# Patient Record
Sex: Male | Born: 1995 | Hispanic: Yes | Marital: Married | Smoking: Never smoker
Health system: Southern US, Community
[De-identification: ages and names within clinical notes are randomized; demographics above are authoritative.]

## PROBLEM LIST (undated history)

## (undated) DIAGNOSIS — I82409 Acute embolism and thrombosis of unspecified deep veins of unspecified lower extremity: Secondary | ICD-10-CM

---

## 2021-05-31 ENCOUNTER — Inpatient Hospital Stay (HOSPITAL_COMMUNITY)
Admission: EM | Admit: 2021-05-31 | Discharge: 2021-06-07 | DRG: 914 | Disposition: A | Discharge status: Home Health | Payer: PRIVATE HEALTH INSURANCE | Attending: Orthopedic Surgery | Admitting: Orthopedic Surgery

## 2021-05-31 ENCOUNTER — Observation Stay (HOSPITAL_COMMUNITY): Payer: PRIVATE HEALTH INSURANCE

## 2021-05-31 ENCOUNTER — Encounter (HOSPITAL_COMMUNITY): Payer: Self-pay

## 2021-05-31 ENCOUNTER — Other Ambulatory Visit: Payer: Self-pay

## 2021-05-31 ENCOUNTER — Emergency Department (HOSPITAL_COMMUNITY): Payer: PRIVATE HEALTH INSURANCE

## 2021-05-31 DIAGNOSIS — Y990 Civilian activity done for income or pay: Secondary | ICD-10-CM

## 2021-05-31 DIAGNOSIS — D62 Acute posthemorrhagic anemia: Secondary | ICD-10-CM | POA: Diagnosis present

## 2021-05-31 DIAGNOSIS — M79659 Pain in unspecified thigh: Secondary | ICD-10-CM | POA: Diagnosis not present

## 2021-05-31 DIAGNOSIS — M6282 Rhabdomyolysis: Secondary | ICD-10-CM | POA: Diagnosis present

## 2021-05-31 DIAGNOSIS — S7712XA Crushing injury of left thigh, initial encounter: Secondary | ICD-10-CM | POA: Diagnosis not present

## 2021-05-31 DIAGNOSIS — S7012XA Contusion of left thigh, initial encounter: Secondary | ICD-10-CM | POA: Diagnosis present

## 2021-05-31 DIAGNOSIS — Z20822 Contact with and (suspected) exposure to covid-19: Secondary | ICD-10-CM | POA: Diagnosis present

## 2021-05-31 DIAGNOSIS — S7711XA Crushing injury of right thigh, initial encounter: Secondary | ICD-10-CM | POA: Diagnosis present

## 2021-05-31 DIAGNOSIS — S7011XA Contusion of right thigh, initial encounter: Secondary | ICD-10-CM | POA: Diagnosis present

## 2021-05-31 LAB — PROTIME-INR
INR: 1 (ref 0.8–1.2)
Prothrombin Time: 13.1 seconds (ref 11.4–15.2)

## 2021-05-31 LAB — COMPREHENSIVE METABOLIC PANEL
ALT: 27 U/L (ref 0–44)
AST: 26 U/L (ref 15–41)
Albumin: 3.6 g/dL (ref 3.5–5.0)
Alkaline Phosphatase: 58 U/L (ref 38–126)
Anion gap: 9 (ref 5–15)
BUN: 15 mg/dL (ref 6–20)
CO2: 24 mmol/L (ref 22–32)
Calcium: 8.9 mg/dL (ref 8.9–10.3)
Chloride: 104 mmol/L (ref 98–111)
Creatinine, Ser: 0.92 mg/dL (ref 0.61–1.24)
GFR, Estimated: >60 mL/min (ref >60)
Glucose, Bld: 142 mg/dL — ABNORMAL HIGH (ref 70–99)
Potassium: 3.4 mmol/L — ABNORMAL LOW (ref 3.5–5.1)
Sodium: 137 mmol/L (ref 135–145)
Total Bilirubin: 0.5 mg/dL (ref 0.3–1.2)
Total Protein: 6.9 g/dL (ref 6.5–8.1)

## 2021-05-31 LAB — URINALYSIS, ROUTINE W REFLEX MICROSCOPIC
Bilirubin Urine: NEGATIVE
Glucose, UA: NEGATIVE mg/dL
Hgb urine dipstick: NEGATIVE
Ketones, ur: NEGATIVE mg/dL
Leukocytes,Ua: NEGATIVE
Nitrite: NEGATIVE
Protein, ur: NEGATIVE mg/dL
Specific Gravity, Urine: >1.046 — ABNORMAL HIGH (ref 1.005–1.030)
pH: 5 (ref 5.0–8.0)

## 2021-05-31 LAB — I-STAT CHEM 8, ED
BUN: 17 mg/dL (ref 6–20)
Calcium, Ion: 1.03 mmol/L — ABNORMAL LOW (ref 1.15–1.40)
Chloride: 106 mmol/L (ref 98–111)
Creatinine, Ser: 0.9 mg/dL (ref 0.61–1.24)
Glucose, Bld: 136 mg/dL — ABNORMAL HIGH (ref 70–99)
HCT: 42 % (ref 39.0–52.0)
Hemoglobin: 14.3 g/dL (ref 13.0–17.0)
Potassium: 3.4 mmol/L — ABNORMAL LOW (ref 3.5–5.1)
Sodium: 140 mmol/L (ref 135–145)
TCO2: 24 mmol/L (ref 22–32)

## 2021-05-31 LAB — CBC
HCT: 41.8 % (ref 39.0–52.0)
Hemoglobin: 13.7 g/dL (ref 13.0–17.0)
MCH: 27.1 pg (ref 26.0–34.0)
MCHC: 32.8 g/dL (ref 30.0–36.0)
MCV: 82.6 fL (ref 80.0–100.0)
Platelets: 312 10*3/uL (ref 150–400)
RBC: 5.06 MIL/uL (ref 4.22–5.81)
RDW: 13.3 % (ref 11.5–15.5)
WBC: 16.9 10*3/uL — ABNORMAL HIGH (ref 4.0–10.5)
nRBC: 0 % (ref 0.0–0.2)

## 2021-05-31 LAB — RESP PANEL BY RT-PCR (FLU A&B, COVID) ARPGX2
Influenza A by PCR: NEGATIVE
Influenza B by PCR: NEGATIVE
SARS Coronavirus 2 by RT PCR: NEGATIVE

## 2021-05-31 LAB — LACTIC ACID, PLASMA
Lactic Acid, Venous: 3.2 mmol/L (ref 0.5–1.9)
Lactic Acid, Venous: 3.3 mmol/L (ref 0.5–1.9)
Lactic Acid, Venous: 3.4 mmol/L (ref 0.5–1.9)

## 2021-05-31 LAB — SAMPLE TO BLOOD BANK

## 2021-05-31 LAB — ETHANOL: Alcohol, Ethyl (B): <10 mg/dL (ref <10)

## 2021-05-31 LAB — CK: Total CK: 551 U/L — ABNORMAL HIGH (ref 49–397)
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
## 2021-05-31 IMAGING — DX DG PORTABLE PELVIS
1 series · 1 of 1 positions shown · non-contrast
Comparison: None.

CLINICAL DATA: Trauma, pain

EXAM:
PORTABLE PELVIS 1-2 VIEWS

[pelvis]
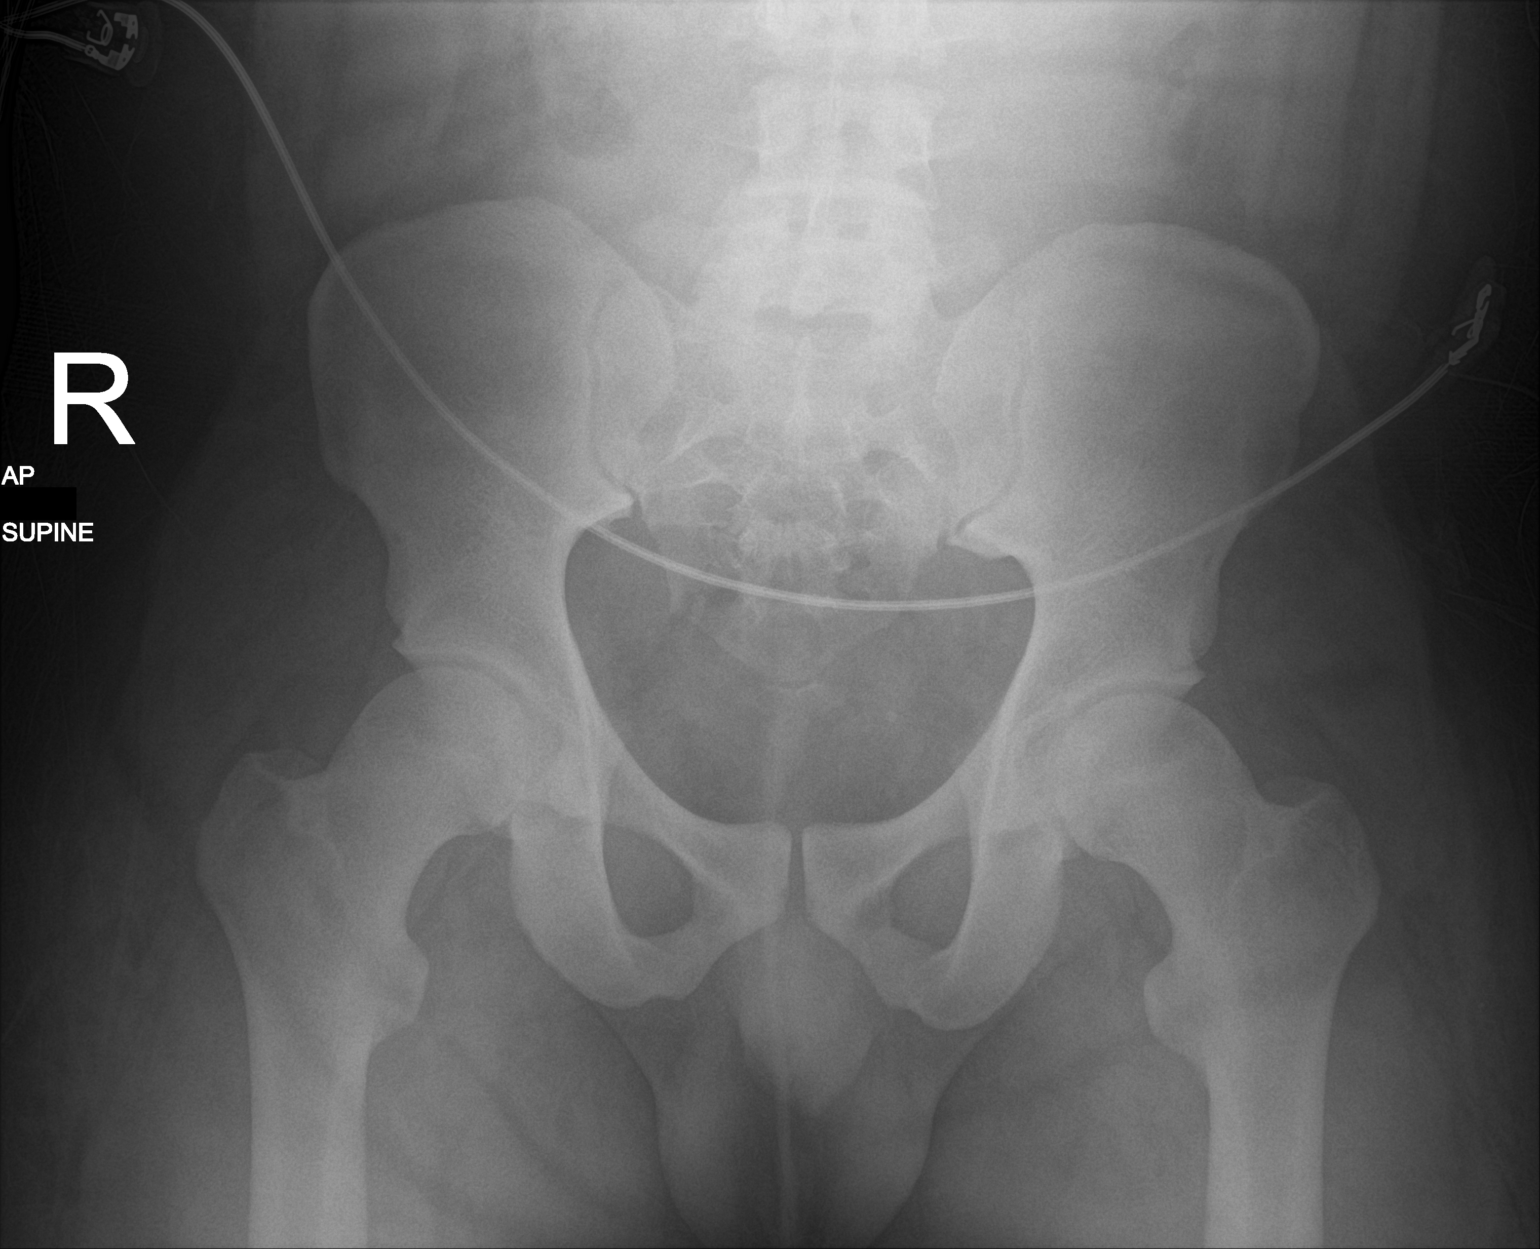

[1 of 1 positions shown; findings below may reference images not displayed]

FINDINGS: There is no evidence of pelvic fracture or diastasis. No pelvic bone
lesions are seen.
IMPRESSION: Negative.

## 2021-05-31 MED ORDER — SODIUM CHLORIDE 0.9 % IV BOLUS
1000.0000 mL | Freq: Once | INTRAVENOUS | Status: AC
  Administered 2021-05-31: 1000 mL via INTRAVENOUS

## 2021-05-31 MED ORDER — METHOCARBAMOL 1000 MG/10ML IJ SOLN
500.0000 mg | Freq: Three times a day (TID) | INTRAVENOUS | Status: DC
  Filled 2021-05-31: qty 5

## 2021-05-31 MED ORDER — TRAMADOL HCL 50 MG PO TABS
50.0000 mg | ORAL_TABLET | Freq: Three times a day (TID) | ORAL | Status: DC | PRN
  Administered 2021-06-02 – 2021-06-03 (×2): 50 mg via ORAL
  Filled 2021-05-31 (×2): qty 1
  Filled 2021-05-31: qty 2
  Filled 2021-05-31: qty 1

## 2021-05-31 MED ORDER — ACETAMINOPHEN 500 MG PO TABS
1000.0000 mg | ORAL_TABLET | Freq: Three times a day (TID) | ORAL | Status: DC
  Administered 2021-05-31 – 2021-06-07 (×16): 1000 mg via ORAL
  Filled 2021-05-31 (×17): qty 2

## 2021-05-31 MED ORDER — POTASSIUM CHLORIDE CRYS ER 20 MEQ PO TBCR
20.0000 meq | EXTENDED_RELEASE_TABLET | Freq: Once | ORAL | Status: AC
  Administered 2021-05-31: 20 meq via ORAL
  Filled 2021-05-31: qty 1

## 2021-05-31 MED ORDER — IOHEXOL 350 MG/ML SOLN
100.0000 mL | Freq: Once | INTRAVENOUS | Status: AC | PRN
  Administered 2021-05-31: 100 mL via INTRAVENOUS

## 2021-05-31 MED ORDER — HYDROCODONE-ACETAMINOPHEN 5-325 MG PO TABS
1.0000 | ORAL_TABLET | Freq: Once | ORAL | Status: AC
  Administered 2021-05-31: 1 via ORAL
  Filled 2021-05-31: qty 1

## 2021-05-31 MED ORDER — PREGABALIN 75 MG PO CAPS
75.0000 mg | ORAL_CAPSULE | Freq: Three times a day (TID) | ORAL | Status: DC
  Administered 2021-05-31 – 2021-06-07 (×19): 75 mg via ORAL
  Filled 2021-05-31 (×9): qty 1
  Filled 2021-05-31: qty 3
  Filled 2021-05-31 (×9): qty 1

## 2021-05-31 MED ORDER — METHOCARBAMOL 500 MG PO TABS
1000.0000 mg | ORAL_TABLET | Freq: Three times a day (TID) | ORAL | Status: DC
  Administered 2021-05-31 – 2021-06-07 (×19): 1000 mg via ORAL
  Filled 2021-05-31 (×19): qty 2

## 2021-05-31 MED ORDER — LACTATED RINGERS IV SOLN: INTRAVENOUS | Status: DC

## 2021-05-31 MED ORDER — FENTANYL CITRATE PF 50 MCG/ML IJ SOSY
100.0000 ug | PREFILLED_SYRINGE | Freq: Once | INTRAMUSCULAR | Status: AC
  Administered 2021-05-31: 100 ug via INTRAVENOUS
  Filled 2021-05-31: qty 2

## 2021-05-31 MED ORDER — HYDROCODONE-ACETAMINOPHEN 5-325 MG PO TABS
1.0000 | ORAL_TABLET | Freq: Four times a day (QID) | ORAL | Status: DC | PRN
  Administered 2021-06-01: 1 via ORAL
  Administered 2021-06-01: 2 via ORAL
  Administered 2021-06-03 – 2021-06-04 (×3): 1 via ORAL
  Administered 2021-06-05 – 2021-06-06 (×2): 2 via ORAL
  Filled 2021-05-31 (×2): qty 2
  Filled 2021-05-31: qty 1
  Filled 2021-05-31 (×3): qty 2
  Filled 2021-05-31 (×4): qty 1

## 2021-05-31 MED ORDER — ACETAMINOPHEN 325 MG PO TABS
325.0000 mg | ORAL_TABLET | Freq: Four times a day (QID) | ORAL | Status: DC | PRN
  Administered 2021-06-03: 650 mg via ORAL
  Filled 2021-05-31: qty 2

## 2021-05-31 MED ORDER — MORPHINE SULFATE (PF) 2 MG/ML IV SOLN
0.5000 mg | INTRAVENOUS | Status: DC | PRN
  Administered 2021-06-01 (×3): 1 mg via INTRAVENOUS
  Filled 2021-05-31 (×3): qty 1

## 2021-05-31 MED ORDER — FENTANYL CITRATE PF 50 MCG/ML IJ SOSY
50.0000 ug | PREFILLED_SYRINGE | Freq: Once | INTRAMUSCULAR | Status: AC
  Administered 2021-05-31: 50 ug via INTRAVENOUS
  Filled 2021-05-31: qty 1

## 2021-05-31 NOTE — ED Notes (Signed)
RN aware of pts BP.  
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
## 2021-05-31 NOTE — ED Notes (Signed)
Pt transported to CT ?

## 2021-05-31 NOTE — ED Notes (Signed)
PA at the bedside notified manual BP 98/72

## 2021-05-31 NOTE — ED Notes (Signed)
Notified PA about pt BP trending low.

## 2021-05-31 NOTE — Progress Notes (Signed)
[facility]  Re-evaluated pt  Overall doing ok  Pain tolerable, about 6/10 on left leg  Last pain meds were about 5 hours ago   Denies numbness or tingling in legs DPN, SPN, TN sensation intact B and symmetric Motor functions intact on R  Pain limits motor eval on L leg but looks to have + quad function (quad set) Unable to perform active hip flexion or SLR No pain out of proportion with passive motion of knee or hip on L   Both thighs are soft  He does appear to have closed degloving injuries to both thighs but soft tissue envelope is stable B and looks healthy  CT L thigh negative for vascular injury, nor is there a large hematoma present. Findings consistent with localized edema/hemorrhage  LA 3.3 CK 551 Bun and Cr: 17 and 0.90 U/A unremarkable  Will admit for pain control and therapies as well as for close monitoring IV fluids given crush injury with elevated CK  MR L thigh to eval for hip flexor and quad injury given mechanism and exam   Ok to eat   Wayne Latin, PA-C [phone] (C) 05/31/2021, 10:29 PM  [facility] [address] [place] [address] [phone] Wayne Hunter (F)

## 2021-05-31 NOTE — ED Notes (Signed)
Help get patient on the monitor did manual blood pressure it was 118/74  patient is resting with nurse at bedside
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
## 2021-05-31 NOTE — ED Notes (Signed)
Katy RN Trauma arrived

## 2021-05-31 NOTE — ED Notes (Signed)
Orthopedic Surgeon PA at the bedside

## 2021-06-01 LAB — RENAL FUNCTION PANEL
Albumin: 3.1 g/dL — ABNORMAL LOW (ref 3.5–5.0)
Anion gap: 7 (ref 5–15)
BUN: 15 mg/dL (ref 6–20)
CO2: 23 mmol/L (ref 22–32)
Calcium: 8.6 mg/dL — ABNORMAL LOW (ref 8.9–10.3)
Chloride: 103 mmol/L (ref 98–111)
Creatinine, Ser: 0.94 mg/dL (ref 0.61–1.24)
GFR, Estimated: >60 mL/min (ref >60)
Glucose, Bld: 149 mg/dL — ABNORMAL HIGH (ref 70–99)
Phosphorus: 3.9 mg/dL (ref 2.5–4.6)
Potassium: 4.1 mmol/L (ref 3.5–5.1)
Sodium: 133 mmol/L — ABNORMAL LOW (ref 135–145)

## 2021-06-01 LAB — CBC
HCT: 32.4 % — ABNORMAL LOW (ref 39.0–52.0)
Hemoglobin: 10.7 g/dL — ABNORMAL LOW (ref 13.0–17.0)
MCH: 27 pg (ref 26.0–34.0)
MCHC: 33 g/dL (ref 30.0–36.0)
MCV: 81.6 fL (ref 80.0–100.0)
Platelets: 206 10*3/uL (ref 150–400)
RBC: 3.97 MIL/uL — ABNORMAL LOW (ref 4.22–5.81)
RDW: 13.6 % (ref 11.5–15.5)
WBC: 9 10*3/uL (ref 4.0–10.5)
nRBC: 0 % (ref 0.0–0.2)

## 2021-06-01 LAB — SURGICAL PCR SCREEN
MRSA, PCR: NEGATIVE
Staphylococcus aureus: NEGATIVE

## 2021-06-01 LAB — CK: Total CK: 3111 U/L — ABNORMAL HIGH (ref 49–397)

## 2021-06-01 LAB — HIV ANTIBODY (ROUTINE TESTING W REFLEX): HIV Screen 4th Generation wRfx: NONREACTIVE

## 2021-06-01 MED ORDER — VITAMIN D 25 MCG (1000 UNIT) PO TABS
2000.0000 [IU] | ORAL_TABLET | Freq: Two times a day (BID) | ORAL | Status: DC
  Administered 2021-06-01 – 2021-06-07 (×12): 2000 [IU] via ORAL
  Filled 2021-06-01 (×11): qty 2

## 2021-06-01 MED ORDER — ASCORBIC ACID 500 MG PO TABS
1000.0000 mg | ORAL_TABLET | Freq: Every day | ORAL | Status: DC
  Administered 2021-06-01 – 2021-06-07 (×7): 1000 mg via ORAL
  Filled 2021-06-01 (×7): qty 2

## 2021-06-01 MED ORDER — CEFAZOLIN SODIUM-DEXTROSE 2-4 GM/100ML-% IV SOLN
2.0000 g | INTRAVENOUS | Status: AC
  Administered 2021-06-02: 2 g via INTRAVENOUS
  Filled 2021-06-01: qty 100

## 2021-06-01 MED ORDER — CALCIUM CITRATE 950 (200 CA) MG PO TABS
200.0000 mg | ORAL_TABLET | Freq: Two times a day (BID) | ORAL | Status: DC
  Administered 2021-06-01 – 2021-06-07 (×11): 200 mg via ORAL
  Filled 2021-06-01 (×14): qty 1
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
## 2021-06-01 NOTE — Progress Notes (Signed)
OT Cancellation Note  Patient Details Name: SOHAIL CAPRARO MRN: [id number] DOB: 18-Jun-1996   Cancelled Treatment:    Reason Eval/Treat Not Completed: Patient not medically ready.  Possible I&D this date with difficulty participating.  OT to hold off for possible surgery, and assess next date.    Nadezhda Pollitt D Maigan Bittinger 06/01/2021, 2:59 PM

## 2021-06-01 NOTE — ED Notes (Signed)
RN attempted to call report to [phone] [phone]

## 2021-06-01 NOTE — Plan of Care (Signed)
  Problem: Clinical Measurements: Goal: Ability to maintain clinical measurements within normal limits will improve Outcome: Progressing Goal: Will remain free from infection Outcome: Progressing Goal: Diagnostic test results will improve Outcome: Progressing Goal: Respiratory complications will improve Outcome: Progressing Goal: Cardiovascular complication will be avoided Outcome: Progressing   Problem: Elimination: Goal: Will not experience complications related to bowel motility Outcome: Progressing Goal: Will not experience complications related to urinary retention Outcome: Progressing   Problem: Pain Managment: Goal: General experience of comfort will improve Outcome: Progressing   Problem: Safety: Goal: Ability to remain free from injury will improve Outcome: Progressing   

## 2021-06-01 NOTE — Progress Notes (Addendum)
Patient made aware he is NPO now. Last food intake 1100. Pain mild, pulses 2+. Patient comfortable at the moment.   *Per patient at 1330: before 0900 he has one sausage and a cup of cranberry juice. Then before 1100 he had chicken noodle soup, half of bread and a sip of juice from [employer] before RN came in to till him he was NPO around 1100.
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
## 2021-06-02 ENCOUNTER — Encounter (HOSPITAL_COMMUNITY)
Admission: EM | Disposition: A | Discharge status: Home Health | Payer: Self-pay | Source: Home / Self Care | Attending: Orthopedic Surgery

## 2021-06-02 ENCOUNTER — Observation Stay (HOSPITAL_COMMUNITY): Payer: PRIVATE HEALTH INSURANCE | Admitting: Certified Registered Nurse Anesthetist

## 2021-06-02 ENCOUNTER — Encounter (HOSPITAL_COMMUNITY): Payer: Self-pay | Admitting: Orthopedic Surgery

## 2021-06-02 DIAGNOSIS — Y990 Civilian activity done for income or pay: Secondary | ICD-10-CM | POA: Diagnosis not present

## 2021-06-02 DIAGNOSIS — S7712XA Crushing injury of left thigh, initial encounter: Secondary | ICD-10-CM | POA: Diagnosis present

## 2021-06-02 DIAGNOSIS — S7711XA Crushing injury of right thigh, initial encounter: Secondary | ICD-10-CM | POA: Diagnosis present

## 2021-06-02 DIAGNOSIS — S7011XA Contusion of right thigh, initial encounter: Secondary | ICD-10-CM | POA: Diagnosis present

## 2021-06-02 DIAGNOSIS — D62 Acute posthemorrhagic anemia: Secondary | ICD-10-CM | POA: Diagnosis not present

## 2021-06-02 DIAGNOSIS — M6282 Rhabdomyolysis: Secondary | ICD-10-CM | POA: Diagnosis present

## 2021-06-02 DIAGNOSIS — Z20822 Contact with and (suspected) exposure to covid-19: Secondary | ICD-10-CM | POA: Diagnosis present

## 2021-06-02 DIAGNOSIS — M79659 Pain in unspecified thigh: Secondary | ICD-10-CM | POA: Diagnosis present

## 2021-06-02 DIAGNOSIS — S7012XA Contusion of left thigh, initial encounter: Secondary | ICD-10-CM | POA: Diagnosis present

## 2021-06-02 HISTORY — PX: I & D EXTREMITY: SHX5045

## 2021-06-02 LAB — URINALYSIS, ROUTINE W REFLEX MICROSCOPIC
Bilirubin Urine: NEGATIVE
Glucose, UA: NEGATIVE mg/dL
Hgb urine dipstick: NEGATIVE
Ketones, ur: NEGATIVE mg/dL
Leukocytes,Ua: NEGATIVE
Nitrite: NEGATIVE
Protein, ur: NEGATIVE mg/dL
Specific Gravity, Urine: 1.021 (ref 1.005–1.030)
pH: 6 (ref 5.0–8.0)

## 2021-06-02 LAB — COMPREHENSIVE METABOLIC PANEL
ALT: 27 U/L (ref 0–44)
AST: 55 U/L — ABNORMAL HIGH (ref 15–41)
Albumin: 2.6 g/dL — ABNORMAL LOW (ref 3.5–5.0)
Alkaline Phosphatase: 44 U/L (ref 38–126)
Anion gap: 5 (ref 5–15)
BUN: 10 mg/dL (ref 6–20)
CO2: 26 mmol/L (ref 22–32)
Calcium: 8.1 mg/dL — ABNORMAL LOW (ref 8.9–10.3)
Chloride: 104 mmol/L (ref 98–111)
Creatinine, Ser: 0.88 mg/dL (ref 0.61–1.24)
GFR, Estimated: >60 mL/min (ref >60)
Glucose, Bld: 107 mg/dL — ABNORMAL HIGH (ref 70–99)
Potassium: 3.8 mmol/L (ref 3.5–5.1)
Sodium: 135 mmol/L (ref 135–145)
Total Bilirubin: 0.5 mg/dL (ref 0.3–1.2)
Total Protein: 5.4 g/dL — ABNORMAL LOW (ref 6.5–8.1)

## 2021-06-02 LAB — MAGNESIUM: Magnesium: 1.7 mg/dL (ref 1.7–2.4)

## 2021-06-02 LAB — CK: Total CK: 3734 U/L — ABNORMAL HIGH (ref 49–397)

## 2021-06-02 LAB — PHOSPHORUS: Phosphorus: 2.7 mg/dL (ref 2.5–4.6)

## 2021-06-02 LAB — LACTATE DEHYDROGENASE: LDH: 98 U/L (ref 98–192)

## 2021-06-02 SURGERY — IRRIGATION AND DEBRIDEMENT EXTREMITY
Anesthesia: General | Site: Leg Upper | Laterality: Bilateral

## 2021-06-02 MED ORDER — OXYCODONE HCL 5 MG/5ML PO SOLN: 5.0000 mg | Freq: Once | ORAL | Status: AC | PRN

## 2021-06-02 MED ORDER — MIDAZOLAM HCL 2 MG/2ML IJ SOLN
INTRAMUSCULAR | Status: DC | PRN
  Administered 2021-06-02: 2 mg via INTRAVENOUS

## 2021-06-02 MED ORDER — PHENYLEPHRINE 40 MCG/ML (10ML) SYRINGE FOR IV PUSH (FOR BLOOD PRESSURE SUPPORT)
PREFILLED_SYRINGE | INTRAVENOUS | Status: DC | PRN
  Administered 2021-06-02: 80 ug via INTRAVENOUS

## 2021-06-02 MED ORDER — ROCURONIUM BROMIDE 10 MG/ML (PF) SYRINGE
PREFILLED_SYRINGE | INTRAVENOUS | Status: DC | PRN
  Administered 2021-06-02: 50 mg via INTRAVENOUS

## 2021-06-02 MED ORDER — 0.9 % SODIUM CHLORIDE (POUR BTL) OPTIME
TOPICAL | Status: DC | PRN
  Administered 2021-06-02: 1000 mL

## 2021-06-02 MED ORDER — FENTANYL CITRATE (PF) 100 MCG/2ML IJ SOLN
INTRAMUSCULAR | Status: AC
  Filled 2021-06-02: qty 2

## 2021-06-02 MED ORDER — FENTANYL CITRATE (PF) 250 MCG/5ML IJ SOLN
INTRAMUSCULAR | Status: AC
  Filled 2021-06-02: qty 5

## 2021-06-02 MED ORDER — CHLORHEXIDINE GLUCONATE 0.12 % MT SOLN: 15.0000 mL | Freq: Once | OROMUCOSAL | Status: AC

## 2021-06-02 MED ORDER — CEFAZOLIN SODIUM-DEXTROSE 2-4 GM/100ML-% IV SOLN
2.0000 g | Freq: Three times a day (TID) | INTRAVENOUS | Status: AC
  Administered 2021-06-02 – 2021-06-03 (×3): 2 g via INTRAVENOUS
  Filled 2021-06-02 (×3): qty 100

## 2021-06-02 MED ORDER — LACTATED RINGERS IV SOLN: INTRAVENOUS | Status: DC | PRN

## 2021-06-02 MED ORDER — MIDAZOLAM HCL 2 MG/2ML IJ SOLN
INTRAMUSCULAR | Status: AC
  Filled 2021-06-02: qty 2

## 2021-06-02 MED ORDER — ONDANSETRON HCL 4 MG/2ML IJ SOLN
INTRAMUSCULAR | Status: AC
  Filled 2021-06-02: qty 2

## 2021-06-02 MED ORDER — ORAL CARE MOUTH RINSE: 15.0000 mL | Freq: Once | OROMUCOSAL | Status: AC

## 2021-06-02 MED ORDER — PROPOFOL 10 MG/ML IV BOLUS
INTRAVENOUS | Status: DC | PRN
  Administered 2021-06-02: 50 mg via INTRAVENOUS
  Administered 2021-06-02: 150 mg via INTRAVENOUS

## 2021-06-02 MED ORDER — LIDOCAINE 2% (20 MG/ML) 5 ML SYRINGE
INTRAMUSCULAR | Status: DC | PRN
  Administered 2021-06-02: 60 mg via INTRAVENOUS

## 2021-06-02 MED ORDER — FENTANYL CITRATE (PF) 100 MCG/2ML IJ SOLN
25.0000 ug | INTRAMUSCULAR | Status: DC | PRN
  Administered 2021-06-02 (×2): 50 ug via INTRAVENOUS

## 2021-06-02 MED ORDER — DEXAMETHASONE SODIUM PHOSPHATE 10 MG/ML IJ SOLN
INTRAMUSCULAR | Status: AC
  Filled 2021-06-02: qty 1

## 2021-06-02 MED ORDER — SUGAMMADEX SODIUM 200 MG/2ML IV SOLN
INTRAVENOUS | Status: DC | PRN
  Administered 2021-06-02: 200 mg via INTRAVENOUS

## 2021-06-02 MED ORDER — KETOROLAC TROMETHAMINE 30 MG/ML IJ SOLN
30.0000 mg | Freq: Once | INTRAMUSCULAR | Status: AC | PRN
  Administered 2021-06-02: 30 mg via INTRAVENOUS

## 2021-06-02 MED ORDER — ROCURONIUM BROMIDE 10 MG/ML (PF) SYRINGE
PREFILLED_SYRINGE | INTRAVENOUS | Status: AC
  Filled 2021-06-02: qty 10

## 2021-06-02 MED ORDER — SODIUM CHLORIDE 0.9 % IR SOLN
Status: DC | PRN
  Administered 2021-06-02: 3000 mL

## 2021-06-02 MED ORDER — OXYCODONE HCL 5 MG PO TABS
5.0000 mg | ORAL_TABLET | Freq: Once | ORAL | Status: AC | PRN
  Administered 2021-06-02: 5 mg via ORAL

## 2021-06-02 MED ORDER — LACTATED RINGERS IV SOLN: INTRAVENOUS | Status: DC

## 2021-06-02 MED ORDER — DEXAMETHASONE SODIUM PHOSPHATE 10 MG/ML IJ SOLN
INTRAMUSCULAR | Status: DC | PRN
  Administered 2021-06-02: 10 mg via INTRAVENOUS

## 2021-06-02 MED ORDER — FENTANYL CITRATE (PF) 250 MCG/5ML IJ SOLN
INTRAMUSCULAR | Status: DC | PRN
  Administered 2021-06-02: 100 ug via INTRAVENOUS
  Administered 2021-06-02 (×3): 50 ug via INTRAVENOUS

## 2021-06-02 MED ORDER — PROMETHAZINE HCL 25 MG/ML IJ SOLN: 6.2500 mg | INTRAMUSCULAR | Status: DC | PRN

## 2021-06-02 MED ORDER — KETOROLAC TROMETHAMINE 30 MG/ML IJ SOLN
INTRAMUSCULAR | Status: AC
  Filled 2021-06-02: qty 1

## 2021-06-02 MED ORDER — AMISULPRIDE (ANTIEMETIC) 5 MG/2ML IV SOLN: 10.0000 mg | Freq: Once | INTRAVENOUS | Status: DC | PRN

## 2021-06-02 MED ORDER — LIDOCAINE 2% (20 MG/ML) 5 ML SYRINGE
INTRAMUSCULAR | Status: AC
  Filled 2021-06-02: qty 5

## 2021-06-02 MED ORDER — PHENYLEPHRINE 40 MCG/ML (10ML) SYRINGE FOR IV PUSH (FOR BLOOD PRESSURE SUPPORT)
PREFILLED_SYRINGE | INTRAVENOUS | Status: AC
  Filled 2021-06-02: qty 10

## 2021-06-02 MED ORDER — PROPOFOL 10 MG/ML IV BOLUS
INTRAVENOUS | Status: AC
  Filled 2021-06-02: qty 40

## 2021-06-02 MED ORDER — OXYCODONE HCL 5 MG PO TABS
ORAL_TABLET | ORAL | Status: AC
  Filled 2021-06-02: qty 1

## 2021-06-02 MED ORDER — CHLORHEXIDINE GLUCONATE 0.12 % MT SOLN
OROMUCOSAL | Status: AC
  Administered 2021-06-02: 15 mL via OROMUCOSAL
  Filled 2021-06-02: qty 15

## 2021-06-02 MED ORDER — ONDANSETRON HCL 4 MG/2ML IJ SOLN
INTRAMUSCULAR | Status: DC | PRN
  Administered 2021-06-02: 4 mg via INTRAVENOUS

## 2021-06-02 SURGICAL SUPPLY — 48 items
BAG COUNTER SPONGE SURGICOUNT (BAG) IMPLANT
BNDG COHESIVE 4X5 TAN STRL (GAUZE/BANDAGES/DRESSINGS) ×2 IMPLANT
BNDG ELASTIC 6X15 VLCR STRL LF (GAUZE/BANDAGES/DRESSINGS) ×4 IMPLANT
BNDG GAUZE ELAST 4 BULKY (GAUZE/BANDAGES/DRESSINGS) ×4 IMPLANT
BRUSH SCRUB EZ PLAIN DRY (MISCELLANEOUS) ×4 IMPLANT
COVER SURGICAL LIGHT HANDLE (MISCELLANEOUS) ×4 IMPLANT
DRAIN CHANNEL 19F RND (DRAIN) ×4 IMPLANT
DRAPE U-SHAPE 47X51 STRL (DRAPES) ×2 IMPLANT
DRESSING MEPILEX FLEX 4X4 (GAUZE/BANDAGES/DRESSINGS) ×2 IMPLANT
DRSG ADAPTIC 3X8 NADH LF (GAUZE/BANDAGES/DRESSINGS) ×2 IMPLANT
DRSG MEPILEX FLEX 4X4 (GAUZE/BANDAGES/DRESSINGS) ×4
DRSG MEPITEL 4X7.2 (GAUZE/BANDAGES/DRESSINGS) ×2 IMPLANT
DRSG PAD ABDOMINAL 8X10 ST (GAUZE/BANDAGES/DRESSINGS) ×4 IMPLANT
ELECT REM PT RETURN 9FT ADLT (ELECTROSURGICAL)
ELECTRODE REM PT RTRN 9FT ADLT (ELECTROSURGICAL) IMPLANT
EVACUATOR SILICONE 100CC (DRAIN) ×4 IMPLANT
GAUZE SPONGE 4X4 12PLY STRL (GAUZE/BANDAGES/DRESSINGS) ×4 IMPLANT
GLOVE SRG 8 PF TXTR STRL LF DI (GLOVE) ×1 IMPLANT
GLOVE SURG ENC MOIS LTX SZ7.5 (GLOVE) ×2 IMPLANT
GLOVE SURG ENC MOIS LTX SZ8 (GLOVE) ×2 IMPLANT
GLOVE SURG UNDER POLY LF SZ7.5 (GLOVE) ×2 IMPLANT
GLOVE SURG UNDER POLY LF SZ8 (GLOVE) ×1
GLOVE SURG UNDER POLY LF SZ9 (GLOVE) ×2 IMPLANT
GOWN STRL REUS W/ TWL LRG LVL3 (GOWN DISPOSABLE) ×2 IMPLANT
GOWN STRL REUS W/ TWL XL LVL3 (GOWN DISPOSABLE) ×1 IMPLANT
GOWN STRL REUS W/TWL LRG LVL3 (GOWN DISPOSABLE) ×2
GOWN STRL REUS W/TWL XL LVL3 (GOWN DISPOSABLE) ×1
HANDPIECE INTERPULSE COAX TIP (DISPOSABLE)
KIT BASIN OR (CUSTOM PROCEDURE TRAY) ×2 IMPLANT
KIT TURNOVER KIT B (KITS) ×2 IMPLANT
MANIFOLD NEPTUNE II (INSTRUMENTS) ×2 IMPLANT
NS IRRIG 1000ML POUR BTL (IV SOLUTION) ×2 IMPLANT
PACK ORTHO EXTREMITY (CUSTOM PROCEDURE TRAY) ×2 IMPLANT
PAD ARMBOARD 7.5X6 YLW CONV (MISCELLANEOUS) ×4 IMPLANT
PADDING CAST COTTON 6X4 STRL (CAST SUPPLIES) ×4 IMPLANT
SET HNDPC FAN SPRY TIP SCT (DISPOSABLE) IMPLANT
SOL PREP POV-IOD 4OZ 10% (MISCELLANEOUS) ×2 IMPLANT
SOL PREP PROV IODINE SCRUB 4OZ (MISCELLANEOUS) ×2 IMPLANT
SPONGE T-LAP 18X18 ~~LOC~~+RFID (SPONGE) ×2 IMPLANT
STOCKINETTE IMPERVIOUS 9X36 MD (GAUZE/BANDAGES/DRESSINGS) IMPLANT
SUT ETHILON 2 0 FS 18 (SUTURE) ×4 IMPLANT
SUT PDS AB 2-0 CT1 27 (SUTURE) ×4 IMPLANT
TOWEL GREEN STERILE (TOWEL DISPOSABLE) ×4 IMPLANT
TOWEL GREEN STERILE FF (TOWEL DISPOSABLE) ×2 IMPLANT
TUBE CONNECTING 12X1/4 (SUCTIONS) ×2 IMPLANT
UNDERPAD 30X36 HEAVY ABSORB (UNDERPADS AND DIAPERS) ×2 IMPLANT
WATER STERILE IRR 1000ML POUR (IV SOLUTION) ×2 IMPLANT
YANKAUER SUCT BULB TIP NO VENT (SUCTIONS) ×2 IMPLANT

## 2021-06-02 NOTE — Transfer of Care (Signed)
Immediate Anesthesia Transfer of Care Note  Patient: Wayne Hunter  Procedure(s) Performed: IRRIGATION AND DEBRIDEMENT RIGHT AND LEFT THIGH (Bilateral: Leg Upper)  Patient Location: PACU  Anesthesia Type:General  Level of Consciousness: awake, alert  and oriented  Airway & Oxygen Therapy: Patient Spontanous Breathing  Post-op Assessment: Report given to RN, Post -op Vital signs reviewed and stable and Patient moving all extremities X 4  Post vital signs: Reviewed and stable  Last Vitals:  Vitals Value Taken Time  BP    Temp    Pulse 105 06/02/21 0956  Resp 27 06/02/21 0956  SpO2 99 % 06/02/21 0956  Vitals shown include unvalidated device data.  Last Pain:  Vitals:   06/02/21 0758  TempSrc:   PainSc: 3       Patients Stated Pain Goal: 3 (06/02/21 0758)  Complications: No notable events documented.

## 2021-06-02 NOTE — Progress Notes (Signed)
Pacu RN Report to floor given  Gave report to [employer]. 5N19. Discussed surgery, meds given in OR and Pacu, VS, IV fluids given, EBL, urine output, pain and other pertinent information. Also discussed if pt had any family or friends here or belongings with them.   Discussed 2 JP drains bilateral thighs. Gave Oxy 5mg , Toradol 30mg  and Fentanyl IV in Pacu. No belongings. Called and updated wife .   Pt exits my care.

## 2021-06-02 NOTE — Anesthesia Procedure Notes (Signed)
Procedure Name: Intubation Date/Time: 06/02/2021 8:41 AM Performed by: Nils Pyle, CRNA Pre-anesthesia Checklist: Patient identified, Emergency Drugs available, Suction available and Patient being monitored Patient Re-evaluated:Patient Re-evaluated prior to induction Oxygen Delivery Method: Circle System Utilized Preoxygenation: Pre-oxygenation with 100% oxygen Induction Type: IV induction Ventilation: Mask ventilation without difficulty Laryngoscope Size: Miller and 2 Grade View: Grade I Tube type: Oral Tube size: 7.5 mm Number of attempts: 1 Airway Equipment and Method: Stylet and Oral airway Placement Confirmation: ETT inserted through vocal cords under direct vision, positive ETCO2 and breath sounds checked- equal and bilateral Secured at: 23 cm Tube secured with: Tape Dental Injury: Teeth and Oropharynx as per pre-operative assessment

## 2021-06-02 NOTE — Progress Notes (Signed)
Inpatient Rehab Admissions Coordinator Note:   Per PT patient was screened for CIR candidacy by Zyra Parrillo Luvenia Starch, CCC-SLP. Pt had surgical procedure today. Will await therapy assessments post surgery to help determine candidacy for CIR. Will continue to follow.    Wolfgang Phoenix, MS, CCC-SLP Admissions Coordinator [phone] 06/02/21 2:23 PM

## 2021-06-02 NOTE — Progress Notes (Signed)
I have personally reviewed and discussed in detail with Montez Morita, PA-C, the patient's progress and confirmed the examination findings; I have also reviewed the photos and I formulated the plan for treatment which is outlined above.   I discussed with the patient the risks and benefits of surgery for incision and drainage of both thighs, including the possibility of infection, nerve injury, vessel injury, wound breakdown, arthritis, symptomatic hardware, DVT/ PE, loss of motion, malunion, nonunion, and need for further surgery among others. He acknowledged these risks and wished to proceed.  Myrene Galas, MD [facility], [facility] [phone]

## 2021-06-02 NOTE — Anesthesia Postprocedure Evaluation (Signed)
Anesthesia Post Note  Patient: Wayne Hunter  Procedure(s) Performed: IRRIGATION AND DEBRIDEMENT RIGHT AND LEFT THIGH (Bilateral: Leg Upper)     Patient location during evaluation: PACU Anesthesia Type: General Level of consciousness: awake and alert Pain management: pain level controlled Vital Signs Assessment: post-procedure vital signs reviewed and stable Respiratory status: spontaneous breathing, nonlabored ventilation, respiratory function stable and patient connected to nasal cannula oxygen Cardiovascular status: blood pressure returned to baseline and stable Postop Assessment: no apparent nausea or vomiting Anesthetic complications: no   No notable events documented.  Last Vitals:  Vitals:   06/02/21 1109 06/02/21 1511  BP: 138/74 137/73  Pulse: 97 (!) 103  Resp:  16  Temp: 37.3 C 36.8 C  SpO2: 100% 100%    Last Pain:  Vitals:   06/02/21 1636  TempSrc:   PainSc: 3                  [name] P Yves Fodor
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
## 2021-06-02 NOTE — Progress Notes (Signed)
OT Cancellation Note  Patient Details Name: CHAYTON MURATA MRN: [id number] DOB: Nov 08, 1995   Cancelled Treatment:    Reason Eval/Treat Not Completed: Patient at procedure or test/ unavailable. Pt off floor for procedure. Will follow up as able.  Hillard Danker, OT/L  Acute Rehab [phone]  Lenice Llamas 06/02/2021, 9:43 AM
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
## 2021-06-03 ENCOUNTER — Encounter (HOSPITAL_COMMUNITY): Payer: Self-pay | Admitting: Orthopedic Surgery

## 2021-06-03 LAB — CK: Total CK: 2685 U/L — ABNORMAL HIGH (ref 49–397)

## 2021-06-03 LAB — CBC
HCT: 24.7 % — ABNORMAL LOW (ref 39.0–52.0)
Hemoglobin: 8.1 g/dL — ABNORMAL LOW (ref 13.0–17.0)
MCH: 26.9 pg (ref 26.0–34.0)
MCHC: 32.8 g/dL (ref 30.0–36.0)
MCV: 82.1 fL (ref 80.0–100.0)
Platelets: 181 10*3/uL (ref 150–400)
RBC: 3.01 MIL/uL — ABNORMAL LOW (ref 4.22–5.81)
RDW: 13.7 % (ref 11.5–15.5)
WBC: 11.9 10*3/uL — ABNORMAL HIGH (ref 4.0–10.5)
nRBC: 0 % (ref 0.0–0.2)

## 2021-06-03 LAB — ALDOLASE: Aldolase: 8.4 U/L (ref 3.3–10.3)

## 2021-06-03 LAB — MYOGLOBIN, URINE: Myoglobin, Ur: 5 ng/mL (ref 0–13)

## 2021-06-03 NOTE — Plan of Care (Signed)
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
## 2021-06-03 NOTE — TOC CAGE-AID Note (Signed)
Transition of Care [facility]) - CAGE-AID Screening   Patient Details  Name: Wayne Hunter MRN: [id number] Date of Birth: 09-10-1995  Clinical Narrative:  Patient denies any alcohol or drug use, no need for resources at this time.  CAGE-AID Screening:    Have You Ever Felt You Ought to Cut Down on Your Drinking or Drug Use?: No Have People Annoyed You By Critizing Your Drinking Or Drug Use?: No Have You Felt Bad Or Guilty About Your Drinking Or Drug Use?: No Have You Ever Had a Drink or Used Drugs First Thing In The Morning to Steady Your Nerves or to Get Rid of a Hangover?: No CAGE-AID Score: 0  Substance Abuse Education Offered: No

## 2021-06-03 NOTE — Progress Notes (Signed)
Inpatient Rehab Admissions Coordinator:   At this time we are recommending a CIR consult and I will place an order per our protocol.  Estill Dooms, PT, DPT Admissions Coordinator [phone] 06/03/21  1:30 PM

## 2021-06-03 NOTE — Progress Notes (Signed)
Patients complains of new onset of nerve pain in right hip area. Describes it as a "pinching sensation."
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
## 2021-06-04 ENCOUNTER — Encounter (HOSPITAL_COMMUNITY): Payer: Self-pay | Admitting: Orthopedic Surgery

## 2021-06-04 DIAGNOSIS — M6282 Rhabdomyolysis: Secondary | ICD-10-CM | POA: Diagnosis present

## 2021-06-04 DIAGNOSIS — S7711XA Crushing injury of right thigh, initial encounter: Secondary | ICD-10-CM

## 2021-06-04 HISTORY — DX: Crushing injury of right thigh, initial encounter: S77.11XA

## 2021-06-04 HISTORY — DX: Rhabdomyolysis: M62.82

## 2021-06-04 LAB — CK: Total CK: 2043 U/L — ABNORMAL HIGH (ref 49–397)

## 2021-06-04 NOTE — Progress Notes (Signed)
Orthopedic Tech Progress Note Patient Details:  Wayne Hunter 1995-12-11 [id number]  Went to apply OVER HEAD FRAME WITH TRAPEZE to patient bed, he was working with THERAPY at the time, patient refused stating " he could move just fine"  Patient ID: Wayne Hunter, male   DOB: October 28, 1995, 25 y.o.   MRN: [id number]  Donald Pore 06/04/2021, 12:22 PM
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
## 2021-06-04 NOTE — Progress Notes (Signed)
Inpatient Rehab Admissions Coordinator:   Met with patient and his wife at the bedside to discuss CIR recommendations, goals, and expectations.  They are hopeful for rehab admission, and I feel pt could reach intermittent mod I level with 7-10 day stay.  I spoke to worker's comp adjuster, Neoma Laming, to confirm we are assessing for rehab admission. They are aware and we will continue to follow for potential admission pending bed availability, likely late this week at the earliest.    Shann Medal, PT, DPT Admissions Coordinator [phone] 06/04/21  3:18 PM

## 2021-06-04 NOTE — Progress Notes (Addendum)
NCM spoke with pt @ bedside regarding workmans compensation claim. Pt states has receive claim information. Provided NCM with case # [id number], Supervisor, Clovis Riley [phone]. Pt works for [employer] in [place]. NCM called supervisor and inquired about claim information. Supervisor informed NCM claim information has been processed hoping to receive details today. TOC team will continue to monitor. Gae Gallop RN,BSN,CM  06/04/2021 1300 NCM received  call from Pam ( workman comp. CM)/ [employer],Fax # [phone]. Adjuster: Beau Fanny, [phone]. Claim # [id number]. [employer] [address] [place] ,[place] [address]  06/04/2021 1422 Clinicals faxed to [facility] ( workman comp. CM)/ [employer] @ [phone] by NCM.
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
## 2021-06-04 NOTE — Progress Notes (Signed)
  Mobility Specialist Criteria Algorithm Info.  06/04/21 1614  Mobility  Activity Dangled on edge of bed;Stood at bedside;Sat and stood x 3  Range of Motion/Exercises Active;All extremities  Level of Assistance Minimal assist, patient does 75% or more (Min A sit<>stand)  Assistive Device Front wheel walker  RLE Weight Bearing WBAT  Mobility Response Tolerated well  Mobility performed by Mobility specialist  Bed Position Semi-fowlers  Hygiene  Hygiene Bath  Level of Assistance Minimal assist  Linen Change Gown changed    06/04/2021 4:16 PM
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
## 2021-06-05 LAB — CK: Total CK: 1146 U/L — ABNORMAL HIGH (ref 49–397)

## 2021-06-05 MED ORDER — RIVAROXABAN 10 MG PO TABS
10.0000 mg | ORAL_TABLET | Freq: Every day | ORAL | Status: DC
  Administered 2021-06-05 – 2021-06-06 (×2): 10 mg via ORAL
  Filled 2021-06-05 (×2): qty 1

## 2021-06-05 MED ORDER — KETOROLAC TROMETHAMINE 10 MG PO TABS
10.0000 mg | ORAL_TABLET | Freq: Three times a day (TID) | ORAL | Status: DC
  Administered 2021-06-05 – 2021-06-07 (×6): 10 mg via ORAL
  Filled 2021-06-05 (×8): qty 1
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
## 2021-06-05 NOTE — Progress Notes (Signed)
Inpatient Rehab Admissions Coordinator:   I will not have a bed for this patient on CIR today or tomorrow.  Will continue to follow.   Estill Dooms, PT, DPT Admissions Coordinator [phone] 06/05/21  9:06 AM
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
## 2021-06-05 NOTE — Plan of Care (Signed)

## 2021-06-05 NOTE — Progress Notes (Signed)
  Mobility Specialist Criteria Algorithm Info.  06/05/21 1059  Pain Assessment  Pain Score 10  Pain Location bilateral LE (L>R)  Pain Descriptors / Indicators Aching;Discomfort;Grimacing  Pain Intervention(s) Patient requesting pain meds-RN notified  Mobility  Activity Ambulated in room;Ambulated to bathroom  Range of Motion/Exercises Active;All extremities  Level of Assistance Minimal assist, patient does 75% or more  Assistive Device Front wheel walker  RLE Weight Bearing WBAT  LLE Weight Bearing WBAT  Distance Ambulated (ft) 15 ft  Mobility Ambulated with assistance in room  Mobility Response Tolerated well;RN notified  Mobility performed by Mobility specialist  Bed Position Chair      Patient received dangling EOB requesting assistance to restroom. Required min A sit<>stand + VC for safe hand placement. Upon standing pt complained of dizziness "faint", subsided after a minute of pursed lip breathing. Ambulated to bathroom at min guard with very slow steady gait. Required standing rest breaks x3 secondary to pain. Was left in bathroom with PT present. Tolerated ambulation well without incident.  06/05/2021 12:02 PM
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
## 2021-06-07 ENCOUNTER — Other Ambulatory Visit (HOSPITAL_COMMUNITY): Payer: Self-pay

## 2021-06-07 DIAGNOSIS — D62 Acute posthemorrhagic anemia: Secondary | ICD-10-CM | POA: Diagnosis present

## 2021-06-07 LAB — COMPREHENSIVE METABOLIC PANEL
ALT: 29 U/L (ref 0–44)
AST: 24 U/L (ref 15–41)
Albumin: 2.2 g/dL — ABNORMAL LOW (ref 3.5–5.0)
Alkaline Phosphatase: 47 U/L (ref 38–126)
Anion gap: 6 (ref 5–15)
BUN: 19 mg/dL (ref 6–20)
CO2: 26 mmol/L (ref 22–32)
Calcium: 8.4 mg/dL — ABNORMAL LOW (ref 8.9–10.3)
Chloride: 106 mmol/L (ref 98–111)
Creatinine, Ser: 0.88 mg/dL (ref 0.61–1.24)
GFR, Estimated: >60 mL/min (ref >60)
Glucose, Bld: 109 mg/dL — ABNORMAL HIGH (ref 70–99)
Potassium: 4 mmol/L (ref 3.5–5.1)
Sodium: 138 mmol/L (ref 135–145)
Total Bilirubin: 0.9 mg/dL (ref 0.3–1.2)
Total Protein: 5.2 g/dL — ABNORMAL LOW (ref 6.5–8.1)

## 2021-06-07 LAB — CK: Total CK: 462 U/L — ABNORMAL HIGH (ref 49–397)

## 2021-06-07 LAB — CBC
HCT: 23 % — ABNORMAL LOW (ref 39.0–52.0)
Hemoglobin: 7.4 g/dL — ABNORMAL LOW (ref 13.0–17.0)
MCH: 27.3 pg (ref 26.0–34.0)
MCHC: 32.2 g/dL (ref 30.0–36.0)
MCV: 84.9 fL (ref 80.0–100.0)
Platelets: 302 10*3/uL (ref 150–400)
RBC: 2.71 MIL/uL — ABNORMAL LOW (ref 4.22–5.81)
RDW: 14.7 % (ref 11.5–15.5)
WBC: 10.7 10*3/uL — ABNORMAL HIGH (ref 4.0–10.5)
nRBC: 0.4 % — ABNORMAL HIGH (ref 0.0–0.2)

## 2021-06-07 MED ORDER — ASCORBIC ACID 1000 MG PO TABS
1000.0000 mg | ORAL_TABLET | Freq: Every day | ORAL | 1 refills | Status: DC
  Filled 2021-06-07: qty 30, 30d supply, fill #0

## 2021-06-07 MED ORDER — METHOCARBAMOL 500 MG PO TABS
500.0000 mg | ORAL_TABLET | Freq: Four times a day (QID) | ORAL | 0 refills | Status: DC | PRN
  Filled 2021-06-07: qty 120, 15d supply, fill #0

## 2021-06-07 MED ORDER — CHOLECALCIFEROL 125 MCG (5000 UT) PO TABS
ORAL_TABLET | Freq: Every day | ORAL | 6 refills | Status: DC
  Filled 2021-06-07: qty 30, 30d supply, fill #0

## 2021-06-07 MED ORDER — RIVAROXABAN 15 MG PO TABS
15.0000 mg | ORAL_TABLET | Freq: Every day | ORAL | 0 refills | Status: DC
  Filled 2021-06-07: qty 30, 30d supply, fill #0

## 2021-06-07 MED ORDER — KETOROLAC TROMETHAMINE 10 MG PO TABS
10.0000 mg | ORAL_TABLET | Freq: Three times a day (TID) | ORAL | 0 refills | Status: AC
  Filled 2021-06-07: qty 9, 3d supply, fill #0

## 2021-06-07 MED ORDER — ACETAMINOPHEN 500 MG PO TABS
1000.0000 mg | ORAL_TABLET | Freq: Two times a day (BID) | ORAL | 0 refills | Status: DC
  Filled 2021-06-07: qty 60, 15d supply, fill #0

## 2021-06-07 MED ORDER — PREGABALIN 75 MG PO CAPS
75.0000 mg | ORAL_CAPSULE | Freq: Three times a day (TID) | ORAL | 0 refills | Status: DC
  Filled 2021-06-07: qty 90, 30d supply, fill #0

## 2021-06-07 MED ORDER — HYDROCODONE-ACETAMINOPHEN 5-325 MG PO TABS
1.0000 | ORAL_TABLET | Freq: Four times a day (QID) | ORAL | 0 refills | Status: DC | PRN
Start: 2021-06-07 — End: 2021-07-17
  Filled 2021-06-07: qty 50, 7d supply, fill #0

## 2021-06-07 MED ORDER — CALCIUM CITRATE 950 (200 CA) MG PO TABS
200.0000 mg | ORAL_TABLET | Freq: Two times a day (BID) | ORAL | 1 refills | Status: DC
  Filled 2021-06-07: qty 60, 30d supply, fill #0
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
## 2021-06-07 NOTE — TOC Transition Note (Signed)
Transition of Care [facility]) - CM/SW Discharge Note   Patient Details  Name: ROSCOE WITTS MRN: [id number] Date of Birth: Jan 24, 1996  [facility] CM/SW Contact:  Janae Bridgeman, RN Phone Number: 06/07/2021, 3:13 PM   Clinical Narrative:    Case management received a call from bedside nursing that patient discharged home before receiving dme.  I called and spoke with the patient and he was unaware that dme and home health would be arranged through worker's compensation company.  I called and spoke with Gentry Roch at [phone] and she will make arrangements for home health PT and will reimburse [employer] to deliver the rolling walker, wheelchair and 3:1 to be delivered to the patient's home.  I called [employer] and she will have arrangements made to deliver the equipment to the patient's home listed on the facesheet.  I emailed the discharge summary, home health orders and dme orders to the case manager, Gentry Roch at email - [email or web address] and also faxed to [phone].  The patient is aware and Pam shoemake to follow up with the patient at the home.         Patient Goals and CMS Choice        Discharge Placement                       Discharge Plan and Services                                     Social Determinants of Health (SDOH) Interventions     Readmission Risk Interventions No flowsheet data found.
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
## 2021-06-07 NOTE — Progress Notes (Signed)
Inpatient Rehab Admissions Coordinator:   I will not have a bed for this patient over the weekend.  Will f/u on Monday.    Estill Dooms, PT, DPT Admissions Coordinator [phone] 06/07/21  11:11 AM
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
## 2021-06-07 NOTE — Discharge Instructions (Addendum)
[facility]opa[facility]uma Service Discharge Instructions   General Discharge Instructions  Orthopaedic Injuries:  Bilateral thigh crush injuries treated with irrigation debridement and drain placement  WEIGHT BEARING STATUS: Weight-bear as tolerated both legs use walker or crutches to mobilize  RANGE OF MOTION/ACTIVITY: Unrestricted range of motion of hips and knees  Wound Care: Empty drains regularly and keep track of how much fluid is being put out each day.  Drains will usually remain in place until there is 30 to 40 cc of output.  Okay to remove Ace wrap periodically to check your skin but continue to use the compression wraps is much as possible to help limit further accumulation of fluid  DVT/PE prophylaxis: Xarelto 15 mg daily x 30 days  Diet: as you were eating previously.  Can use over the counter stool softeners and bowel preparations, such as Miralax, to help with bowel movements.  Narcotics can be constipating.  Be sure to drink plenty of fluids  PAIN MEDICATION USE AND EXPECTATIONS  You have likely been given narcotic medications to help control your pain.  After a traumatic event that results in an fracture (broken bone) with or without surgery, it is ok to use narcotic pain medications to help control one's pain.  We understand that everyone responds to pain differently and each individual patient will be evaluated on a regular basis for the continued need for narcotic medications. Ideally, narcotic medication use should last no more than 6-8 weeks (coinciding with fracture healing).   As a patient it is your responsibility as well to monitor narcotic medication use and report the amount and frequency you use these medications when you come to your office visit.   We would also advise that if you are using narcotic medications, you should take a dose prior to therapy to maximize you participation.  IF YOU ARE ON NARCOTIC MEDICATIONS IT IS NOT PERMISSIBLE TO OPERATE A MOTOR VEHICLE  (MOTORCYCLE/CAR/TRUCK/MOPED) OR HEAVY MACHINERY DO NOT MIX NARCOTICS WITH OTHER CNS (CENTRAL NERVOUS SYSTEM) DEPRESSANTS SUCH AS ALCOHOL   POST-OPERATIVE OPIOID TAPER INSTRUCTIONS: It is important to wean off of your opioid medication as soon as possible. If you do not need pain medication after your surgery it is ok to stop day one. Opioids include: Codeine, Hydrocodone(Norco, Vicodin), Oxycodone(Percocet, oxycontin) and hydromorphone amongst others.  Long term and even short term use of opiods can cause: Increased pain response Dependence Constipation Depression Respiratory depression And more.  Withdrawal symptoms can include Flu like symptoms Nausea, vomiting And more Techniques to manage these symptoms Hydrate well Eat regular healthy meals Stay active Use relaxation techniques(deep breathing, meditating, yoga) Do Not substitute Alcohol to help with tapering If you have been on opioids for less than two weeks and do not have pain than it is ok to stop all together.  Plan to wean off of opioids This plan should start within one week post op of your fracture surgery  Maintain the same interval or time between taking each dose and first decrease the dose.  Cut the total daily intake of opioids by one tablet each day Next start to increase the time between doses. The last dose that should be eliminated is the evening dose.    STOP SMOKING OR USING NICOTINE PRODUCTS!!!!  As discussed nicotine severely impairs your body's ability to heal surgical and traumatic wounds but also impairs bone healing.  Wounds and bone heal by forming microscopic blood vessels (angiogenesis) and nicotine is a vasoconstrictor (essentially, shrinks blood vessels).  Therefore, if vasoconstriction occurs to these microscopic blood vessels they essentially disappear and are unable to deliver necessary nutrients to the healing tissue.  This is one modifiable factor that you can do to dramatically increase your  chances of healing your injury.    (This means no smoking, no nicotine gum, patches, etc)     ICE AND ELEVATE INJURED/OPERATIVE EXTREMITY  Using ice and elevating the injured extremity above your heart can help with swelling and pain control.  Icing in a pulsatile fashion, such as 20 minutes on and 20 minutes off, can be followed.    Do not place ice directly on skin. Make sure there is a barrier between to skin and the ice pack.    Using frozen items such as frozen peas works well as the conform nicely to the are that needs to be iced.  USE AN ACE WRAP OR TED HOSE FOR SWELLING CONTROL  In addition to icing and elevation, Ace wraps or TED hose are used to help limit and resolve swelling.  It is recommended to use Ace wraps or TED hose until you are informed to stop.    When using Ace Wraps start the wrapping distally (farthest away from the body) and wrap proximally (closer to the body)   Example: If you had surgery on your leg or thing and you do not have a splint on, start the ace wrap at the toes and work your way up to the thigh        If you had surgery on your upper extremity and do not have a splint on, start the ace wrap at your fingers and work your way up to the upper arm  IF YOU ARE IN A SPLINT OR CAST DO NOT REMOVE IT FOR ANY REASON   If your splint gets wet for any reason please contact the office immediately. You may shower in your splint or cast as long as you keep it dry.  This can be done by wrapping in a cast cover or garbage back (or similar)  Do Not stick any thing down your splint or cast such as pencils, money, or hangers to try and scratch yourself with.  If you feel itchy take benadryl as prescribed on the bottle for itching  IF YOU ARE IN A CAM BOOT (BLACK BOOT)  You may remove boot periodically. Perform daily dressing changes as noted below.  Wash the liner of the boot regularly and wear a sock when wearing the boot. It is recommended that you sleep in the boot until  told otherwise    Call office for the following: Temperature greater than 101F Persistent nausea and vomiting Severe uncontrolled pain Redness, tenderness, or signs of infection (pain, swelling, redness, odor or green/yellow discharge around the site) Difficulty breathing, headache or visual disturbances Hives Persistent dizziness or light-headedness Extreme fatigue Any other questions or concerns you may have after discharge  In an emergency, call 911 or go to an Emergency Department at a nearby hospital  HELPFUL INFORMATION  If you had a block, it will wear off between 8-24 hrs postop typically.  This is period when your pain may go from nearly zero to the pain you would have had postop without the block.  This is an abrupt transition but nothing dangerous is happening.  You may take an extra dose of narcotic when this happens.  You should wean off your narcotic medicines as soon as you are able.  Most patients will be off  or using minimal narcotics before their first postop appointment.   We suggest you use the pain medication the first night prior to going to bed, in order to ease any pain when the anesthesia wears off. You should avoid taking pain medications on an empty stomach as it will make you nauseous.  Do not drink alcoholic beverages or take illicit drugs when taking pain medications.  In most states it is against the law to drive while you are in a splint or sling.  And certainly against the law to drive while taking narcotics.  You may return to work/school in the next couple of days when you feel up to it.   Pain medication may make you constipated.  Below are a few solutions to try in this order: Decrease the amount of pain medication if you aren't having pain. Drink lots of decaffeinated fluids. Drink prune juice and/or each dried prunes  If the first 3 don't work start with additional solutions Take Colace - an over-the-counter stool softener Take Senokot - an  over-the-counter laxative Take Miralax - a stronger over-the-counter laxative     CALL THE OFFICE WITH ANY QUESTIONS OR CONCERNS: [phone]   VISIT OUR WEBSITE FOR ADDITIONAL INFORMATION: [email or web address]

## 2021-06-21 ENCOUNTER — Telehealth: Payer: Self-pay | Admitting: Pharmacist

## 2021-06-21 NOTE — Telephone Encounter (Signed)
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 yes   Medication changes made at discharge:  - START: Xarelto 15mg   - STOPPED:   - CHANGED:   Medication changes verified by the patient? Yes    Medication Accessibility:  Home Pharmacy: n/a   Was the patient provided with refills on discharged medications? no   Have all prescriptions been transferred from [facility] to home pharmacy? N/a   Is the patient able to afford medications? N/a Notable copays: n/a Eligible patient assistance: n/a    Medication Review: RIVAROXABAN (XARELTO)  Rivaroxaban 15 mg BID initiated on 06/07/21.  - Discussed importance of taking medication with food and around the same time everyday  - Reviewed potential DDIs with patient  - Advised patient of medications to avoid (NSAIDs, ASA)  - Educated that Tylenol (acetaminophen) will be the preferred analgesic to prevent risk of bleeding  - Emphasized importance of monitoring for signs and symptoms of bleeding (abnormal bruising, prolonged bleeding, nose bleeds, bleeding from gums, discolored urine, black tarry stools)  - Advised patient to alert all providers of anticoagulation therapy prior to starting a new medication or having a procedure   Follow-up Appointments:  [facility] f[facility]u appt confirmed? Yes. Patient has already been seen as follow up.  If their condition worsens, is the pt aware to call PCP or go to the Emergency Dept.? yes  Final Patient Assessment: During follow up visit patient reported Xarelto has been discontinued.

## 2021-06-26 ENCOUNTER — Other Ambulatory Visit: Payer: Self-pay

## 2021-06-26 ENCOUNTER — Ambulatory Visit (HOSPITAL_COMMUNITY)
Admission: RE | Admit: 2021-06-26 | Discharge: 2021-06-26 | Disposition: A | Discharge status: Home / Self Care | Payer: PRIVATE HEALTH INSURANCE | Source: Ambulatory Visit | Attending: Orthopedic Surgery | Admitting: Orthopedic Surgery

## 2021-06-26 ENCOUNTER — Other Ambulatory Visit (HOSPITAL_COMMUNITY): Payer: Self-pay

## 2021-06-26 ENCOUNTER — Encounter (HOSPITAL_COMMUNITY): Payer: Self-pay

## 2021-06-26 ENCOUNTER — Other Ambulatory Visit (HOSPITAL_COMMUNITY): Payer: Self-pay | Admitting: Orthopedic Surgery

## 2021-06-26 ENCOUNTER — Observation Stay: Admit: 2021-06-26 | Payer: Medicaid Other | Source: Ambulatory Visit | Admitting: Orthopedic Surgery

## 2021-06-26 DIAGNOSIS — M79605 Pain in left leg: Secondary | ICD-10-CM | POA: Diagnosis not present

## 2021-06-26 DIAGNOSIS — M7989 Other specified soft tissue disorders: Secondary | ICD-10-CM | POA: Insufficient documentation

## 2021-06-26 DIAGNOSIS — G5722 Lesion of femoral nerve, left lower limb: Secondary | ICD-10-CM

## 2021-06-26 IMAGING — MR MR FEMUR*L* WO/W CM
19 of 20 series · 37 of 40 positions shown · IV contrast (gadavist)
Comparison: Radiographs [DATE], CT angiography [DATE] and
MRI [DATE].
COMPARISON: Radiographs [DATE], CT angiography [DATE] and
MRI [DATE].

Addendum:
CLINICAL DATA: Crush injury 4 weeks prior. Lesion of femoral nerve,
left. Rhabdomyolysis.

EXAM:
MR OF THE LEFT LOWER EXTREMITY WITHOUT AND WITH CONTRAST
TECHNIQUE: Multiplanar, multisequence MR imaging of the left thigh was
performed both before and after administration of intravenous
contrast.
CONTRAST:  9mL GADAVIST GADOBUTROL 1 MMOL/ML IV SOLN

[Series 4: STIR · coronal · left · 4.0mm · 1.95mm/px · 2 of 35 slices shown (1 of 8)]
[im 1/35]
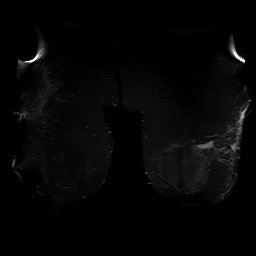
[im 35/35]
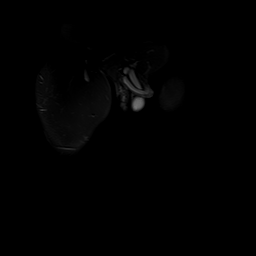

[Series 7: T1 · coronal · left · 4.5mm · 1.95mm/px · 2 of 32 slices shown (1 of 5)]
[im 1/32]
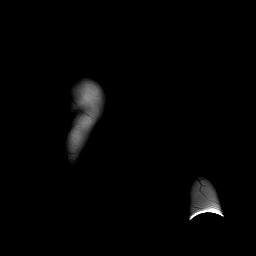
[im 32/32]
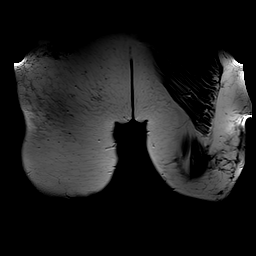

[Series 8: STIR · coronal · left · 4.5mm · 1.95mm/px · 2 of 31 slices shown (2 of 8)]
[im 1/31]
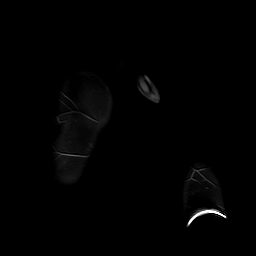
[im 31/31]
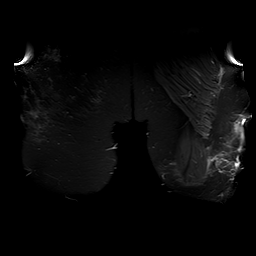

[Series 9: T1 · axial · left · 4.0mm · 1.02mm/px · z∈[-58,+187]mm · 2 of 50 slices shown (2 of 5)]
[im 1/50]
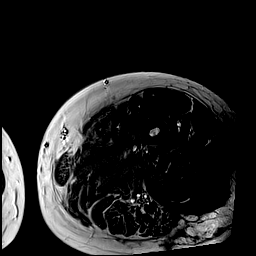
[im 50/50]
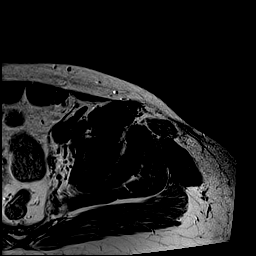

[Series 10: T1 · axial · left · 4.0mm · 1.02mm/px · z∈[-308,-63]mm · 2 of 50 slices shown (3 of 5)]
[im 1/50]
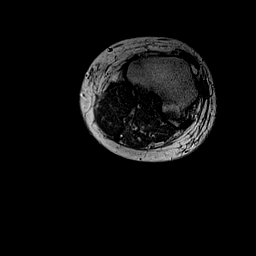
[im 50/50]
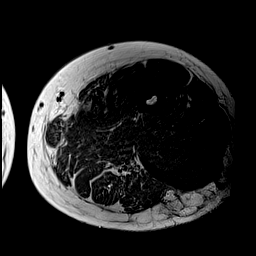

[Series 11: T2 fat-sat · axial · left · 4.0mm · 1.02mm/px · z∈[-58,+187]mm · 2 of 50 slices shown (1 of 2)]
[im 1/50]
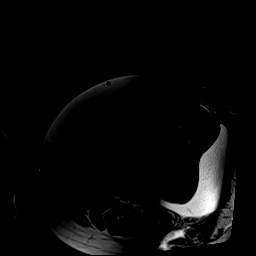
[im 50/50]
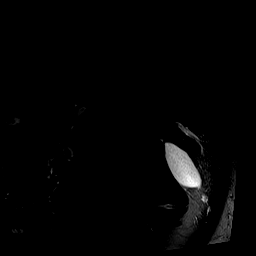

[Series 12: T1 · axial · left · 4.0mm · 1.02mm/px · z∈[-58,+187]mm · 2 of 50 slices shown (4 of 5)]
[im 1/50]
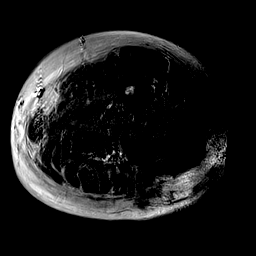
[im 50/50]
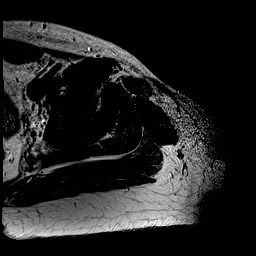

[Series 13: T2 fat-sat · axial · left · 4.0mm · 1.02mm/px · z∈[-306,-61]mm · 2 of 50 slices shown (2 of 2)]
[im 1/50]
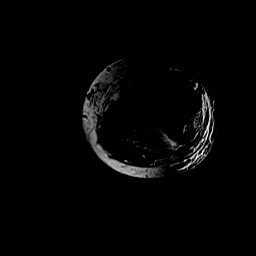
[im 50/50]
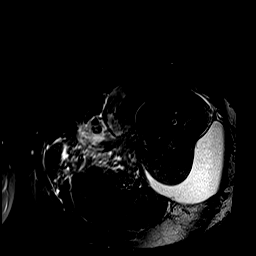

[Series 14: STIR · sagittal · left · 4.5mm · 1.56mm/px · 2 of 36 slices shown (3 of 8)]
[im 1/36]
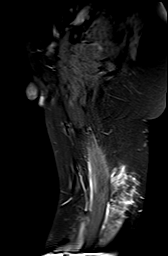
[im 36/36]
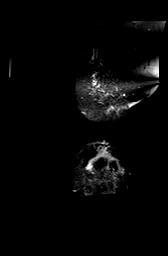

[Series 15: STIR · sagittal · left · 4.5mm · 1.56mm/px · 2 of 36 slices shown (4 of 8)]
[im 1/36]
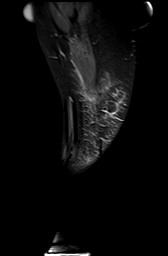
[im 36/36]
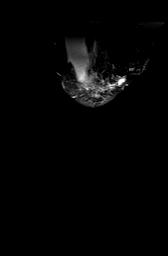

[Series 16: STIR · sagittal · left · 4.5mm · 1.56mm/px · 2 of 36 slices shown (5 of 8)]
[im 1/36]
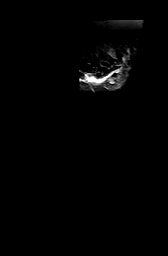
[im 36/36]
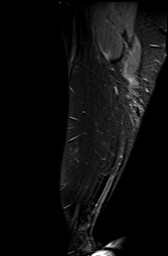

[Series 17: STIR · sagittal · left · 4.5mm · 1.41mm/px · 2 of 36 slices shown (6 of 8)]
[im 1/36]
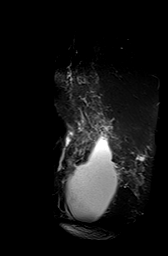
[im 36/36]
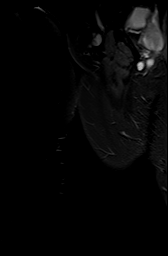

[Series 18: T1 · coronal · left · 4.5mm · 1.95mm/px · 2 of 32 slices shown (5 of 5)]
[im 1/32]
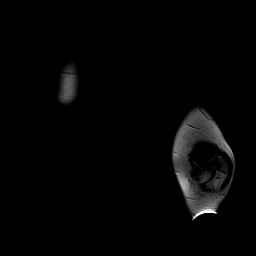
[im 32/32]
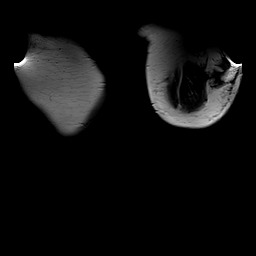

[Series 19: STIR · coronal · left · 4.5mm · 1.95mm/px · 2 of 32 slices shown (7 of 8)]
[im 1/32]
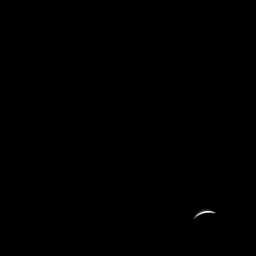
[im 32/32]
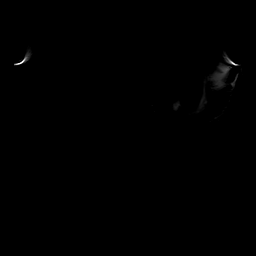

[Series 20: STIR · sagittal · left · 4.5mm · 1.41mm/px · 2 of 36 slices shown (8 of 8)]
[im 1/36]
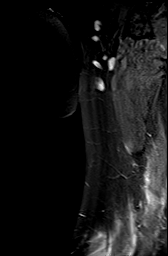
[im 36/36]
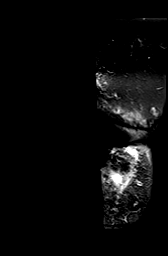

[Series 21: pre axial ti · axial · non-contrast · left · 4.0mm · 0.51mm/px · z∈[-58,+187]mm · 2 of 50 slices shown (1 of 2)]
[im 1/50]
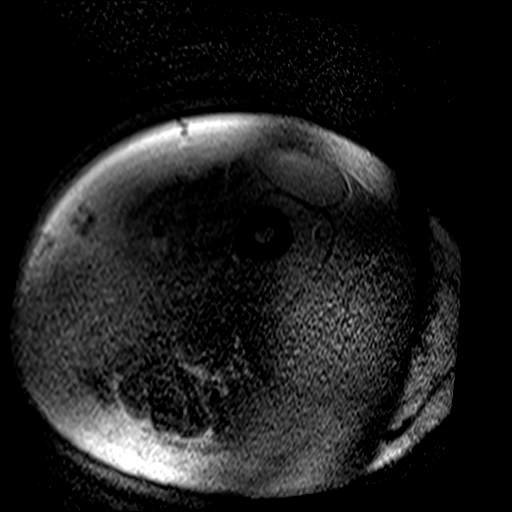
[im 50/50]
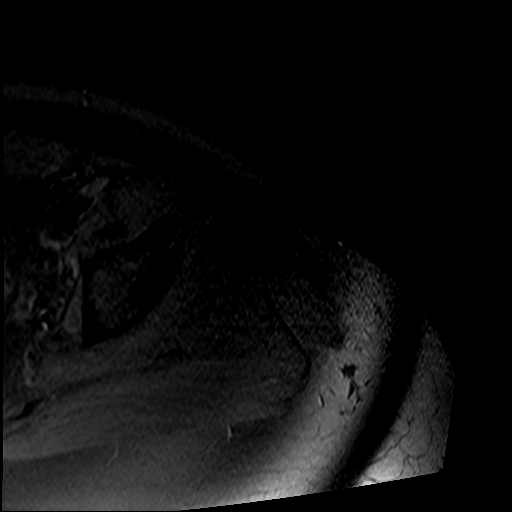

[Series 22: pre axial ti · axial · non-contrast · left · 4.0mm · 0.51mm/px · z∈[-58,+187]mm · 2 of 50 slices shown (2 of 2)]
[im 1/50]
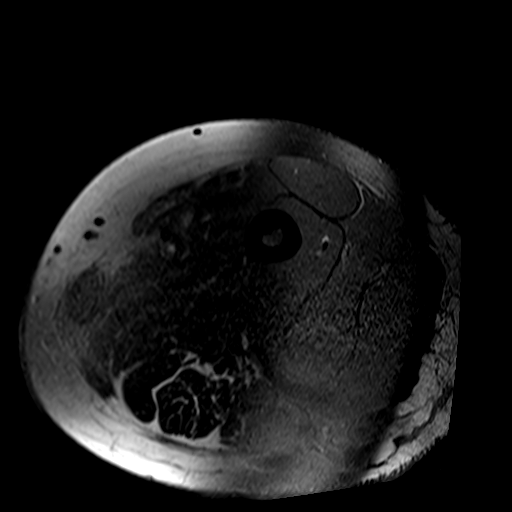
[im 50/50]
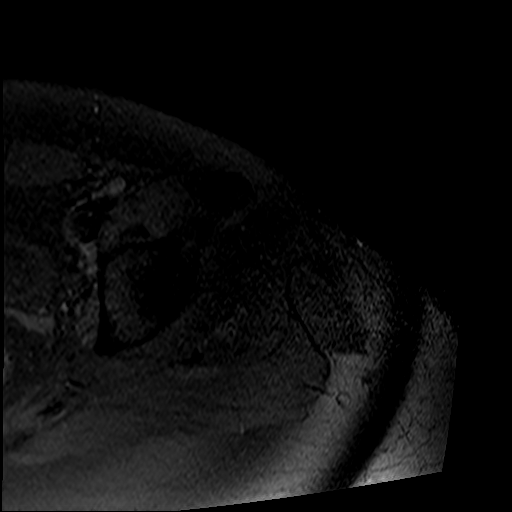

[Series 26: post axial ti · axial · left · 4.0mm · 0.51mm/px · z∈[+25,+220]mm · 2 of 40 slices shown (1 of 2)]
[im 1/40]
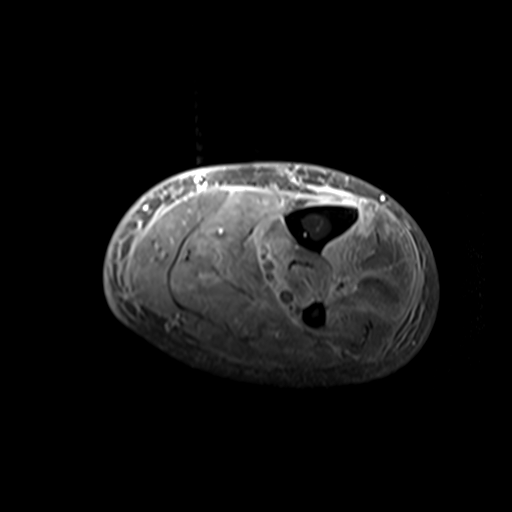
[im 40/40]
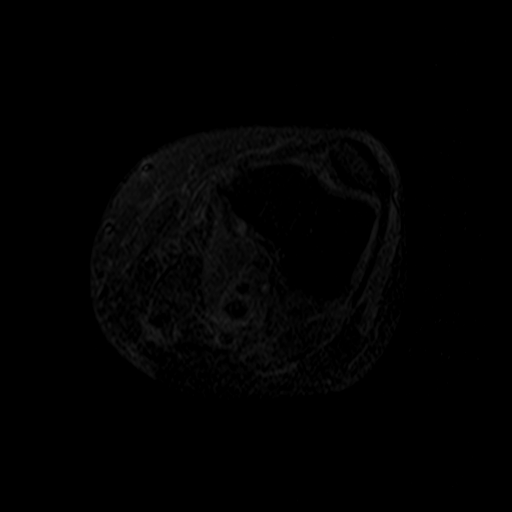

[Series 27: post axial ti · axial · left · 4.5mm · 0.51mm/px · 1 of 40 slices shown (2 of 2)]
[im 1/40]
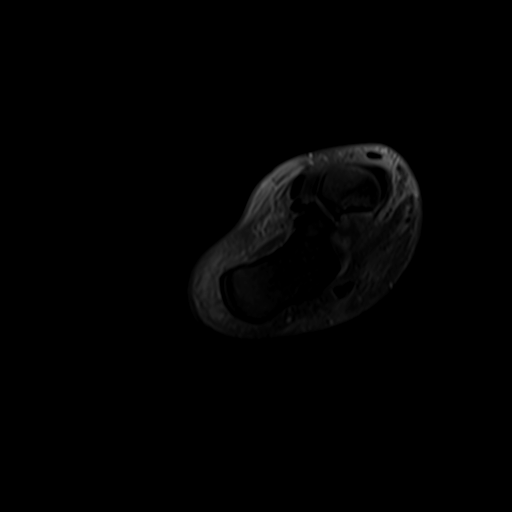

[37 of 40 positions shown; findings below may reference images not displayed]

FINDINGS: Bones/Joint/Cartilage

No evidence of acute fracture, dislocation or focal osseous lesion.
There is no abnormal marrow enhancement following contrast. No
significant left hip or left knee joint effusion.

Ligaments

Unremarkable.

Muscles and Tendons

There is increased edema and ill-defined fluid medially in the mid
to distal left thigh, involving the adductor musculature and the
vastus medialis muscle. There is also increased edema within the
biceps femoris muscle distally. There is associated fairly
homogeneous muscular enhancement in these regions. No full-thickness
tendon tear or retraction identified.

Soft tissues

Again demonstrated is a large superficial fluid collection laterally
in the left thigh superficial to the iliotibial band. This has
enlarged compared with the previous study, extending approximately
31 cm in length and measuring up to 10.1 x 3.5 cm transverse. This
previously demonstrated internal fluid-fluid levels which are less
evident. There is a small amount of peripheral enhancement following
contrast. As previously noted, there is a large similar appearing
fluid collection peripherally in the right thigh which is
incompletely visualized, although not significantly changed in size
from the previous MRI. There is diffuse perivascular edema
surrounding the femoral vessels which largely obscures the femoral
nerve. No definite nerve lesion is identified. However, there is
poor intraluminal venous enhancement, highly suspicious for
extensive femoropopliteal deep venous thrombosis on the left.
IMPRESSION: 1. Findings are highly suspicious for extensive left femoropopliteal
deep venous thrombosis. Associated extensive perivascular edema and
enhancement without focal fluid collection or definite abnormality
of the femoral nerve. Recommend correlation with lower extremity
venous Doppler ultrasound.
2. Associated perivascular muscular edema may be related or
indicative of muscular injury. Rhabdomyolysis could have a similar
appearance. No tendon tear identified.
3. Enlarging subcutaneous fluid collection laterally in the left
thigh consistent with DOINITA lesion. A similar contralateral
thigh fluid collection has not significantly changed from the
previous study.
4. No significant abnormality of the left femur or adjacent joints.

ADDENDUM:
Critical Value/emergent results were called by telephone at the time
of interpretation on [DATE] at [DATE] to provider DOINITA ,
who verbally acknowledged these results. The DVT is known and may
account for the perivascular and muscular edema.

*** End of Addendum ***
FINDINGS: Bones/Joint/Cartilage

No evidence of acute fracture, dislocation or focal osseous lesion.
There is no abnormal marrow enhancement following contrast. No
significant left hip or left knee joint effusion.

Ligaments

Unremarkable.

Muscles and Tendons

There is increased edema and ill-defined fluid medially in the mid
to distal left thigh, involving the adductor musculature and the
vastus medialis muscle. There is also increased edema within the
biceps femoris muscle distally. There is associated fairly
homogeneous muscular enhancement in these regions. No full-thickness
tendon tear or retraction identified.

Soft tissues

Again demonstrated is a large superficial fluid collection laterally
in the left thigh superficial to the iliotibial band. This has
enlarged compared with the previous study, extending approximately
31 cm in length and measuring up to 10.1 x 3.5 cm transverse. This
previously demonstrated internal fluid-fluid levels which are less
evident. There is a small amount of peripheral enhancement following
contrast. As previously noted, there is a large similar appearing
fluid collection peripherally in the right thigh which is
incompletely visualized, although not significantly changed in size
from the previous MRI. There is diffuse perivascular edema
surrounding the femoral vessels which largely obscures the femoral
nerve. No definite nerve lesion is identified. However, there is
poor intraluminal venous enhancement, highly suspicious for
extensive femoropopliteal deep venous thrombosis on the left.
IMPRESSION: 1. Findings are highly suspicious for extensive left femoropopliteal
deep venous thrombosis. Associated extensive perivascular edema and
enhancement without focal fluid collection or definite abnormality
of the femoral nerve. Recommend correlation with lower extremity
venous Doppler ultrasound.
2. Associated perivascular muscular edema may be related or
indicative of muscular injury. Rhabdomyolysis could have a similar
appearance. No tendon tear identified.
3. Enlarging subcutaneous fluid collection laterally in the left
thigh consistent with DOINITA lesion. A similar contralateral
thigh fluid collection has not significantly changed from the
previous study.
4. No significant abnormality of the left femur or adjacent joints.

## 2021-06-26 MED ORDER — ELIQUIS DVT/PE STARTER PACK 5 MG PO TBPK
ORAL_TABLET | ORAL | 0 refills | Status: DC
  Filled 2021-06-26: qty 74, 44d supply, fill #0
  Filled 2021-06-26: qty 74, 28d supply, fill #0

## 2021-06-26 MED ORDER — OXYCODONE-ACETAMINOPHEN 5-325 MG PO TABS
ORAL_TABLET | ORAL | 0 refills | Status: DC
  Filled 2021-06-26: qty 50, 9d supply, fill #0

## 2021-06-26 MED ORDER — CYCLOBENZAPRINE HCL 5 MG PO TABS
ORAL_TABLET | ORAL | 0 refills | Status: DC
  Filled 2021-06-26 (×2): qty 40, 13d supply, fill #0

## 2021-06-26 MED ORDER — GADOBUTROL 1 MMOL/ML IV SOLN: 9.0000 mL | Freq: Once | INTRAVENOUS | Status: DC | PRN

## 2021-06-26 NOTE — Progress Notes (Signed)
Lower extremity venous LT study completed.  Preliminary results relayed to Wayne Morita, PA. Patient given instructions regarding next steps and prescriptions.  See CV Proc for preliminary results report.   Jean Rosenthal, RDMS, RVT

## 2021-06-27 ENCOUNTER — Ambulatory Visit (HOSPITAL_COMMUNITY)
Admission: RE | Admit: 2021-06-27 | Discharge: 2021-06-27 | Disposition: A | Discharge status: Home / Self Care | Payer: PRIVATE HEALTH INSURANCE | Source: Ambulatory Visit | Attending: Orthopedic Surgery | Admitting: Orthopedic Surgery

## 2021-06-27 ENCOUNTER — Encounter: Payer: Self-pay | Admitting: Vascular Surgery

## 2021-06-27 ENCOUNTER — Other Ambulatory Visit (HOSPITAL_COMMUNITY): Payer: Self-pay

## 2021-06-27 ENCOUNTER — Ambulatory Visit (INDEPENDENT_AMBULATORY_CARE_PROVIDER_SITE_OTHER): Payer: PRIVATE HEALTH INSURANCE | Admitting: Vascular Surgery

## 2021-06-27 VITALS — BP 108/60 | HR 115 | Temp 99.8°F | Resp 20 | Ht 66.0 in | Wt 192.0 lb

## 2021-06-27 DIAGNOSIS — G5722 Lesion of femoral nerve, left lower limb: Secondary | ICD-10-CM | POA: Insufficient documentation

## 2021-06-27 DIAGNOSIS — I82412 Acute embolism and thrombosis of left femoral vein: Secondary | ICD-10-CM

## 2021-06-27 IMAGING — MR MR [PERSON_NAME] LOW WO/W CM*L*
12 of 13 series · 35 of 40 positions shown · IV contrast (gadavist)
Comparison: Left lower extremity CT angiography [DATE]. Left
thigh MRI [DATE].

CLINICAL DATA: Crush injury 4 weeks ago. Lesion of femoral nerve,
left. Rhabdomyolysis.

EXAM:
MRI OF LOWER LEFT EXTREMITY WITHOUT AND WITH CONTRAST
TECHNIQUE: Multiplanar, multisequence MR imaging of the left lower leg was
performed both before and after administration of intravenous
contrast.
CONTRAST:  9mL GADAVIST GADOBUTROL 1 MMOL/ML IV SOLN

[Series 5: T1 · coronal · left · 4.0mm · 1.95mm/px · 2 of 30 slices shown (1 of 4)]
[im 1/30]
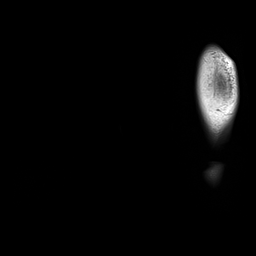
[im 30/30]
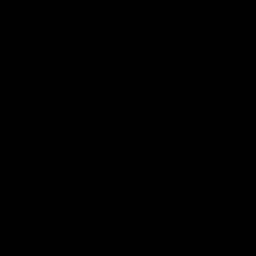

[Series 6: T1 · coronal · left · 4.0mm · 1.95mm/px · 2 of 30 slices shown (2 of 4)]
[im 1/30]
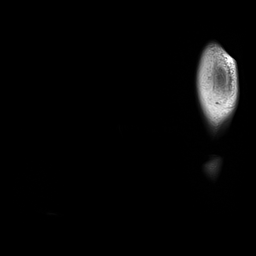
[im 30/30]
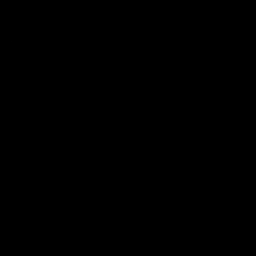

[Series 7: STIR · coronal · left · 4.0mm · 1.95mm/px · 2 of 30 slices shown (1 of 2)]
[im 1/30]
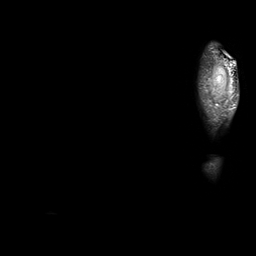
[im 30/30]
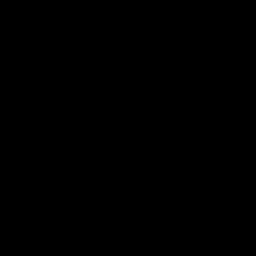

[Series 8: T1 · axial · left · 4.0mm · 1.02mm/px · z∈[-45,+170]mm · 3 of 44 slices shown (3 of 4)]
[im 1/44]
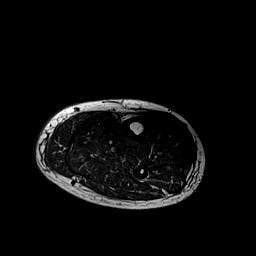
[im 22/44]
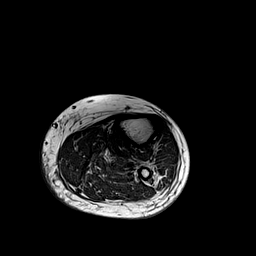
[im 44/44]
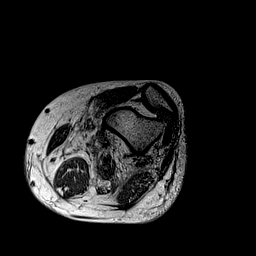

[Series 9: T1 · axial · left · 4.0mm · 1.02mm/px · z∈[-296,-61]mm · 4 of 48 slices shown (4 of 4)]
[im 1/48]
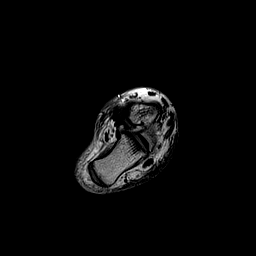
[im 16/48]
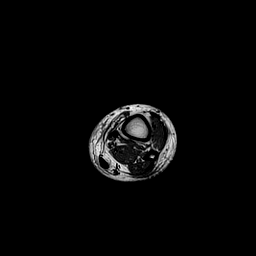
[im 32/48]
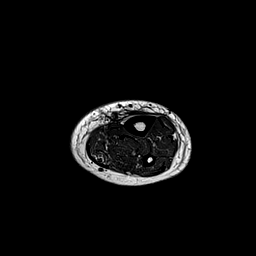
[im 48/48]
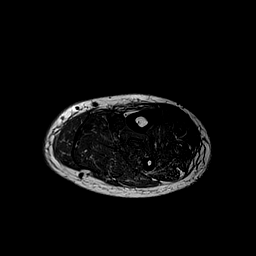

[Series 10: T2 fat-sat · axial · left · 4.0mm · 1.02mm/px · z∈[-45,+170]mm · 3 of 44 slices shown (1 of 2)]
[im 1/44]
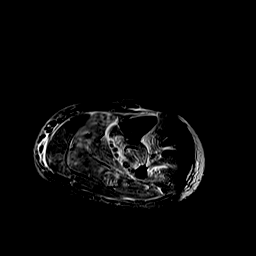
[im 22/44]
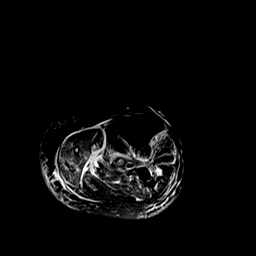
[im 44/44]
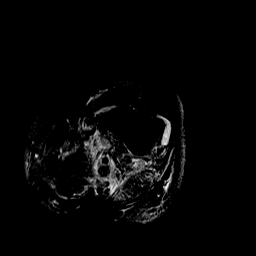

[Series 11: T2 fat-sat · axial · left · 4.0mm · 1.02mm/px · z∈[-296,-61]mm · 4 of 48 slices shown (2 of 2)]
[im 1/48]
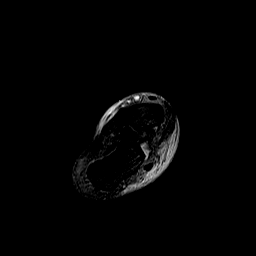
[im 16/48]
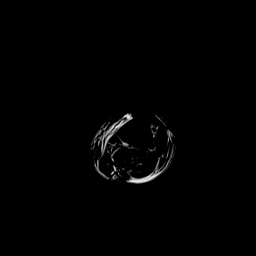
[im 32/48]
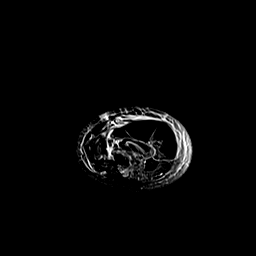
[im 48/48]
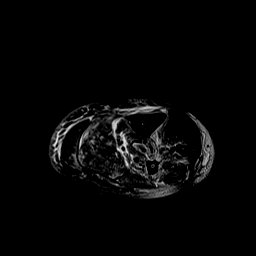

[Series 12: STIR · sagittal · left · 4.0mm · 1.72mm/px · 3 of 34 slices shown (2 of 2)]
[im 1/34]
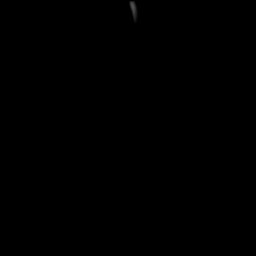
[im 17/34]
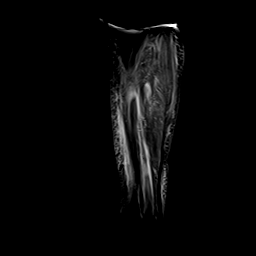
[im 34/34]
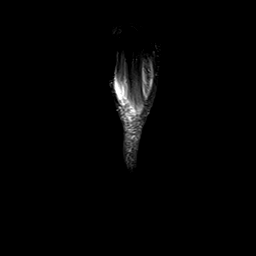

[Series 13: pre axial ti · axial · non-contrast · left · 4.0mm · 0.51mm/px · z∈[-45,+169]mm · 3 of 44 slices shown (1 of 2)]
[im 1/44]
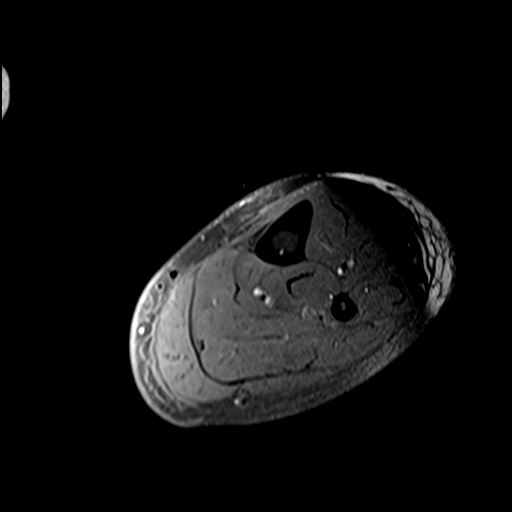
[im 22/44]
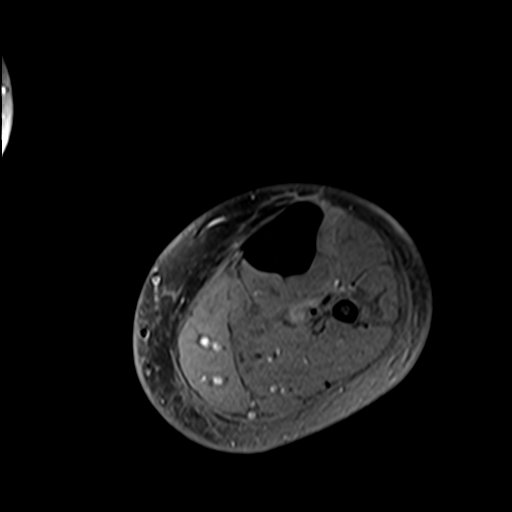
[im 44/44]
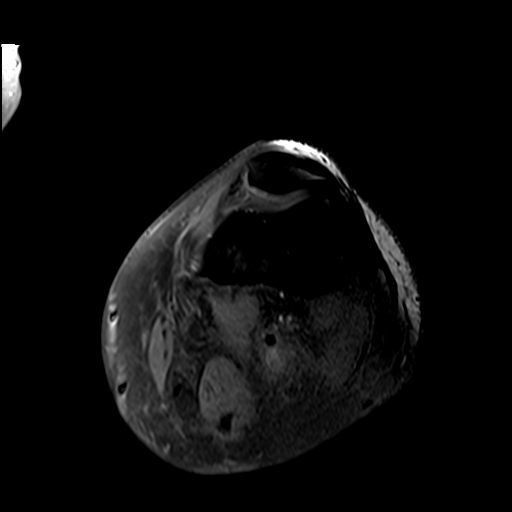

[Series 14: pre axial ti · axial · non-contrast · left · 4.0mm · 0.51mm/px · z∈[-296,-61]mm · 4 of 48 slices shown (2 of 2)]
[im 1/48]
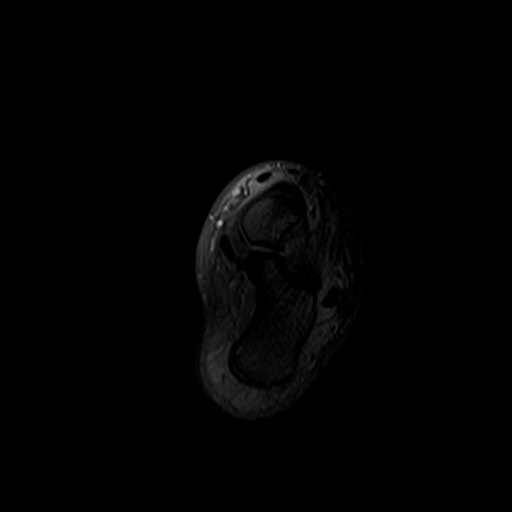
[im 16/48]
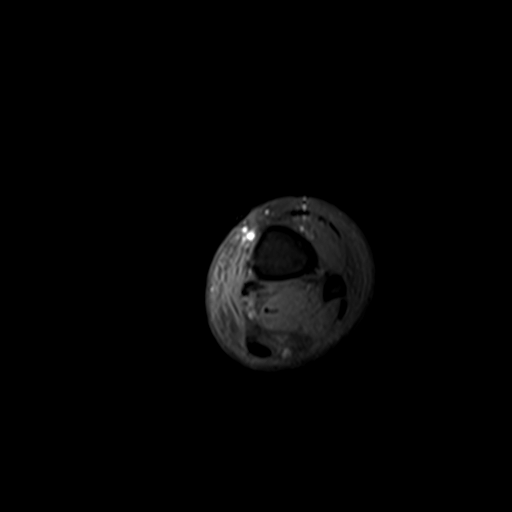
[im 32/48]
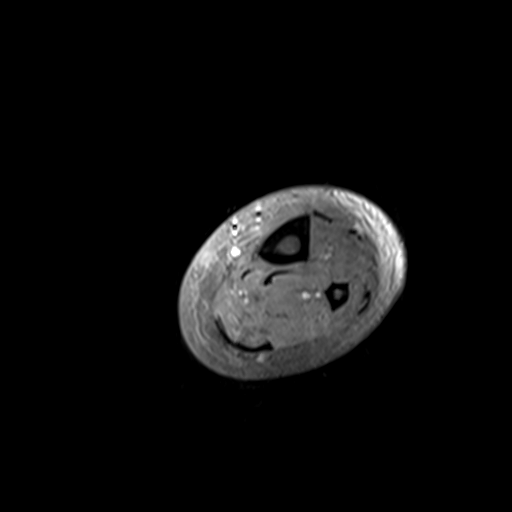
[im 48/48]
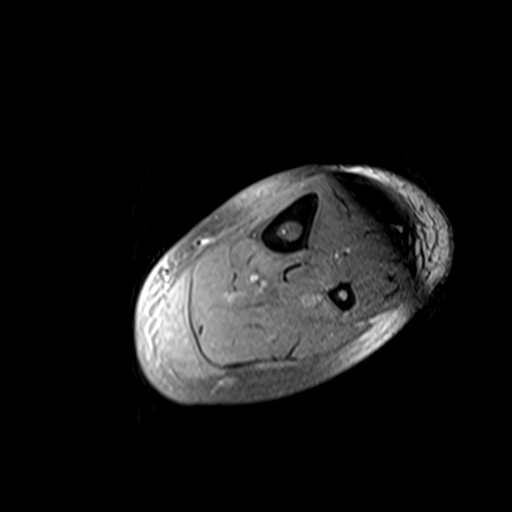

[Series 22: post axial ti · axial · left · 4.0mm · 0.51mm/px · z∈[+29,+270]mm · 4 of 50 slices shown]
[im 1/50]
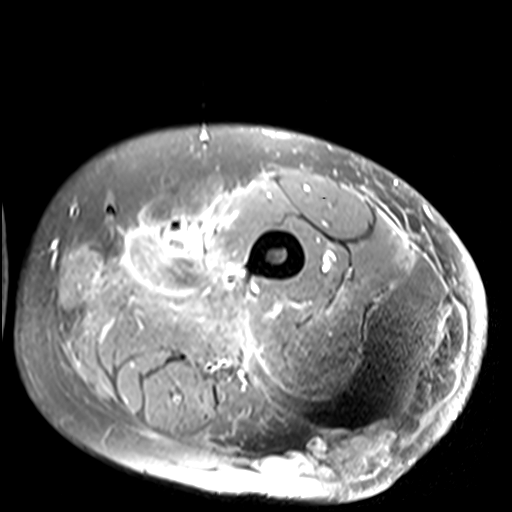
[im 17/50]
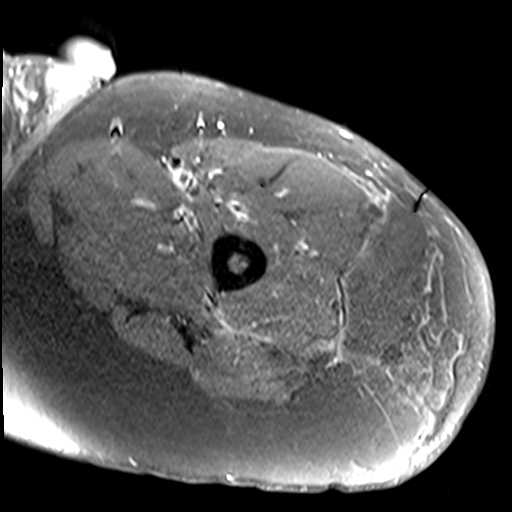
[im 33/50]
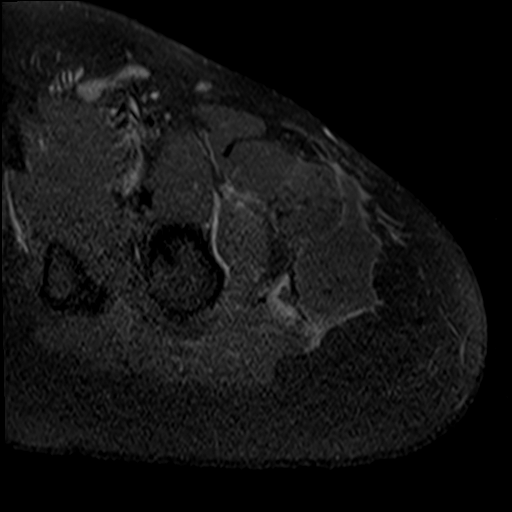
[im 50/50]
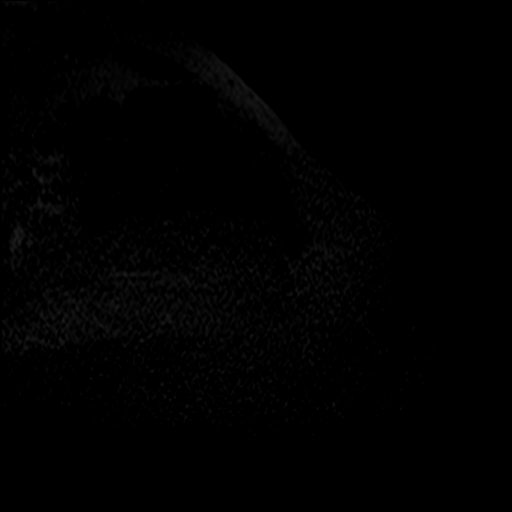

[Series 23: post sag ti · sagittal · left · 4.5mm · 0.78mm/px · 1 of 44 slices shown]
[im 1/44]
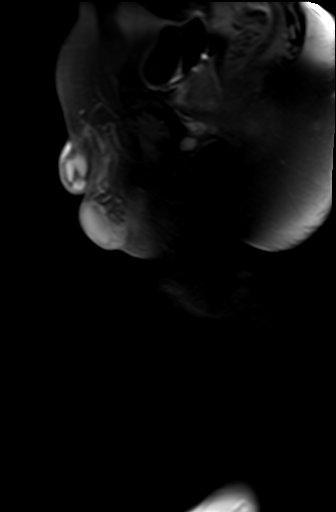

[35 of 40 positions shown; findings below may reference images not displayed]

FINDINGS: Left thigh findings are dictated separately.

Bones/Joint/Cartilage

No evidence of acute fracture, dislocation or focal osseous lesion.
No significant left knee or ankle joint effusion. No abnormal marrow
enhancement following contrast.

Ligaments

Although not optimally assessed, the cruciate and collateral
ligaments appear intact at the left knee.

Muscles and Tendons

Diffuse perivascular muscular edema within the left lower leg with
associated nonspecific low level enhancement. No focal fluid
collections are identified. No evidence of tendon tear.

Soft tissues

Diffuse perivascular enhancement surrounding the left
femoropopliteal and calf veins with poor intraluminal enhancement
highly suspicious for extensive femoropopliteal deep venous
thrombosis. Mild interfascial and subcutaneous edema without focal
fluid collection.
IMPRESSION: 1. As demonstrated on the separate examination of the left thigh,
there are findings compatible with extensive left femoropopliteal
deep venous thrombosis.
2. Associated diffuse perivascular and muscular edema, likely
related to the DVT. Rhabdomyolysis could have a similar appearance.
3. No significant osseous findings.

## 2021-06-27 MED ORDER — GADOBUTROL 1 MMOL/ML IV SOLN
9.0000 mL | Freq: Once | INTRAVENOUS | Status: AC | PRN
  Administered 2021-06-27: 9 mL via INTRAVENOUS
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
## 2021-06-28 ENCOUNTER — Other Ambulatory Visit: Payer: Self-pay

## 2021-06-28 DIAGNOSIS — I82412 Acute embolism and thrombosis of left femoral vein: Secondary | ICD-10-CM

## 2021-07-01 ENCOUNTER — Other Ambulatory Visit (HOSPITAL_COMMUNITY): Payer: Self-pay

## 2021-07-04 ENCOUNTER — Other Ambulatory Visit (HOSPITAL_COMMUNITY): Payer: Self-pay

## 2021-07-04 MED ORDER — OXYCODONE-ACETAMINOPHEN 5-325 MG PO TABS
1.0000 | ORAL_TABLET | Freq: Two times a day (BID) | ORAL | 0 refills | Status: DC | PRN
  Filled 2021-07-04: qty 40, 10d supply, fill #0

## 2021-07-10 ENCOUNTER — Other Ambulatory Visit (HOSPITAL_COMMUNITY): Payer: Self-pay

## 2021-07-10 MED ORDER — AMOXICILLIN-POT CLAVULANATE 875-125 MG PO TABS
1.0000 | ORAL_TABLET | Freq: Two times a day (BID) | ORAL | 0 refills | Status: DC
  Filled 2021-07-10: qty 28, 14d supply, fill #0

## 2021-07-11 ENCOUNTER — Other Ambulatory Visit (HOSPITAL_COMMUNITY): Payer: Self-pay

## 2021-07-12 ENCOUNTER — Encounter (HOSPITAL_COMMUNITY): Payer: Self-pay | Admitting: Orthopedic Surgery

## 2021-07-12 ENCOUNTER — Other Ambulatory Visit (HOSPITAL_COMMUNITY): Payer: Self-pay

## 2021-07-12 ENCOUNTER — Other Ambulatory Visit: Payer: Self-pay

## 2021-07-12 MED ORDER — OXYCODONE-ACETAMINOPHEN 5-325 MG PO TABS
1.0000 | ORAL_TABLET | Freq: Two times a day (BID) | ORAL | 0 refills | Status: DC | PRN
  Filled 2021-07-12 (×2): qty 40, 10d supply, fill #0

## 2021-07-12 MED ORDER — ELIQUIS 5 MG PO TABS
5.0000 mg | ORAL_TABLET | Freq: Two times a day (BID) | ORAL | 1 refills | Status: DC
  Filled 2021-07-12: qty 60, 30d supply, fill #0
  Filled 2021-09-26: qty 60, 30d supply, fill #1

## 2021-07-12 NOTE — Progress Notes (Signed)
Called pt for pre-op call, he requested that I call his spouse, Wayne Hunter to do the pre-op call. I spoke with Wayne Hunter and she states Wayne Hunter does not have a cardiac history. Pt does have a DVT in his left leg that occurred after his injury. Pt is on Eliquis. Wayne Hunter said that Dr. Carola Frost told them to continue the Eliquis. I did call Montez Morita, PA and verified that pt was to continue the Eliquis and Mellody Dance said yes.   Pt will need a Covid test day of surgery.
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
## 2021-07-16 ENCOUNTER — Inpatient Hospital Stay (HOSPITAL_COMMUNITY)
Admission: RE | Admit: 2021-07-16 | Discharge: 2021-07-24 | DRG: 574 | Disposition: A | Discharge status: Rehabilitation Facility | Payer: PRIVATE HEALTH INSURANCE | Attending: Plastic Surgery | Admitting: Plastic Surgery

## 2021-07-16 ENCOUNTER — Inpatient Hospital Stay (HOSPITAL_COMMUNITY): Payer: PRIVATE HEALTH INSURANCE | Admitting: Anesthesiology

## 2021-07-16 ENCOUNTER — Other Ambulatory Visit: Payer: Self-pay

## 2021-07-16 ENCOUNTER — Encounter (HOSPITAL_COMMUNITY): Payer: Self-pay | Admitting: Orthopedic Surgery

## 2021-07-16 ENCOUNTER — Encounter (HOSPITAL_COMMUNITY)
Admission: RE | Disposition: A | Discharge status: Rehabilitation Facility | Payer: Self-pay | Source: Home / Self Care | Attending: Plastic Surgery

## 2021-07-16 DIAGNOSIS — S7011XA Contusion of right thigh, initial encounter: Secondary | ICD-10-CM | POA: Diagnosis present

## 2021-07-16 DIAGNOSIS — Z7901 Long term (current) use of anticoagulants: Secondary | ICD-10-CM | POA: Diagnosis not present

## 2021-07-16 DIAGNOSIS — L089 Local infection of the skin and subcutaneous tissue, unspecified: Secondary | ICD-10-CM | POA: Diagnosis present

## 2021-07-16 DIAGNOSIS — R509 Fever, unspecified: Secondary | ICD-10-CM | POA: Diagnosis not present

## 2021-07-16 DIAGNOSIS — D62 Acute posthemorrhagic anemia: Secondary | ICD-10-CM | POA: Diagnosis not present

## 2021-07-16 DIAGNOSIS — S7012XA Contusion of left thigh, initial encounter: Secondary | ICD-10-CM | POA: Diagnosis present

## 2021-07-16 DIAGNOSIS — S7712XA Crushing injury of left thigh, initial encounter: Secondary | ICD-10-CM

## 2021-07-16 DIAGNOSIS — W230XXA Caught, crushed, jammed, or pinched between moving objects, initial encounter: Secondary | ICD-10-CM | POA: Diagnosis present

## 2021-07-16 DIAGNOSIS — S71009S Unspecified open wound, unspecified hip, sequela: Secondary | ICD-10-CM | POA: Diagnosis not present

## 2021-07-16 DIAGNOSIS — Z86718 Personal history of other venous thrombosis and embolism: Secondary | ICD-10-CM | POA: Diagnosis not present

## 2021-07-16 DIAGNOSIS — S71109S Unspecified open wound, unspecified thigh, sequela: Secondary | ICD-10-CM | POA: Diagnosis not present

## 2021-07-16 DIAGNOSIS — Z20822 Contact with and (suspected) exposure to covid-19: Secondary | ICD-10-CM | POA: Diagnosis present

## 2021-07-16 DIAGNOSIS — Z79899 Other long term (current) drug therapy: Secondary | ICD-10-CM

## 2021-07-16 DIAGNOSIS — Y990 Civilian activity done for income or pay: Secondary | ICD-10-CM | POA: Diagnosis not present

## 2021-07-16 DIAGNOSIS — L02416 Cutaneous abscess of left lower limb: Principal | ICD-10-CM | POA: Diagnosis present

## 2021-07-16 DIAGNOSIS — S71101D Unspecified open wound, right thigh, subsequent encounter: Secondary | ICD-10-CM | POA: Diagnosis not present

## 2021-07-16 DIAGNOSIS — S71101A Unspecified open wound, right thigh, initial encounter: Secondary | ICD-10-CM | POA: Diagnosis not present

## 2021-07-16 DIAGNOSIS — I96 Gangrene, not elsewhere classified: Secondary | ICD-10-CM | POA: Diagnosis present

## 2021-07-16 DIAGNOSIS — S7712XD Crushing injury of left thigh, subsequent encounter: Secondary | ICD-10-CM | POA: Diagnosis not present

## 2021-07-16 DIAGNOSIS — R5381 Other malaise: Secondary | ICD-10-CM | POA: Diagnosis not present

## 2021-07-16 DIAGNOSIS — I82402 Acute embolism and thrombosis of unspecified deep veins of left lower extremity: Secondary | ICD-10-CM | POA: Diagnosis not present

## 2021-07-16 DIAGNOSIS — Z91030 Bee allergy status: Secondary | ICD-10-CM

## 2021-07-16 DIAGNOSIS — S71102D Unspecified open wound, left thigh, subsequent encounter: Secondary | ICD-10-CM | POA: Diagnosis not present

## 2021-07-16 DIAGNOSIS — S7711XD Crushing injury of right thigh, subsequent encounter: Secondary | ICD-10-CM | POA: Diagnosis not present

## 2021-07-16 DIAGNOSIS — S71102A Unspecified open wound, left thigh, initial encounter: Secondary | ICD-10-CM | POA: Diagnosis not present

## 2021-07-16 DIAGNOSIS — S71109A Unspecified open wound, unspecified thigh, initial encounter: Secondary | ICD-10-CM | POA: Diagnosis not present

## 2021-07-16 DIAGNOSIS — S7711XA Crushing injury of right thigh, initial encounter: Secondary | ICD-10-CM

## 2021-07-16 HISTORY — DX: Acute embolism and thrombosis of unspecified deep veins of unspecified lower extremity: I82.409

## 2021-07-16 HISTORY — PX: INCISION AND DRAINAGE OF WOUND: SHX1803

## 2021-07-16 LAB — CBC WITH DIFFERENTIAL/PLATELET
Abs Immature Granulocytes: 0.16 10*3/uL — ABNORMAL HIGH (ref 0.00–0.07)
Basophils Absolute: 0 10*3/uL (ref 0.0–0.1)
Basophils Relative: 0 %
Eosinophils Absolute: 0.1 10*3/uL (ref 0.0–0.5)
Eosinophils Relative: 1 %
HCT: 26.2 % — ABNORMAL LOW (ref 39.0–52.0)
Hemoglobin: 7.8 g/dL — ABNORMAL LOW (ref 13.0–17.0)
Immature Granulocytes: 2 %
Lymphocytes Relative: 14 %
Lymphs Abs: 1.3 10*3/uL (ref 0.7–4.0)
MCH: 22.5 pg — ABNORMAL LOW (ref 26.0–34.0)
MCHC: 29.8 g/dL — ABNORMAL LOW (ref 30.0–36.0)
MCV: 75.5 fL — ABNORMAL LOW (ref 80.0–100.0)
Monocytes Absolute: 0.6 10*3/uL (ref 0.1–1.0)
Monocytes Relative: 6 %
Neutro Abs: 6.9 10*3/uL (ref 1.7–7.7)
Neutrophils Relative %: 77 %
Platelets: 587 10*3/uL — ABNORMAL HIGH (ref 150–400)
RBC: 3.47 MIL/uL — ABNORMAL LOW (ref 4.22–5.81)
RDW: 17.8 % — ABNORMAL HIGH (ref 11.5–15.5)
WBC: 9.1 10*3/uL (ref 4.0–10.5)
nRBC: 0 % (ref 0.0–0.2)

## 2021-07-16 LAB — POCT I-STAT, CHEM 8
BUN: 5 mg/dL — ABNORMAL LOW (ref 6–20)
Calcium, Ion: 1.2 mmol/L (ref 1.15–1.40)
Chloride: 100 mmol/L (ref 98–111)
Creatinine, Ser: 0.7 mg/dL (ref 0.61–1.24)
Glucose, Bld: 127 mg/dL — ABNORMAL HIGH (ref 70–99)
HCT: 22 % — ABNORMAL LOW (ref 39.0–52.0)
Hemoglobin: 7.5 g/dL — ABNORMAL LOW (ref 13.0–17.0)
Potassium: 4.2 mmol/L (ref 3.5–5.1)
Sodium: 134 mmol/L — ABNORMAL LOW (ref 135–145)
TCO2: 29 mmol/L (ref 22–32)

## 2021-07-16 LAB — COMPREHENSIVE METABOLIC PANEL
ALT: 32 U/L (ref 0–44)
AST: 22 U/L (ref 15–41)
Albumin: 2.4 g/dL — ABNORMAL LOW (ref 3.5–5.0)
Alkaline Phosphatase: 81 U/L (ref 38–126)
Anion gap: 12 (ref 5–15)
BUN: <5 mg/dL — ABNORMAL LOW (ref 6–20)
CO2: 24 mmol/L (ref 22–32)
Calcium: 9.1 mg/dL (ref 8.9–10.3)
Chloride: 98 mmol/L (ref 98–111)
Creatinine, Ser: 0.66 mg/dL (ref 0.61–1.24)
GFR, Estimated: >60 mL/min (ref >60)
Glucose, Bld: 119 mg/dL — ABNORMAL HIGH (ref 70–99)
Potassium: 3.3 mmol/L — ABNORMAL LOW (ref 3.5–5.1)
Sodium: 134 mmol/L — ABNORMAL LOW (ref 135–145)
Total Bilirubin: 0.8 mg/dL (ref 0.3–1.2)
Total Protein: 7.8 g/dL (ref 6.5–8.1)

## 2021-07-16 LAB — C-REACTIVE PROTEIN: CRP: 11.2 mg/dL — ABNORMAL HIGH (ref <1.0)

## 2021-07-16 LAB — SARS CORONAVIRUS 2 BY RT PCR (HOSPITAL ORDER, PERFORMED IN ~~LOC~~ HOSPITAL LAB): SARS Coronavirus 2: NEGATIVE

## 2021-07-16 LAB — PROTIME-INR
INR: 1.5 — ABNORMAL HIGH (ref 0.8–1.2)
Prothrombin Time: 18.5 seconds — ABNORMAL HIGH (ref 11.4–15.2)

## 2021-07-16 LAB — PREPARE RBC (CROSSMATCH)

## 2021-07-16 LAB — SEDIMENTATION RATE: Sed Rate: 130 mm/hr — ABNORMAL HIGH (ref 0–16)

## 2021-07-16 LAB — HEMOGLOBIN A1C
Hgb A1c MFr Bld: 5.7 % — ABNORMAL HIGH (ref 4.8–5.6)
Mean Plasma Glucose: 116.89 mg/dL

## 2021-07-16 LAB — ABO/RH: ABO/RH(D): O POS

## 2021-07-16 LAB — PREALBUMIN: Prealbumin: 11.4 mg/dL — ABNORMAL LOW (ref 18–38)

## 2021-07-16 SURGERY — IRRIGATION AND DEBRIDEMENT WOUND
Anesthesia: General | Laterality: Bilateral

## 2021-07-16 MED ORDER — METOCLOPRAMIDE HCL 5 MG/ML IJ SOLN: 5.0000 mg | Freq: Three times a day (TID) | INTRAMUSCULAR | Status: DC | PRN

## 2021-07-16 MED ORDER — POVIDONE-IODINE 10 % EX SWAB
2.0000 "application " | Freq: Once | CUTANEOUS | Status: AC
  Administered 2021-07-16: 2 via TOPICAL

## 2021-07-16 MED ORDER — ACETAMINOPHEN 500 MG PO TABS
500.0000 mg | ORAL_TABLET | Freq: Three times a day (TID) | ORAL | Status: AC
  Administered 2021-07-16 – 2021-07-17 (×3): 500 mg via ORAL
  Filled 2021-07-16 (×4): qty 1

## 2021-07-16 MED ORDER — HYDROMORPHONE HCL 1 MG/ML IJ SOLN
INTRAMUSCULAR | Status: DC | PRN
  Administered 2021-07-16: .5 mg via INTRAVENOUS

## 2021-07-16 MED ORDER — HYDROCODONE-ACETAMINOPHEN 5-325 MG PO TABS
1.0000 | ORAL_TABLET | ORAL | Status: DC | PRN
  Administered 2021-07-16 – 2021-07-22 (×5): 2 via ORAL
  Administered 2021-07-23 – 2021-07-24 (×3): 1 via ORAL
  Filled 2021-07-16 (×2): qty 1
  Filled 2021-07-16 (×6): qty 2
  Filled 2021-07-16: qty 1

## 2021-07-16 MED ORDER — DEXMEDETOMIDINE (PRECEDEX) IN NS 20 MCG/5ML (4 MCG/ML) IV SYRINGE
PREFILLED_SYRINGE | INTRAVENOUS | Status: DC | PRN
  Administered 2021-07-16: 8 ug via INTRAVENOUS
  Administered 2021-07-16: 4 ug via INTRAVENOUS
  Administered 2021-07-16: 8 ug via INTRAVENOUS

## 2021-07-16 MED ORDER — ACETAMINOPHEN 160 MG/5ML PO SOLN: 325.0000 mg | ORAL | Status: DC | PRN

## 2021-07-16 MED ORDER — MIDAZOLAM HCL 2 MG/2ML IJ SOLN
INTRAMUSCULAR | Status: DC | PRN
  Administered 2021-07-16: 2 mg via INTRAVENOUS

## 2021-07-16 MED ORDER — SODIUM CHLORIDE 0.9% IV SOLUTION: Freq: Once | INTRAVENOUS | Status: AC

## 2021-07-16 MED ORDER — CHLORHEXIDINE GLUCONATE 0.12 % MT SOLN
OROMUCOSAL | Status: AC
  Administered 2021-07-16: 15 mL
  Filled 2021-07-16: qty 15

## 2021-07-16 MED ORDER — ACETAMINOPHEN 325 MG PO TABS
325.0000 mg | ORAL_TABLET | Freq: Four times a day (QID) | ORAL | Status: DC | PRN
  Administered 2021-07-22 – 2021-07-24 (×2): 650 mg via ORAL
  Filled 2021-07-16 (×2): qty 2

## 2021-07-16 MED ORDER — FENTANYL CITRATE (PF) 100 MCG/2ML IJ SOLN
INTRAMUSCULAR | Status: AC
  Filled 2021-07-16: qty 2

## 2021-07-16 MED ORDER — ASCORBIC ACID 500 MG PO TABS
1000.0000 mg | ORAL_TABLET | Freq: Every day | ORAL | Status: DC
  Administered 2021-07-16 – 2021-07-24 (×7): 1000 mg via ORAL
  Filled 2021-07-16 (×7): qty 2

## 2021-07-16 MED ORDER — CYCLOBENZAPRINE HCL 5 MG PO TABS
5.0000 mg | ORAL_TABLET | Freq: Three times a day (TID) | ORAL | Status: DC | PRN
  Administered 2021-07-17: 10 mg via ORAL
  Administered 2021-07-18: 5 mg via ORAL
  Administered 2021-07-18 – 2021-07-20 (×2): 10 mg via ORAL
  Administered 2021-07-24: 5 mg via ORAL
  Administered 2021-07-24: 10 mg via ORAL
  Filled 2021-07-16 (×2): qty 2
  Filled 2021-07-16: qty 1
  Filled 2021-07-16 (×2): qty 2
  Filled 2021-07-16: qty 1
  Filled 2021-07-16: qty 2

## 2021-07-16 MED ORDER — CEFAZOLIN SODIUM-DEXTROSE 2-4 GM/100ML-% IV SOLN
2.0000 g | Freq: Four times a day (QID) | INTRAVENOUS | Status: AC
  Administered 2021-07-16 – 2021-07-17 (×3): 2 g via INTRAVENOUS
  Filled 2021-07-16 (×4): qty 100

## 2021-07-16 MED ORDER — VANCOMYCIN HCL 1000 MG IV SOLR
INTRAVENOUS | Status: AC
  Filled 2021-07-16: qty 40

## 2021-07-16 MED ORDER — 0.9 % SODIUM CHLORIDE (POUR BTL) OPTIME
TOPICAL | Status: DC | PRN
Start: 2021-07-16 — End: 2021-07-16
  Administered 2021-07-16: 1000 mL

## 2021-07-16 MED ORDER — MIDAZOLAM HCL 2 MG/2ML IJ SOLN
INTRAMUSCULAR | Status: AC
  Filled 2021-07-16: qty 2

## 2021-07-16 MED ORDER — CALCIUM CITRATE 950 (200 CA) MG PO TABS
200.0000 mg | ORAL_TABLET | Freq: Two times a day (BID) | ORAL | Status: DC
  Administered 2021-07-16 – 2021-07-24 (×15): 200 mg via ORAL
  Filled 2021-07-16 (×20): qty 1

## 2021-07-16 MED ORDER — ALBUMIN HUMAN 5 % IV SOLN: INTRAVENOUS | Status: DC | PRN

## 2021-07-16 MED ORDER — HYDROMORPHONE HCL 1 MG/ML IJ SOLN
INTRAMUSCULAR | Status: AC
  Filled 2021-07-16: qty 0.5

## 2021-07-16 MED ORDER — DOCUSATE SODIUM 100 MG PO CAPS
100.0000 mg | ORAL_CAPSULE | Freq: Two times a day (BID) | ORAL | Status: DC
  Administered 2021-07-16 – 2021-07-24 (×15): 100 mg via ORAL
  Filled 2021-07-16 (×16): qty 1

## 2021-07-16 MED ORDER — SODIUM CHLORIDE 0.9 % IR SOLN
Status: DC | PRN
  Administered 2021-07-16 (×2): 3000 mL

## 2021-07-16 MED ORDER — MORPHINE SULFATE (PF) 2 MG/ML IV SOLN
0.5000 mg | INTRAVENOUS | Status: DC | PRN
  Administered 2021-07-16 – 2021-07-24 (×17): 1 mg via INTRAVENOUS
  Filled 2021-07-16 (×17): qty 1

## 2021-07-16 MED ORDER — MEPERIDINE HCL 25 MG/ML IJ SOLN: 6.2500 mg | INTRAMUSCULAR | Status: DC | PRN

## 2021-07-16 MED ORDER — ACETAMINOPHEN 325 MG PO TABS: 325.0000 mg | ORAL_TABLET | ORAL | Status: DC | PRN

## 2021-07-16 MED ORDER — LACTATED RINGERS IV SOLN: INTRAVENOUS | Status: DC

## 2021-07-16 MED ORDER — FENTANYL CITRATE (PF) 100 MCG/2ML IJ SOLN
25.0000 ug | INTRAMUSCULAR | Status: DC | PRN
  Administered 2021-07-16 (×3): 25 ug via INTRAVENOUS

## 2021-07-16 MED ORDER — PROPOFOL 10 MG/ML IV BOLUS
INTRAVENOUS | Status: DC | PRN
  Administered 2021-07-16: 200 mg via INTRAVENOUS

## 2021-07-16 MED ORDER — HYDROCODONE-ACETAMINOPHEN 7.5-325 MG PO TABS
1.0000 | ORAL_TABLET | ORAL | Status: DC | PRN
  Administered 2021-07-17 – 2021-07-21 (×13): 2 via ORAL
  Administered 2021-07-23: 1 via ORAL
  Administered 2021-07-23 – 2021-07-24 (×2): 2 via ORAL
  Filled 2021-07-16 (×12): qty 2
  Filled 2021-07-16: qty 1
  Filled 2021-07-16 (×5): qty 2

## 2021-07-16 MED ORDER — FENTANYL CITRATE (PF) 250 MCG/5ML IJ SOLN
INTRAMUSCULAR | Status: DC | PRN
Start: 2021-07-16 — End: 2021-07-16
  Administered 2021-07-16 (×4): 50 ug via INTRAVENOUS
  Administered 2021-07-16: 100 ug via INTRAVENOUS
  Administered 2021-07-16 (×2): 50 ug via INTRAVENOUS
  Administered 2021-07-16: 100 ug via INTRAVENOUS

## 2021-07-16 MED ORDER — ACETAMINOPHEN 10 MG/ML IV SOLN
INTRAVENOUS | Status: DC | PRN
  Administered 2021-07-16: 1000 mg via INTRAVENOUS

## 2021-07-16 MED ORDER — POTASSIUM CHLORIDE IN NACL 20-0.9 MEQ/L-% IV SOLN
INTRAVENOUS | Status: DC
  Filled 2021-07-16 (×2): qty 1000

## 2021-07-16 MED ORDER — CEFAZOLIN SODIUM-DEXTROSE 2-4 GM/100ML-% IV SOLN
2.0000 g | INTRAVENOUS | Status: AC
  Administered 2021-07-16: 2 g via INTRAVENOUS
  Filled 2021-07-16: qty 100

## 2021-07-16 MED ORDER — FENTANYL CITRATE (PF) 250 MCG/5ML IJ SOLN
INTRAMUSCULAR | Status: AC
  Filled 2021-07-16: qty 5

## 2021-07-16 MED ORDER — DEXAMETHASONE SODIUM PHOSPHATE 10 MG/ML IJ SOLN
INTRAMUSCULAR | Status: AC
  Filled 2021-07-16: qty 1

## 2021-07-16 MED ORDER — APIXABAN 5 MG PO TABS
5.0000 mg | ORAL_TABLET | Freq: Two times a day (BID) | ORAL | Status: DC
  Administered 2021-07-16 – 2021-07-24 (×14): 5 mg via ORAL
  Filled 2021-07-16 (×14): qty 1

## 2021-07-16 MED ORDER — ONDANSETRON HCL 4 MG/2ML IJ SOLN: 4.0000 mg | Freq: Four times a day (QID) | INTRAMUSCULAR | Status: DC | PRN

## 2021-07-16 MED ORDER — OXYCODONE HCL 5 MG PO TABS
ORAL_TABLET | ORAL | Status: AC
  Filled 2021-07-16: qty 1

## 2021-07-16 MED ORDER — DEXAMETHASONE SODIUM PHOSPHATE 10 MG/ML IJ SOLN
INTRAMUSCULAR | Status: DC | PRN
Start: 2021-07-16 — End: 2021-07-16
  Administered 2021-07-16: 5 mg via INTRAVENOUS

## 2021-07-16 MED ORDER — LIDOCAINE 2% (20 MG/ML) 5 ML SYRINGE
INTRAMUSCULAR | Status: DC | PRN
  Administered 2021-07-16: 60 mg via INTRAVENOUS

## 2021-07-16 MED ORDER — ACETAMINOPHEN 10 MG/ML IV SOLN
INTRAVENOUS | Status: AC
  Filled 2021-07-16: qty 100

## 2021-07-16 MED ORDER — ONDANSETRON HCL 4 MG/2ML IJ SOLN
INTRAMUSCULAR | Status: DC | PRN
  Administered 2021-07-16: 4 mg via INTRAVENOUS

## 2021-07-16 MED ORDER — CHLORHEXIDINE GLUCONATE 4 % EX LIQD: 60.0000 mL | Freq: Once | CUTANEOUS | Status: DC

## 2021-07-16 MED ORDER — FERROUS SULFATE 325 (65 FE) MG PO TABS
325.0000 mg | ORAL_TABLET | Freq: Three times a day (TID) | ORAL | Status: DC
  Administered 2021-07-16 – 2021-07-24 (×18): 325 mg via ORAL
  Filled 2021-07-16 (×20): qty 1

## 2021-07-16 MED ORDER — ONDANSETRON HCL 4 MG PO TABS: 4.0000 mg | ORAL_TABLET | Freq: Four times a day (QID) | ORAL | Status: DC | PRN

## 2021-07-16 MED ORDER — METOCLOPRAMIDE HCL 5 MG PO TABS: 5.0000 mg | ORAL_TABLET | Freq: Three times a day (TID) | ORAL | Status: DC | PRN

## 2021-07-16 MED ORDER — OXYCODONE HCL 5 MG PO TABS
5.0000 mg | ORAL_TABLET | Freq: Once | ORAL | Status: AC | PRN
  Administered 2021-07-16: 5 mg via ORAL

## 2021-07-16 MED ORDER — PROPOFOL 10 MG/ML IV BOLUS
INTRAVENOUS | Status: AC
  Filled 2021-07-16: qty 20

## 2021-07-16 MED ORDER — ONDANSETRON HCL 4 MG/2ML IJ SOLN
INTRAMUSCULAR | Status: AC
  Filled 2021-07-16: qty 2

## 2021-07-16 MED ORDER — LIDOCAINE 2% (20 MG/ML) 5 ML SYRINGE
INTRAMUSCULAR | Status: AC
  Filled 2021-07-16: qty 5

## 2021-07-16 MED ORDER — ONDANSETRON HCL 4 MG/2ML IJ SOLN: 4.0000 mg | Freq: Once | INTRAMUSCULAR | Status: DC | PRN

## 2021-07-16 MED ORDER — OXYCODONE HCL 5 MG/5ML PO SOLN: 5.0000 mg | Freq: Once | ORAL | Status: AC | PRN

## 2021-07-16 SURGICAL SUPPLY — 46 items
BAG COUNTER SPONGE SURGICOUNT (BAG) IMPLANT
BAG SPNG CNTER NS LX DISP (BAG)
BNDG COHESIVE 4X5 TAN STRL (GAUZE/BANDAGES/DRESSINGS) ×2 IMPLANT
BNDG GAUZE ELAST 4 BULKY (GAUZE/BANDAGES/DRESSINGS) ×4 IMPLANT
BRUSH SCRUB EZ PLAIN DRY (MISCELLANEOUS) ×4 IMPLANT
CANISTER WOUND CARE 500ML ATS (WOUND CARE) ×1 IMPLANT
COVER SURGICAL LIGHT HANDLE (MISCELLANEOUS) ×4 IMPLANT
DRAPE U-SHAPE 47X51 STRL (DRAPES) ×2 IMPLANT
DRESSING VERAFLO CLEANSE CC (GAUZE/BANDAGES/DRESSINGS) IMPLANT
DRSG ADAPTIC 3X8 NADH LF (GAUZE/BANDAGES/DRESSINGS) ×2 IMPLANT
DRSG VAC ATS LRG SENSATRAC (GAUZE/BANDAGES/DRESSINGS) ×2 IMPLANT
DRSG VERAFLO CLEANSE CC (GAUZE/BANDAGES/DRESSINGS) ×6
ELECT REM PT RETURN 9FT ADLT (ELECTROSURGICAL)
ELECTRODE REM PT RTRN 9FT ADLT (ELECTROSURGICAL) IMPLANT
GAUZE SPONGE 4X4 12PLY STRL (GAUZE/BANDAGES/DRESSINGS) ×2 IMPLANT
GLOVE SRG 8 PF TXTR STRL LF DI (GLOVE) ×1 IMPLANT
GLOVE SURG ENC MOIS LTX SZ8 (GLOVE) ×2 IMPLANT
GLOVE SURG ORTHO LTX SZ7.5 (GLOVE) ×4 IMPLANT
GLOVE SURG UNDER POLY LF SZ7.5 (GLOVE) ×2 IMPLANT
GLOVE SURG UNDER POLY LF SZ8 (GLOVE) ×2
GLOVE SURG UNDER POLY LF SZ9 (GLOVE) ×2 IMPLANT
GOWN STRL REUS W/ TWL LRG LVL3 (GOWN DISPOSABLE) ×2 IMPLANT
GOWN STRL REUS W/ TWL XL LVL3 (GOWN DISPOSABLE) ×1 IMPLANT
GOWN STRL REUS W/TWL LRG LVL3 (GOWN DISPOSABLE) ×4
GOWN STRL REUS W/TWL XL LVL3 (GOWN DISPOSABLE) ×2
HANDPIECE INTERPULSE COAX TIP (DISPOSABLE)
KIT BASIN OR (CUSTOM PROCEDURE TRAY) ×2 IMPLANT
KIT TURNOVER KIT B (KITS) ×2 IMPLANT
MANIFOLD NEPTUNE II (INSTRUMENTS) ×2 IMPLANT
NS IRRIG 1000ML POUR BTL (IV SOLUTION) ×2 IMPLANT
PACK ORTHO EXTREMITY (CUSTOM PROCEDURE TRAY) ×2 IMPLANT
PAD ARMBOARD 7.5X6 YLW CONV (MISCELLANEOUS) ×4 IMPLANT
PADDING CAST COTTON 6X4 STRL (CAST SUPPLIES) ×2 IMPLANT
SET CYSTO W/LG BORE CLAMP LF (SET/KITS/TRAYS/PACK) ×1 IMPLANT
SET HNDPC FAN SPRY TIP SCT (DISPOSABLE) IMPLANT
SOL PREP POV-IOD 4OZ 10% (MISCELLANEOUS) ×2 IMPLANT
SOL PREP PROV IODINE SCRUB 4OZ (MISCELLANEOUS) ×2 IMPLANT
SPONGE T-LAP 18X18 ~~LOC~~+RFID (SPONGE) ×2 IMPLANT
STOCKINETTE IMPERVIOUS 9X36 MD (GAUZE/BANDAGES/DRESSINGS) IMPLANT
SUT PDS AB 2-0 CT1 27 (SUTURE) IMPLANT
TOWEL GREEN STERILE (TOWEL DISPOSABLE) ×4 IMPLANT
TOWEL GREEN STERILE FF (TOWEL DISPOSABLE) ×2 IMPLANT
TUBE CONNECTING 12X1/4 (SUCTIONS) ×2 IMPLANT
UNDERPAD 30X36 HEAVY ABSORB (UNDERPADS AND DIAPERS) ×2 IMPLANT
WATER STERILE IRR 1000ML POUR (IV SOLUTION) ×2 IMPLANT
YANKAUER SUCT BULB TIP NO VENT (SUCTIONS) ×2 IMPLANT

## 2021-07-16 NOTE — Anesthesia Procedure Notes (Signed)
Procedure Name: LMA Insertion Date/Time: 07/16/2021 8:29 AM Performed by: Zollie Beckers, CRNA Pre-anesthesia Checklist: Patient identified, Emergency Drugs available, Suction available and Patient being monitored Patient Re-evaluated:Patient Re-evaluated prior to induction Oxygen Delivery Method: Circle System Utilized Preoxygenation: Pre-oxygenation with 100% oxygen Induction Type: IV induction Ventilation: Mask ventilation without difficulty LMA: LMA inserted LMA Size: 4.0 Number of attempts: 1 Placement Confirmation: positive ETCO2 Tube secured with: Tape Dental Injury: Teeth and Oropharynx as per pre-operative assessment

## 2021-07-16 NOTE — Anesthesia Postprocedure Evaluation (Signed)
Anesthesia Post Note  Patient: Wayne Hunter  Procedure(s) Performed: IRRIGATION AND DEBRIDEMENT WOUND THIGHS (Bilateral)     Patient location during evaluation: PACU Anesthesia Type: General Level of consciousness: awake and alert Pain management: pain level controlled Vital Signs Assessment: post-procedure vital signs reviewed and stable Respiratory status: spontaneous breathing, nonlabored ventilation, respiratory function stable and patient connected to nasal cannula oxygen Cardiovascular status: blood pressure returned to baseline and stable Postop Assessment: no apparent nausea or vomiting Anesthetic complications: no   No notable events documented.  Last Vitals:  Vitals:   07/16/21 0549 07/16/21 1030  BP: 133/66 123/83  Pulse: (!) 110 (!) 116  Resp: 20 17  Temp: 36.9 C 36.7 C  SpO2: 93% 96%    Last Pain:  Vitals:   07/16/21 1030  TempSrc:   PainSc: 0-No pain                 Elwood Bazinet

## 2021-07-16 NOTE — Brief Op Note (Signed)
07/16/2021  9:45 AM  PATIENT:  Wayne Hunter  26 y.o. male  PRE-OPERATIVE DIAGNOSIS:   SOFT TISSUE INFECTION LEFT THIGH SOFT TISSUE INFECTION RIGHT THIGH  [phone]
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
## 2021-07-16 NOTE — Transfer of Care (Signed)
Immediate Anesthesia Transfer of Care Note  Patient: Wayne Hunter  Procedure(s) Performed: IRRIGATION AND DEBRIDEMENT WOUND THIGHS (Bilateral)  Patient Location: PACU  Anesthesia Type:General  Level of Consciousness: drowsy  Airway & Oxygen Therapy: Patient Spontanous Breathing and Patient connected to face mask oxygen  Post-op Assessment: Report given to RN and Post -op Vital signs reviewed and stable  Post vital signs: Reviewed and stable  Last Vitals:  Vitals Value Taken Time  BP 123/83 07/16/21 1031  Temp    Pulse 116 07/16/21 1032  Resp 27 07/16/21 1032  SpO2 99 % 07/16/21 1032  Vitals shown include unvalidated device data.  Last Pain:  Vitals:   07/16/21 0650  TempSrc:   PainSc: 7       Patients Stated Pain Goal: 2 ([id number])  Complications: No notable events documented.

## 2021-07-17 ENCOUNTER — Encounter (HOSPITAL_COMMUNITY): Payer: Self-pay | Admitting: Orthopedic Surgery

## 2021-07-17 LAB — BASIC METABOLIC PANEL
Anion gap: 8 (ref 5–15)
BUN: 5 mg/dL — ABNORMAL LOW (ref 6–20)
CO2: 27 mmol/L (ref 22–32)
Calcium: 8.6 mg/dL — ABNORMAL LOW (ref 8.9–10.3)
Chloride: 101 mmol/L (ref 98–111)
Creatinine, Ser: 0.59 mg/dL — ABNORMAL LOW (ref 0.61–1.24)
GFR, Estimated: >60 mL/min (ref >60)
Glucose, Bld: 111 mg/dL — ABNORMAL HIGH (ref 70–99)
Potassium: 4.1 mmol/L (ref 3.5–5.1)
Sodium: 136 mmol/L (ref 135–145)

## 2021-07-17 LAB — CBC
HCT: 21.4 % — ABNORMAL LOW (ref 39.0–52.0)
Hemoglobin: 6.5 g/dL — CL (ref 13.0–17.0)
MCH: 23.1 pg — ABNORMAL LOW (ref 26.0–34.0)
MCHC: 30.4 g/dL (ref 30.0–36.0)
MCV: 76.2 fL — ABNORMAL LOW (ref 80.0–100.0)
Platelets: 510 10*3/uL — ABNORMAL HIGH (ref 150–400)
RBC: 2.81 MIL/uL — ABNORMAL LOW (ref 4.22–5.81)
RDW: 17.6 % — ABNORMAL HIGH (ref 11.5–15.5)
WBC: 9.5 10*3/uL (ref 4.0–10.5)
nRBC: 0.2 % (ref 0.0–0.2)

## 2021-07-17 LAB — PREPARE RBC (CROSSMATCH)

## 2021-07-17 LAB — SURGICAL PCR SCREEN
MRSA, PCR: NEGATIVE
Staphylococcus aureus: NEGATIVE

## 2021-07-17 LAB — HEMOGLOBIN AND HEMATOCRIT, BLOOD
HCT: 27.2 % — ABNORMAL LOW (ref 39.0–52.0)
Hemoglobin: 8.7 g/dL — ABNORMAL LOW (ref 13.0–17.0)

## 2021-07-17 MED ORDER — SODIUM CHLORIDE 0.9% IV SOLUTION: Freq: Once | INTRAVENOUS | Status: DC

## 2021-07-17 MED ORDER — ZINC SULFATE 220 (50 ZN) MG PO CAPS
220.0000 mg | ORAL_CAPSULE | Freq: Every day | ORAL | Status: DC
  Administered 2021-07-17 – 2021-07-24 (×6): 220 mg via ORAL
  Filled 2021-07-17 (×6): qty 1

## 2021-07-17 MED ORDER — CEFAZOLIN SODIUM-DEXTROSE 2-4 GM/100ML-% IV SOLN
2.0000 g | INTRAVENOUS | Status: AC
  Administered 2021-07-18: 2 g via INTRAVENOUS
  Filled 2021-07-17 (×2): qty 100

## 2021-07-17 MED ORDER — PROSOURCE PLUS PO LIQD: 30.0000 mL | Freq: Two times a day (BID) | ORAL | Status: DC

## 2021-07-17 MED ORDER — FUROSEMIDE 10 MG/ML IJ SOLN
20.0000 mg | Freq: Once | INTRAMUSCULAR | Status: AC
  Administered 2021-07-17: 20 mg via INTRAVENOUS
  Filled 2021-07-17: qty 2

## 2021-07-17 MED ORDER — FUROSEMIDE 10 MG/ML IJ SOLN
20.0000 mg | Freq: Once | INTRAMUSCULAR | Status: DC
  Administered 2021-07-17: 20 mg via INTRAVENOUS

## 2021-07-17 MED ORDER — JUVEN PO PACK
1.0000 | PACK | Freq: Two times a day (BID) | ORAL | Status: DC
  Administered 2021-07-17 – 2021-07-24 (×10): 1 via ORAL
  Filled 2021-07-17 (×11): qty 1

## 2021-07-17 MED ORDER — KETOROLAC TROMETHAMINE 15 MG/ML IJ SOLN: 15.0000 mg | Freq: Three times a day (TID) | INTRAMUSCULAR | Status: DC

## 2021-07-17 MED ORDER — ADULT MULTIVITAMIN W/MINERALS CH
1.0000 | ORAL_TABLET | Freq: Every day | ORAL | Status: DC
  Administered 2021-07-17 – 2021-07-24 (×6): 1 via ORAL
  Filled 2021-07-17 (×6): qty 1

## 2021-07-17 MED ORDER — CEFAZOLIN SODIUM-DEXTROSE 2-4 GM/100ML-% IV SOLN
2.0000 g | Freq: Four times a day (QID) | INTRAVENOUS | Status: AC
  Administered 2021-07-17 (×3): 2 g via INTRAVENOUS
  Filled 2021-07-17 (×4): qty 100

## 2021-07-17 MED ORDER — DIPHENHYDRAMINE HCL 25 MG PO CAPS
25.0000 mg | ORAL_CAPSULE | Freq: Once | ORAL | Status: AC
  Administered 2021-07-17: 25 mg via ORAL
  Filled 2021-07-17: qty 1

## 2021-07-17 MED ORDER — PROSOURCE PLUS PO LIQD
30.0000 mL | Freq: Three times a day (TID) | ORAL | Status: DC
  Administered 2021-07-17 – 2021-07-24 (×16): 30 mL via ORAL
  Filled 2021-07-17 (×18): qty 30

## 2021-07-17 MED ORDER — SODIUM CHLORIDE 0.9% IV SOLUTION: Freq: Once | INTRAVENOUS | Status: AC

## 2021-07-17 MED ORDER — KETOROLAC TROMETHAMINE 15 MG/ML IJ SOLN
15.0000 mg | Freq: Three times a day (TID) | INTRAMUSCULAR | Status: AC
  Administered 2021-07-17 – 2021-07-22 (×14): 15 mg via INTRAVENOUS
  Filled 2021-07-17 (×14): qty 1

## 2021-07-17 MED ORDER — HYDROMORPHONE HCL 1 MG/ML IJ SOLN
1.0000 mg | Freq: Once | INTRAMUSCULAR | Status: AC
  Administered 2021-07-17: 1 mg via INTRAVENOUS
  Filled 2021-07-17: qty 1

## 2021-07-17 MED ORDER — ACETAMINOPHEN 325 MG PO TABS
650.0000 mg | ORAL_TABLET | Freq: Once | ORAL | Status: AC
  Administered 2021-07-17: 650 mg via ORAL
  Filled 2021-07-17: qty 2
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
## 2021-07-17 NOTE — Progress Notes (Signed)
Orthopaedic Trauma Service Progress Note  Patient ID: [name]: [id number] DOB/AGE: 1996/04/16 25 y.o.  Subjective:  Doing fair Tired  Pain tolerable   No CP or SOB   ROS As above  Objective:   VITALS:   Vitals:   07/17/21 0019 07/17/21 0451 07/17/21 0512 07/17/21 0735  BP: (!) 116/59 119/65 117/68 118/69  Pulse: 100 94 96 95  Resp: 18 18 18 16   Temp: 98.1 F (36.7 C) 98.4 F (36.9 C) 97.9 F (36.6 C) 97.9 F (36.6 C)  TempSrc: Oral Oral Oral Oral  SpO2: 98% 97% 98% 97%  Weight:      Height:        Estimated body mass index is 32.28 kg/m as calculated from the following:   Height as of this encounter: 5\' 6"  (1.676 m).   Weight as of this encounter: 90.7 kg.   Intake/Output      01/03 0701 01/04 0700 01/04 0701 01/05 0700   I.V. (mL/kg) 894.5 (9.9)    Blood  315   IV Piggyback 350    Total Intake(mL/kg) 1244.5 (13.7) 315 (3.5)   Urine (mL/kg/hr) 850 (0.4)    Drains 1100    Blood 300    Total Output 2250    Net -1005.5 +315          LABS  Results for orders placed or performed during the hospital encounter of 07/16/21 (from the past 24 hour(s))  Prepare RBC (crossmatch)     Status: None   Collection Time: 07/16/21  9:06 AM  Result Value Ref Range   Order Confirmation      ORDER PROCESSED BY BLOOD BANK Performed at [facility], [address], [place], [address] [place]   I-STAT, [place] [address]     Status: Abnormal   Collection Time: 07/16/21  9:47 AM  Result Value Ref Range   Sodium 134 (L) 135 - 145 mmol/L   Potassium 4.2 3.5 - 5.1 mmol/L   Chloride 100 98 - 111 mmol/L   BUN 5 (L) 6 - 20 mg/dL   Creatinine, Ser [place] 0.61 - 1.24 mg/dL   Glucose, Bld 09/13/21 (H) 70 - 99 mg/dL   Calcium, Ion 1.63 1.15 - 1.40 mmol/L   TCO2 29 22 - 32 mmol/L   Hemoglobin 7.5 (L) 13.0 - 17.0 g/dL   HCT 846 (L) 6.59 - 93.5 %  Basic metabolic panel     Status: Abnormal    Collection Time: 07/17/21  1:58 AM  Result Value Ref Range   Sodium 136 135 - 145 mmol/L   Potassium 4.1 3.5 - 5.1 mmol/L   Chloride 101 98 - 111 mmol/L   CO2 27 22 - 32 mmol/L   Glucose, Bld 111 (H) 70 - 99 mg/dL   BUN 5 (L) 6 - 20 mg/dL   Creatinine, Ser 77.9 (L) 0.61 - 1.24 mg/dL   Calcium 8.6 (L) 8.9 - 10.3 mg/dL   GFR, Estimated 09/14/21 3.90 mL/min   Anion gap 8 5 - 15  CBC     Status: Abnormal   Collection Time: 07/17/21  1:58 AM  Result Value Ref Range   WBC 9.5 4.0 - 10.5 K/uL   RBC 2.81 (L) 4.22 - 5.81 MIL/uL   Hemoglobin 6.5 (LL) 13.0 - 17.0 g/dL  HCT 21.4 (L) 39.0 - 52.0 %   MCV 76.2 (L) 80.0 - 100.0 fL   MCH 23.1 (L) 26.0 - 34.0 pg   MCHC 30.4 30.0 - 36.0 g/dL   RDW 44.9 (H) 67.5 - 91.6 %   Platelets 510 (H) 150 - 400 K/uL   nRBC 0.2 0.0 - 0.2 %  Prepare RBC (crossmatch)     Status: None   Collection Time: 07/17/21  3:35 AM  Result Value Ref Range   Order Confirmation      ORDER PROCESSED BY BLOOD BANK Performed at [facility], [address], [place], [place] [address]      PHYSICAL EXAM:   Gen: in bed, NAD, looks tired  Lungs: clear, unlabored  Cardiac: RRR Ext:       Right Lower extremity   Vac functioning well  Serosanguinous drainage in canister   Ext warm   Motor and sensory functions intact        Left Lower Extremity   Veraflo functioning well   Serosanguinous drainage   Ext warm    Motor and sensory functions intact  Swelling stable   Assessment/Plan: 1 Day Post-Op    Anti-infectives (From admission, onward)    Start     Dose/Rate Route Frequency Ordered Stop   07/16/21 1530  ceFAZolin (ANCEF) IVPB 2g/100 mL premix        2 g 200 mL/hr over 30 Minutes Intravenous Every 6 hours 07/16/21 1442 07/17/21 0438   07/16/21 0600  ceFAZolin (ANCEF) IVPB 2g/100 mL premix        2 g 200 mL/hr over 30 Minutes Intravenous On call to O.R. 07/16/21 5993 07/16/21 0830     .  POD/HD#: 30  26 y/o male with full thickness necrosis B thighs  due to crush injury/morel-lavallee lesion   -full thickness necrosis B thighs due to crush injury/morel-lavallee lesion s/p I&D  Return to OR tomorrow for repeat I&D and vac changes  May proceed with grafting of R thigh   [name]    Ok to get out of bed   - Pain management:  Multimodal   - ABL anemia/Hemodynamics  Transfuse 2 units PRBCs today   Iron supplementation   - Medical issues   L leg dvt    Continue eliquis   - DVT/PE prophylaxis:  As above  - ID:   Perip abx   - Activity:  OOB as tolerated  - FEN/GI prophylaxis/Foley/Lines:  RD consult for recs to optimize soft tissue healing  Low prealbumin    NPO after MN  - Dispo:  OR tomorrow    Mearl Latin, PA-C [phone] (C) 07/17/2021, 8:31 AM  [facility] [address] [place] [address] [phone] [phone] (F)    After 5pm and on the weekends please log on to Amion, go to orthopaedics and the look under the [facility] Call for the provider(s) on call. You can also call our office at [phone] and then follow the prompts to be connected to the call team.   Patient ID: Wayne Hunter, male   DOB: [birth year], 26 y.o.   MRN: [id number]

## 2021-07-17 NOTE — Progress Notes (Signed)
   Inpatient Rehab Admissions Coordinator :  Per therapy recommendations patient was screened for CIR candidacy by Sharifa Bucholz RN MSN. Patient is not yet at a level to tolerate the intensity required to pursue a CIR admit . Patient may have the potential to progress to become a candidate. The CIR admissions team will follow and monitor for progress and place a Rehab Consult order if felt to be appropriate. Please contact me with any questions.  Kennis Buell RN MSN Admissions Coordinator [phone]  

## 2021-07-17 NOTE — TOC Initial Note (Addendum)
Transition of Care [facility]) - Initial/Assessment Note    Patient Details  Name: Wayne Hunter MRN: [id number] Date of Birth: 1995-12-17  [facility] CM/SW Contact:    Kingsley Plan, RN Phone Number: 07/17/2021, 10:42 AM  Clinical Narrative:                 Spoke to patient and wife at bedside. Confirmed face sheet information.  Patient's Workers Comp Sports coach is Karna Christmas with [employer] phone [phone] fax [phone]   [employer] is Nurse, adult [place] [phone].  On patient's prior discharge from hospital he received walker , and wheelchair  . Patient doe s NOT have 3 in 1   NCM explained home VAC system. PA signed application for home [facility] and NCM faxed to [facility] with [employer]. Once Workers comp approves home machine and supplies will be delivered to patient's hospital room prior to discharge . Patient will take home dressing supplies home with him for [facility]. Workers Comp will arrange [employer].   NCM spoke to [facility] and faxed requested clinicals.   Plan for patient to return to surgery tomorrow. NCM will continue to follow.   Expected Discharge Plan: Home w Home Health Services Barriers to Discharge: Continued Medical Work up   Patient Goals and CMS Choice Patient states their goals for this hospitalization and ongoing recovery are:: to return to home CMS Medicare.gov Compare Post Acute Care list provided to:: Patient    Expected Discharge Plan and Services Expected Discharge Plan: Home w Home Health Services                         DME Arranged: Vac DME Agency: [employer] Date DME Agency Contacted: 07/17/21 Time DME Agency Contacted: 0800 Representative spoke with at DME Agency: French Ana            Prior Living Arrangements/Services   Lives with:: Spouse Patient language and need for interpreter reviewed:: Yes Do you feel safe going back to the place where you live?: Yes      Need for  Family Participation in Patient Care: Yes (Comment) Care giver support system in place?: Yes (comment) Current home services: DME Criminal Activity/Legal Involvement Pertinent to Current Situation/Hospitalization: No - Comment as needed  Activities of Daily Living   ADL Screening (condition at time of admission) Patient's cognitive ability adequate to safely complete daily activities?: Yes Is the patient deaf or have difficulty hearing?: No Does the patient have difficulty seeing, even when wearing glasses/contacts?: No Does the patient have difficulty concentrating, remembering, or making decisions?: No Patient able to express need for assistance with ADLs?: Yes Does the patient have difficulty dressing or bathing?: Yes Independently performs ADLs?: No Communication: Independent Dressing (OT): Needs assistance Grooming: Independent Feeding: Independent Bathing: Needs assistance Is this a change from baseline?: Pre-admission baseline Toileting: Independent In/Out Bed: Independent Walks in Home: Independent Does the patient have difficulty walking or climbing stairs?: Yes Weakness of Legs: Both Weakness of Arms/Hands: None  Permission Sought/Granted   Permission granted to share information with : Yes, Verbal Permission Granted  Share Information with NAMEKarna Christmas [phone] [employer] fax [phone] and wife           Emotional Assessment Appearance:: Appears stated age Attitude/Demeanor/Rapport: Engaged Affect (typically observed): Accepting Orientation: : Oriented to Self, Oriented to Place, Oriented to  Time, Oriented to  Situation Alcohol / Substance Use: Not Applicable Psych Involvement: No (comment)  Admission diagnosis:  Soft tissue infection [L08.9] Patient Active Problem List   Diagnosis Date Noted   Soft tissue infection 07/16/2021   Acute blood loss anemia 06/07/2021   Crush injury, thigh, right, initial encounter 06/04/2021    Rhabdomyolysis 06/04/2021   Crush injury, thigh, left, initial encounter 05/31/2021   PCP:  Pcp, No Pharmacy:   [facility] - [place], [place] - [address] [place] [address] Phone: [phone] Fax: [phone]  [facility] [address] [place] [address] Phone: [phone] Fax: [phone]     Social Determinants of Health (SDOH) Interventions    Readmission Risk Interventions No flowsheet data found.
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
## 2021-07-18 ENCOUNTER — Inpatient Hospital Stay (HOSPITAL_COMMUNITY): Payer: PRIVATE HEALTH INSURANCE | Admitting: Anesthesiology

## 2021-07-18 ENCOUNTER — Encounter (HOSPITAL_COMMUNITY)
Admission: RE | Disposition: A | Discharge status: Rehabilitation Facility | Payer: Self-pay | Source: Home / Self Care | Attending: Plastic Surgery

## 2021-07-18 ENCOUNTER — Encounter (HOSPITAL_COMMUNITY): Payer: Self-pay | Admitting: Orthopedic Surgery

## 2021-07-18 DIAGNOSIS — S71102A Unspecified open wound, left thigh, initial encounter: Secondary | ICD-10-CM

## 2021-07-18 DIAGNOSIS — L089 Local infection of the skin and subcutaneous tissue, unspecified: Secondary | ICD-10-CM

## 2021-07-18 DIAGNOSIS — S71101A Unspecified open wound, right thigh, initial encounter: Secondary | ICD-10-CM

## 2021-07-18 HISTORY — PX: INCISION AND DRAINAGE OF WOUND: SHX1803

## 2021-07-18 HISTORY — PX: APPLICATION OF WOUND VAC: SHX5189

## 2021-07-18 LAB — CBC
HCT: 27.8 % — ABNORMAL LOW (ref 39.0–52.0)
Hemoglobin: 8.6 g/dL — ABNORMAL LOW (ref 13.0–17.0)
MCH: 24.4 pg — ABNORMAL LOW (ref 26.0–34.0)
MCHC: 30.9 g/dL (ref 30.0–36.0)
MCV: 78.8 fL — ABNORMAL LOW (ref 80.0–100.0)
Platelets: 560 10*3/uL — ABNORMAL HIGH (ref 150–400)
RBC: 3.53 MIL/uL — ABNORMAL LOW (ref 4.22–5.81)
RDW: 17.8 % — ABNORMAL HIGH (ref 11.5–15.5)
WBC: 9 10*3/uL (ref 4.0–10.5)
nRBC: 0.6 % — ABNORMAL HIGH (ref 0.0–0.2)

## 2021-07-18 LAB — COMPREHENSIVE METABOLIC PANEL
ALT: 23 U/L (ref 0–44)
AST: 17 U/L (ref 15–41)
Albumin: 2.3 g/dL — ABNORMAL LOW (ref 3.5–5.0)
Alkaline Phosphatase: 66 U/L (ref 38–126)
Anion gap: 7 (ref 5–15)
BUN: 11 mg/dL (ref 6–20)
CO2: 26 mmol/L (ref 22–32)
Calcium: 9.2 mg/dL (ref 8.9–10.3)
Chloride: 102 mmol/L (ref 98–111)
Creatinine, Ser: 0.55 mg/dL — ABNORMAL LOW (ref 0.61–1.24)
GFR, Estimated: >60 mL/min (ref >60)
Glucose, Bld: 102 mg/dL — ABNORMAL HIGH (ref 70–99)
Potassium: 3.8 mmol/L (ref 3.5–5.1)
Sodium: 135 mmol/L (ref 135–145)
Total Bilirubin: 0.6 mg/dL (ref 0.3–1.2)
Total Protein: 6.8 g/dL (ref 6.5–8.1)

## 2021-07-18 SURGERY — IRRIGATION AND DEBRIDEMENT WOUND
Anesthesia: General | Site: Thigh | Laterality: Bilateral

## 2021-07-18 SURGERY — IRRIGATION AND DEBRIDEMENT EXTREMITY
Anesthesia: Choice | Laterality: Bilateral

## 2021-07-18 MED ORDER — PHENYLEPHRINE 40 MCG/ML (10ML) SYRINGE FOR IV PUSH (FOR BLOOD PRESSURE SUPPORT)
PREFILLED_SYRINGE | INTRAVENOUS | Status: DC | PRN
Start: 2021-07-18 — End: 2021-07-18
  Administered 2021-07-18: 120 ug via INTRAVENOUS

## 2021-07-18 MED ORDER — HYDROMORPHONE HCL 1 MG/ML IJ SOLN
INTRAMUSCULAR | Status: AC
  Filled 2021-07-18: qty 0.5

## 2021-07-18 MED ORDER — DEXMEDETOMIDINE (PRECEDEX) IN NS 20 MCG/5ML (4 MCG/ML) IV SYRINGE
PREFILLED_SYRINGE | INTRAVENOUS | Status: AC
  Filled 2021-07-18: qty 10

## 2021-07-18 MED ORDER — FENTANYL CITRATE (PF) 100 MCG/2ML IJ SOLN
INTRAMUSCULAR | Status: AC
  Filled 2021-07-18: qty 2

## 2021-07-18 MED ORDER — LIDOCAINE 2% (20 MG/ML) 5 ML SYRINGE
INTRAMUSCULAR | Status: DC | PRN
  Administered 2021-07-18: 60 mg via INTRAVENOUS

## 2021-07-18 MED ORDER — ONDANSETRON HCL 4 MG/2ML IJ SOLN
INTRAMUSCULAR | Status: DC | PRN
  Administered 2021-07-18: 4 mg via INTRAVENOUS

## 2021-07-18 MED ORDER — FENTANYL CITRATE (PF) 250 MCG/5ML IJ SOLN
INTRAMUSCULAR | Status: AC
  Filled 2021-07-18: qty 5

## 2021-07-18 MED ORDER — MIDAZOLAM HCL 2 MG/2ML IJ SOLN
INTRAMUSCULAR | Status: DC | PRN
Start: 2021-07-18 — End: 2021-07-18
  Administered 2021-07-18: 2 mg via INTRAVENOUS

## 2021-07-18 MED ORDER — DEXAMETHASONE SODIUM PHOSPHATE 10 MG/ML IJ SOLN
INTRAMUSCULAR | Status: DC | PRN
  Administered 2021-07-18: 10 mg via INTRAVENOUS

## 2021-07-18 MED ORDER — 0.9 % SODIUM CHLORIDE (POUR BTL) OPTIME
TOPICAL | Status: DC | PRN
  Administered 2021-07-18: 1000 mL

## 2021-07-18 MED ORDER — PHENYLEPHRINE HCL-NACL 20-0.9 MG/250ML-% IV SOLN
INTRAVENOUS | Status: DC | PRN
  Administered 2021-07-18: 50 ug/min via INTRAVENOUS

## 2021-07-18 MED ORDER — OXYCODONE HCL 5 MG PO TABS
ORAL_TABLET | ORAL | Status: AC
  Filled 2021-07-18: qty 1

## 2021-07-18 MED ORDER — OXYCODONE HCL 5 MG PO TABS
5.0000 mg | ORAL_TABLET | Freq: Once | ORAL | Status: AC | PRN
  Administered 2021-07-18: 5 mg via ORAL

## 2021-07-18 MED ORDER — ACETAMINOPHEN 325 MG PO TABS: 325.0000 mg | ORAL_TABLET | ORAL | Status: DC | PRN

## 2021-07-18 MED ORDER — LACTATED RINGERS IV SOLN: INTRAVENOUS | Status: DC

## 2021-07-18 MED ORDER — FENTANYL CITRATE (PF) 250 MCG/5ML IJ SOLN
INTRAMUSCULAR | Status: DC | PRN
  Administered 2021-07-18 (×5): 50 ug via INTRAVENOUS

## 2021-07-18 MED ORDER — HYDROMORPHONE HCL 1 MG/ML IJ SOLN
INTRAMUSCULAR | Status: DC | PRN
  Administered 2021-07-18 (×2): .25 mg via INTRAVENOUS
  Administered 2021-07-18: .5 mg via INTRAVENOUS
  Administered 2021-07-18 (×2): .25 mg via INTRAVENOUS

## 2021-07-18 MED ORDER — MIDAZOLAM HCL 2 MG/2ML IJ SOLN
INTRAMUSCULAR | Status: AC
  Filled 2021-07-18: qty 2

## 2021-07-18 MED ORDER — MEPERIDINE HCL 25 MG/ML IJ SOLN: 6.2500 mg | INTRAMUSCULAR | Status: DC | PRN

## 2021-07-18 MED ORDER — ACETAMINOPHEN 160 MG/5ML PO SOLN: 325.0000 mg | ORAL | Status: DC | PRN

## 2021-07-18 MED ORDER — CHLORHEXIDINE GLUCONATE CLOTH 2 % EX PADS: 6.0000 | MEDICATED_PAD | Freq: Once | CUTANEOUS | Status: DC

## 2021-07-18 MED ORDER — SODIUM CHLORIDE 0.9 % IV SOLN
INTRAVENOUS | Status: DC | PRN
  Administered 2021-07-18 (×2): 500 mL

## 2021-07-18 MED ORDER — FENTANYL CITRATE (PF) 100 MCG/2ML IJ SOLN
25.0000 ug | INTRAMUSCULAR | Status: DC | PRN
  Administered 2021-07-18 (×2): 50 ug via INTRAVENOUS

## 2021-07-18 MED ORDER — PROPOFOL 10 MG/ML IV BOLUS
INTRAVENOUS | Status: DC | PRN
Start: 2021-07-18 — End: 2021-07-18
  Administered 2021-07-18: 200 mg via INTRAVENOUS

## 2021-07-18 MED ORDER — DEXMEDETOMIDINE (PRECEDEX) IN NS 20 MCG/5ML (4 MCG/ML) IV SYRINGE
PREFILLED_SYRINGE | INTRAVENOUS | Status: DC | PRN
  Administered 2021-07-18 (×2): 8 ug via INTRAVENOUS

## 2021-07-18 MED ORDER — CHLORHEXIDINE GLUCONATE 0.12 % MT SOLN
OROMUCOSAL | Status: AC
  Administered 2021-07-18: 15 mL via OROMUCOSAL
  Filled 2021-07-18: qty 15

## 2021-07-18 MED ORDER — PROPOFOL 10 MG/ML IV BOLUS
INTRAVENOUS | Status: AC
  Filled 2021-07-18: qty 20

## 2021-07-18 MED ORDER — CHLORHEXIDINE GLUCONATE 0.12 % MT SOLN: 15.0000 mL | Freq: Once | OROMUCOSAL | Status: AC

## 2021-07-18 MED ORDER — ONDANSETRON HCL 4 MG/2ML IJ SOLN: 4.0000 mg | Freq: Once | INTRAMUSCULAR | Status: DC | PRN

## 2021-07-18 MED ORDER — OXYCODONE HCL 5 MG/5ML PO SOLN: 5.0000 mg | Freq: Once | ORAL | Status: AC | PRN

## 2021-07-18 MED ORDER — ORAL CARE MOUTH RINSE: 15.0000 mL | Freq: Once | OROMUCOSAL | Status: AC

## 2021-07-18 SURGICAL SUPPLY — 71 items
APL SKNCLS STERI-STRIP NONHPOA (GAUZE/BANDAGES/DRESSINGS)
APL SWBSTK 6 STRL LF DISP (MISCELLANEOUS)
APPLICATOR COTTON TIP 6 STRL (MISCELLANEOUS) IMPLANT
APPLICATOR COTTON TIP 6IN STRL (MISCELLANEOUS) IMPLANT
BAG COUNTER SPONGE SURGICOUNT (BAG) ×2 IMPLANT
BAG DECANTER FOR FLEXI CONT (MISCELLANEOUS) ×1 IMPLANT
BAG SPNG CNTER NS LX DISP (BAG) ×1
BENZOIN TINCTURE PRP APPL 2/3 (GAUZE/BANDAGES/DRESSINGS) ×1 IMPLANT
BNDG COHESIVE 4X5 TAN ST LF (GAUZE/BANDAGES/DRESSINGS) ×2 IMPLANT
CANISTER SUCT 3000ML PPV (MISCELLANEOUS) ×1 IMPLANT
CNTNR URN SCR LID CUP LEK RST (MISCELLANEOUS) IMPLANT
CONT SPEC 4OZ STRL OR WHT (MISCELLANEOUS)
COVER SURGICAL LIGHT HANDLE (MISCELLANEOUS) ×2 IMPLANT
DRAIN CHANNEL 19F RND (DRAIN) IMPLANT
DRAIN JP 10F RND SILICONE (MISCELLANEOUS) IMPLANT
DRAPE HALF SHEET 40X57 (DRAPES) ×2 IMPLANT
DRAPE IMP U-DRAPE 54X76 (DRAPES) ×1 IMPLANT
DRAPE INCISE IOBAN 66X45 STRL (DRAPES) ×2 IMPLANT
DRAPE LAPAROSCOPIC ABDOMINAL (DRAPES) IMPLANT
DRAPE LAPAROTOMY 100X72 PEDS (DRAPES) ×1 IMPLANT
DRAPE PERI GROIN 82X75IN TIB (DRAPES) ×1 IMPLANT
DRESSING MEPILEX FLEX 4X4 (GAUZE/BANDAGES/DRESSINGS) IMPLANT
DRSG ADAPTIC 3X8 NADH LF (GAUZE/BANDAGES/DRESSINGS) IMPLANT
DRSG CUTIMED SORBACT 7X9 (GAUZE/BANDAGES/DRESSINGS) ×2 IMPLANT
DRSG MEPILEX FLEX 4X4 (GAUZE/BANDAGES/DRESSINGS) ×2
DRSG PAD ABDOMINAL 8X10 ST (GAUZE/BANDAGES/DRESSINGS) IMPLANT
DRSG VAC ATS LRG SENSATRAC (GAUZE/BANDAGES/DRESSINGS) ×2 IMPLANT
DRSG VAC ATS MED SENSATRAC (GAUZE/BANDAGES/DRESSINGS) IMPLANT
DRSG VAC ATS SM SENSATRAC (GAUZE/BANDAGES/DRESSINGS) IMPLANT
ELECT CAUTERY BLADE 6.4 (BLADE) IMPLANT
ELECT REM PT RETURN 9FT ADLT (ELECTROSURGICAL) ×2
ELECTRODE REM PT RTRN 9FT ADLT (ELECTROSURGICAL) ×1 IMPLANT
GAUZE SPONGE 2X2 8PLY STRL LF (GAUZE/BANDAGES/DRESSINGS) IMPLANT
GAUZE SPONGE 4X4 12PLY STRL (GAUZE/BANDAGES/DRESSINGS) ×3 IMPLANT
GLOVE SURG ENC MOIS LTX SZ6.5 (GLOVE) ×2 IMPLANT
GLOVE SURG ENC TEXT LTX SZ6.5 (GLOVE) ×2 IMPLANT
GOWN STRL REUS W/ TWL LRG LVL3 (GOWN DISPOSABLE) ×3 IMPLANT
GOWN STRL REUS W/TWL LRG LVL3 (GOWN DISPOSABLE) ×6
GRAFT MYRIAD 20X20 (Graft) ×1 IMPLANT
KIT BASIN OR (CUSTOM PROCEDURE TRAY) ×2 IMPLANT
KIT TURNOVER KIT B (KITS) ×2 IMPLANT
MANIFOLD NEPTUNE II (INSTRUMENTS) ×1 IMPLANT
MATRIX WOUND 3-LAYER 10X15 (Tissue) ×1 IMPLANT
MICROMATRIX 1000MG (Tissue) ×4 IMPLANT
NDL HYPO 25GX1X1/2 BEV (NEEDLE) ×1 IMPLANT
NEEDLE HYPO 25GX1X1/2 BEV (NEEDLE) IMPLANT
NS IRRIG 1000ML POUR BTL (IV SOLUTION) ×2 IMPLANT
PACK GENERAL/GYN (CUSTOM PROCEDURE TRAY) ×2 IMPLANT
PACK UNIVERSAL I (CUSTOM PROCEDURE TRAY) ×2 IMPLANT
PAD ARMBOARD 7.5X6 YLW CONV (MISCELLANEOUS) ×3 IMPLANT
POWDER MYRIAD MORCELLS 1000MG (Miscellaneous) ×3 IMPLANT
POWDER MYRIAD MORCELLS 500MG (Miscellaneous) ×2 IMPLANT
SOLUTION PARTIC MCRMTRX 1000MG (Tissue) IMPLANT
SPONGE GAUZE 2X2 STER 10/PKG (GAUZE/BANDAGES/DRESSINGS) ×1
STAPLER VISISTAT 35W (STAPLE) ×1 IMPLANT
STOCKINETTE IMPERVIOUS LG (DRAPES) ×2 IMPLANT
SURGILUBE 2OZ TUBE FLIPTOP (MISCELLANEOUS) IMPLANT
SUT MNCRL AB 3-0 PS2 18 (SUTURE) ×1 IMPLANT
SUT MNCRL AB 3-0 PS2 27 (SUTURE) IMPLANT
SUT MNCRL AB 4-0 PS2 18 (SUTURE) ×3 IMPLANT
SUT MON AB 2-0 CT1 36 (SUTURE) IMPLANT
SUT MON AB 5-0 PS2 18 (SUTURE) IMPLANT
SUT VIC AB 5-0 PS2 18 (SUTURE) IMPLANT
SUT VICRYL 3 0 (SUTURE) IMPLANT
SWAB COLLECTION DEVICE MRSA (MISCELLANEOUS) IMPLANT
SWAB CULTURE ESWAB REG 1ML (MISCELLANEOUS) IMPLANT
SYR 50ML LL SCALE MARK (SYRINGE) ×1 IMPLANT
SYR BULB IRRIG 60ML STRL (SYRINGE) ×1 IMPLANT
SYR CONTROL 10ML LL (SYRINGE) ×1 IMPLANT
TOWEL GREEN STERILE (TOWEL DISPOSABLE) ×2 IMPLANT
UNDERPAD 30X36 HEAVY ABSORB (UNDERPADS AND DIAPERS) ×4 IMPLANT

## 2021-07-18 NOTE — Op Note (Addendum)
DATE OF OPERATION: 07/18/2021  LOCATION: [facility] Operating Room Inpatient  PREOPERATIVE DIAGNOSIS: Bilateral thigh wounds  POSTOPERATIVE DIAGNOSIS: Same  PROCEDURE: Excision of bilateral thigh wounds Right:  Sharp excision of skin, soft tissue and muscle 10 x 15 cm Right:  Placement of Acell powder 2 gm and 10 x 15 cm sheet after excisional debridement Right:  Placement of VAC to 10 x 15 cm wound Left:  Excision of skin 10 x 20 cm  Left:  Placement of Myriad powder 4 gm and 10 x 20 sheet  Left:  Placement of VAC to 10 x 20 cm wound  SURGEON: Takai Chiaramonte [place] Terron Merfeld, DO  ASSISTANT: Roetta Sessions, PA  EBL: 10 cc  CONDITION: Stable  COMPLICATIONS: None  INDICATION: The patient, Wayne Hunter, is a 26 y.o. male born on 1995/10/08, is here for treatment of two severe wounds to the thighs of each leg.  He underwent excisional debridement two days ago and returns today for further debridement.   PROCEDURE DETAILS:  The patient was seen prior to surgery and marked.  The IV antibiotics were given. The patient was taken to the operating room and given a general anesthetic. A standard time out was performed and all information was confirmed by those in the room. The patient was prepped and draped.    Left: The wound was irrigated with saline and an antibiotic solution.  The #10 blade was used to excise the nonviable skin of the 10 x 20 cm wound.  There was 20 cm of undermining proximal and 10 cm distal.  The deep layer looked clean.  No sign of infection.  All of the Myriad powder and sheet was applied.  The sorbact was applied and tacked in place with the 5-0 Vicryl.  The vac was placed and layered with two.  There was an excellent seal.    Right: The wound was irrigated with saline and antibiotic solution.  The #10 blade was used to sharply excise the nonviable skin, soft tissue, fat and muscle of the 10 x 15 cm wound.  There was 20 cm of undermining distal and 10 cm of undermining  proximal.  All of the Acell powder and sheet was placed and covered with the sorbact.  The VAC was layered with two layers.  There was an excellent seal.  The patient was allowed to wake up and taken to recovery room in stable condition at the end of the case. The family was notified at the end of the case.   The advanced practice practitioner (APP) assisted throughout the case.  The APP was essential in retraction and counter traction when needed to make the case progress smoothly.  This retraction and assistance made it possible to see the tissue plans for the procedure.  The assistance was needed for blood control, tissue re-approximation and assisted with closure of the incision site.

## 2021-07-18 NOTE — Anesthesia Postprocedure Evaluation (Signed)
Anesthesia Post Note  Patient: Wayne Hunter  Procedure(s) Performed: IRRIGATION AND DEBRIDEMENT WOUND (Bilateral: Thigh) APPLICATION OF WOUND VAC (Bilateral: Thigh) APPLICATION OF SKIN SUBSTITUTE MYRIAD and ACELL (Bilateral: Thigh)     Patient location during evaluation: PACU Anesthesia Type: General Level of consciousness: awake and alert Pain management: pain level controlled Vital Signs Assessment: post-procedure vital signs reviewed and stable Respiratory status: spontaneous breathing, nonlabored ventilation, respiratory function stable and patient connected to nasal cannula oxygen Cardiovascular status: blood pressure returned to baseline and stable Postop Assessment: no apparent nausea or vomiting Anesthetic complications: no   No notable events documented.  Last Vitals:  Vitals:   07/18/21 1615 07/18/21 1634  BP: 115/81 122/67  Pulse: (!) 107 (!) 113  Resp: (!) 21 18  Temp:  36.4 C  SpO2: 95% 94%    Last Pain:  Vitals:   07/18/21 1634  TempSrc: Oral  PainSc:                  Nihal Marzella
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
## 2021-07-18 NOTE — Progress Notes (Signed)
Patient arrived back to [facility] room 15. Alert and oriented. Pain level 0. Bed in lowest position. Call light in reach.
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
## 2021-07-18 NOTE — Transfer of Care (Signed)
Immediate Anesthesia Transfer of Care Note  Patient: Wayne Hunter  Procedure(s) Performed: IRRIGATION AND DEBRIDEMENT WOUND (Bilateral: Thigh) APPLICATION OF WOUND VAC (Bilateral: Thigh) APPLICATION OF SKIN SUBSTITUTE MYRIAD and ACELL (Bilateral: Thigh)  Patient Location: PACU  Anesthesia Type:General  Level of Consciousness: drowsy  Airway & Oxygen Therapy: Patient Spontanous Breathing  Post-op Assessment: Report given to RN and Post -op Vital signs reviewed and stable  Post vital signs: Reviewed and stable  Last Vitals:  Vitals Value Taken Time  BP    Temp    Pulse    Resp    SpO2      Last Pain:  Vitals:   07/18/21 1158  TempSrc: Oral  PainSc:       Patients Stated Pain Goal: 2 (85/20/74 0979)  Complications: No notable events documented.

## 2021-07-18 NOTE — Progress Notes (Signed)
PT Cancellation Note  Patient Details Name: Wayne Hunter MRN: [id number] DOB: February 25, 1996   Cancelled Treatment:    Reason Eval/Treat Not Completed: Medical issues which prohibited therapy (Pt going back to OR for graft and I&D.)   Bevelyn Buckles 07/18/2021, 2:13 PM Trevell Pariseau M,PT Acute Rehab Services [phone] (pager)

## 2021-07-18 NOTE — TOC Progression Note (Addendum)
Transition of Care [facility]) - Progression Note    Patient Details  Name: Wayne Hunter MRN: [id number] Date of Birth: 27-Jun-1996  [facility] of [facility]) CM/SW Contact  Jacalyn Lefevre, Edson Snowball, RN Phone Number: 07/18/2021, 3:44 PM  Clinical Narrative:     Updated Workers Comp Case Manager is Bishop Limbo with [place] [employer] phone [phone] fax [phone] . Faxed op note Home health orders . Workers Comp Tourist information centre manager is Bishop Limbo with [employer] will start arranging home health . NCM also updated Olivia Mackie with [employer] / [employer] . Olivia Mackie with [employer] will email another home VAC application to DR Dillingham to sign  Expected Discharge Plan: [place] Barriers to Discharge: Continued Medical Work up  Expected Discharge Plan and Services Expected Discharge Plan: [place]                         DME Arranged: Vac DME Agency: [employer] Date DME Agency Contacted: 07/17/21 Time DME Agency Contacted: 0800 Representative spoke with at DME Agency: [place] ([place]) Interventions    Readmission Risk Interventions No flowsheet data found.

## 2021-07-18 NOTE — Anesthesia Procedure Notes (Addendum)
Procedure Name: LMA Insertion Date/Time: 07/18/2021 1:28 PM Performed by: Kara Mead, CRNA Pre-anesthesia Checklist: Patient identified, Emergency Drugs available, Suction available, Patient being monitored and Timeout performed Patient Re-evaluated:Patient Re-evaluated prior to induction Oxygen Delivery Method: Circle system utilized Preoxygenation: Pre-oxygenation with 100% oxygen Induction Type: IV induction Ventilation: Mask ventilation without difficulty LMA: LMA inserted LMA Size: 4.0 Number of attempts: 1 Dental Injury: Teeth and Oropharynx as per pre-operative assessment

## 2021-07-19 ENCOUNTER — Encounter (HOSPITAL_COMMUNITY): Payer: Self-pay | Admitting: Plastic Surgery

## 2021-07-19 LAB — BASIC METABOLIC PANEL
Anion gap: 9 (ref 5–15)
BUN: 10 mg/dL (ref 6–20)
CO2: 26 mmol/L (ref 22–32)
Calcium: 8.7 mg/dL — ABNORMAL LOW (ref 8.9–10.3)
Chloride: 101 mmol/L (ref 98–111)
Creatinine, Ser: 0.57 mg/dL — ABNORMAL LOW (ref 0.61–1.24)
GFR, Estimated: >60 mL/min (ref >60)
Glucose, Bld: 124 mg/dL — ABNORMAL HIGH (ref 70–99)
Potassium: 4.3 mmol/L (ref 3.5–5.1)
Sodium: 136 mmol/L (ref 135–145)

## 2021-07-19 NOTE — Progress Notes (Signed)
Wayne Hunter did require some pain medications this evening for spasms and pain in his legs. Oral pain medications worked well for his pain and he was able to rest. His LLE is more swollen than his RLE. SO states this is changed from prior to surgery. Both extremities exhibit the same warmth and he is not having any increased pain in the LLE greater than the RLE. Both LE appear the same as from my previous assessment this evening. Patient has no new complaints. It has been my pleasure to care for Wayne Hunter this evening.

## 2021-07-19 NOTE — Progress Notes (Signed)
Patient's SO noticed that the patients LLE is more swollen post surgery. I told her that some swelling after surgery is not uncommon. She states that the patient was given a special pillow to use at home that helped with the swelling and it is in her car. I offered to find pillows and elevate the extremity but they declined stating that she would go get the pillow from the car in the morning. Patient is refusing ice to the affected area at this time.

## 2021-07-19 NOTE — Progress Notes (Addendum)
Mobility Specialist Progress Note:   07/19/21 1210  Mobility  Activity Transferred:  Chair to bed  Level of Assistance Minimal assist, patient does 75% or more (+2)  Assistive Device Front wheel walker  RLE Weight Bearing WBAT  Distance Ambulated (ft) 2 ft  Mobility Response Tolerated fair  Mobility performed by Mobility specialist;Nurse  $Mobility charge 1 Mobility   Pt requesting to get back to bed. Required minG to stand from chair, minA to assist pivoting to EOB. Pt was able to pivot/scoot foot to get to side of bed. Left in bed with all needs met.   Nelta Numbers Mobility Specialist  Phone [phone]

## 2021-07-19 NOTE — Progress Notes (Signed)
°   07/19/21 1430  Mobility  Activity Refused mobility (Patient asleep; will check back as time permits)

## 2021-07-19 NOTE — Care Management (Signed)
Updated Workers Comp Sports coach is Karna Christmas with [place] [employer] phone [phone] fax [phone] . [facility] asking when potential discharge is. NCM called Dr Kittie Plater office awaiting call back.
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
## 2021-07-20 LAB — TYPE AND SCREEN
ABO/RH(D): O POS
Antibody Screen: NEGATIVE
Unit division: 0
Unit division: 0
Unit division: 0

## 2021-07-20 LAB — BPAM RBC
Blood Product Expiration Date: 202301252359
Blood Product Expiration Date: 202301252359
Blood Product Expiration Date: 202301252359
ISSUE DATE / TIME: 202301040436
ISSUE DATE / TIME: 202301040852
Unit Type and Rh: 5100
Unit Type and Rh: 5100
Unit Type and Rh: 5100

## 2021-07-20 LAB — VITAMIN C: Vitamin C: 0.4 mg/dL (ref 0.4–2.0)

## 2021-07-20 LAB — ZINC: Zinc: 81 ug/dL (ref 44–115)

## 2021-07-20 NOTE — Plan of Care (Signed)
  Problem: Education: Goal: Knowledge of General Education information will improve Description Including pain rating scale, medication(s)/side effects and non-pharmacologic comfort measures Outcome: Progressing   Problem: Health Behavior/Discharge Planning: Goal: Ability to manage health-related needs will improve Outcome: Progressing   

## 2021-07-21 LAB — VITAMIN A: Vitamin A (Retinoic Acid): 25 ug/dL (ref 18.9–57.3)

## 2021-07-22 ENCOUNTER — Inpatient Hospital Stay (HOSPITAL_COMMUNITY): Payer: PRIVATE HEALTH INSURANCE | Admitting: Certified Registered"

## 2021-07-22 ENCOUNTER — Encounter (HOSPITAL_COMMUNITY): Payer: Self-pay | Admitting: Orthopedic Surgery

## 2021-07-22 ENCOUNTER — Encounter (HOSPITAL_COMMUNITY)
Admission: RE | Disposition: A | Discharge status: Rehabilitation Facility | Payer: Self-pay | Source: Home / Self Care | Attending: Plastic Surgery

## 2021-07-22 DIAGNOSIS — S71102D Unspecified open wound, left thigh, subsequent encounter: Secondary | ICD-10-CM

## 2021-07-22 DIAGNOSIS — L089 Local infection of the skin and subcutaneous tissue, unspecified: Secondary | ICD-10-CM

## 2021-07-22 DIAGNOSIS — S71101D Unspecified open wound, right thigh, subsequent encounter: Secondary | ICD-10-CM

## 2021-07-22 HISTORY — PX: APPLICATION OF WOUND VAC: SHX5189

## 2021-07-22 HISTORY — PX: INCISION AND DRAINAGE OF WOUND: SHX1803

## 2021-07-22 LAB — CBC WITH DIFFERENTIAL/PLATELET
Abs Immature Granulocytes: 0.24 10*3/uL — ABNORMAL HIGH (ref 0.00–0.07)
Basophils Absolute: 0.1 10*3/uL (ref 0.0–0.1)
Basophils Relative: 1 %
Eosinophils Absolute: 0.1 10*3/uL (ref 0.0–0.5)
Eosinophils Relative: 1 %
HCT: 28.2 % — ABNORMAL LOW (ref 39.0–52.0)
Hemoglobin: 8.7 g/dL — ABNORMAL LOW (ref 13.0–17.0)
Immature Granulocytes: 3 %
Lymphocytes Relative: 17 %
Lymphs Abs: 1.5 10*3/uL (ref 0.7–4.0)
MCH: 24.6 pg — ABNORMAL LOW (ref 26.0–34.0)
MCHC: 30.9 g/dL (ref 30.0–36.0)
MCV: 79.9 fL — ABNORMAL LOW (ref 80.0–100.0)
Monocytes Absolute: 0.5 10*3/uL (ref 0.1–1.0)
Monocytes Relative: 6 %
Neutro Abs: 6.3 10*3/uL (ref 1.7–7.7)
Neutrophils Relative %: 72 %
Platelets: 585 10*3/uL — ABNORMAL HIGH (ref 150–400)
RBC: 3.53 MIL/uL — ABNORMAL LOW (ref 4.22–5.81)
RDW: 19.9 % — ABNORMAL HIGH (ref 11.5–15.5)
WBC: 8.7 10*3/uL (ref 4.0–10.5)
nRBC: 0 % (ref 0.0–0.2)

## 2021-07-22 SURGERY — IRRIGATION AND DEBRIDEMENT WOUND
Anesthesia: General | Site: Thigh | Laterality: Bilateral

## 2021-07-22 MED ORDER — ORAL CARE MOUTH RINSE: 15.0000 mL | Freq: Once | OROMUCOSAL | Status: DC

## 2021-07-22 MED ORDER — CHLORHEXIDINE GLUCONATE 0.12 % MT SOLN: 15.0000 mL | Freq: Once | OROMUCOSAL | Status: DC

## 2021-07-22 MED ORDER — DEXAMETHASONE SODIUM PHOSPHATE 10 MG/ML IJ SOLN
INTRAMUSCULAR | Status: DC | PRN
  Administered 2021-07-22: 5 mg via INTRAVENOUS

## 2021-07-22 MED ORDER — CEFAZOLIN SODIUM-DEXTROSE 2-4 GM/100ML-% IV SOLN
2.0000 g | INTRAVENOUS | Status: AC
  Administered 2021-07-22: 2 g via INTRAVENOUS

## 2021-07-22 MED ORDER — HYDROMORPHONE HCL 1 MG/ML IJ SOLN: 0.2500 mg | INTRAMUSCULAR | Status: DC | PRN

## 2021-07-22 MED ORDER — FENTANYL CITRATE (PF) 250 MCG/5ML IJ SOLN
INTRAMUSCULAR | Status: DC | PRN
  Administered 2021-07-22: 100 ug via INTRAVENOUS

## 2021-07-22 MED ORDER — LIDOCAINE HCL (CARDIAC) PF 100 MG/5ML IV SOSY
PREFILLED_SYRINGE | INTRAVENOUS | Status: DC | PRN
  Administered 2021-07-22: 100 mg via INTRAVENOUS

## 2021-07-22 MED ORDER — PROPOFOL 10 MG/ML IV BOLUS
INTRAVENOUS | Status: DC | PRN
  Administered 2021-07-22: 300 mg via INTRAVENOUS

## 2021-07-22 MED ORDER — ONDANSETRON HCL 4 MG/2ML IJ SOLN
INTRAMUSCULAR | Status: AC
  Filled 2021-07-22: qty 2

## 2021-07-22 MED ORDER — MIDAZOLAM HCL 2 MG/2ML IJ SOLN
INTRAMUSCULAR | Status: DC | PRN
  Administered 2021-07-22: 2 mg via INTRAVENOUS

## 2021-07-22 MED ORDER — HYDROMORPHONE HCL 1 MG/ML IJ SOLN
INTRAMUSCULAR | Status: AC
  Filled 2021-07-22: qty 0.5

## 2021-07-22 MED ORDER — CHLORHEXIDINE GLUCONATE 0.12 % MT SOLN
OROMUCOSAL | Status: AC
  Filled 2021-07-22: qty 15

## 2021-07-22 MED ORDER — CEFAZOLIN SODIUM-DEXTROSE 2-4 GM/100ML-% IV SOLN
INTRAVENOUS | Status: AC
  Filled 2021-07-22: qty 100

## 2021-07-22 MED ORDER — LACTATED RINGERS IV SOLN: INTRAVENOUS | Status: DC | PRN

## 2021-07-22 MED ORDER — ACETAMINOPHEN 500 MG PO TABS
ORAL_TABLET | ORAL | Status: AC
  Administered 2021-07-22: 1000 mg via ORAL
  Filled 2021-07-22: qty 2

## 2021-07-22 MED ORDER — PROMETHAZINE HCL 25 MG/ML IJ SOLN: 6.2500 mg | INTRAMUSCULAR | Status: DC | PRN

## 2021-07-22 MED ORDER — HYDROMORPHONE HCL 1 MG/ML IJ SOLN
INTRAMUSCULAR | Status: DC | PRN
Start: 2021-07-22 — End: 2021-07-22
  Administered 2021-07-22 (×3): .5 mg via INTRAVENOUS

## 2021-07-22 MED ORDER — KETAMINE HCL-SODIUM CHLORIDE 100-0.9 MG/10ML-% IV SOSY
PREFILLED_SYRINGE | INTRAVENOUS | Status: DC | PRN
  Administered 2021-07-22: 30 mg via INTRAVENOUS
  Administered 2021-07-22: 20 mg via INTRAVENOUS

## 2021-07-22 MED ORDER — LACTATED RINGERS IV SOLN: INTRAVENOUS | Status: DC

## 2021-07-22 MED ORDER — PROPOFOL 10 MG/ML IV BOLUS
INTRAVENOUS | Status: AC
  Filled 2021-07-22: qty 20

## 2021-07-22 MED ORDER — DEXAMETHASONE SODIUM PHOSPHATE 10 MG/ML IJ SOLN
INTRAMUSCULAR | Status: AC
  Filled 2021-07-22: qty 1

## 2021-07-22 MED ORDER — CHLORHEXIDINE GLUCONATE CLOTH 2 % EX PADS
6.0000 | MEDICATED_PAD | Freq: Once | CUTANEOUS | Status: AC
  Administered 2021-07-22: 6 via TOPICAL

## 2021-07-22 MED ORDER — MEPERIDINE HCL 25 MG/ML IJ SOLN: 6.2500 mg | INTRAMUSCULAR | Status: DC | PRN

## 2021-07-22 MED ORDER — LIDOCAINE 2% (20 MG/ML) 5 ML SYRINGE
INTRAMUSCULAR | Status: AC
  Filled 2021-07-22: qty 5

## 2021-07-22 MED ORDER — DEXMEDETOMIDINE (PRECEDEX) IN NS 20 MCG/5ML (4 MCG/ML) IV SYRINGE
PREFILLED_SYRINGE | INTRAVENOUS | Status: DC | PRN
  Administered 2021-07-22: 12 ug via INTRAVENOUS
  Administered 2021-07-22: 8 ug via INTRAVENOUS

## 2021-07-22 MED ORDER — ACETAMINOPHEN 500 MG PO TABS: 1000.0000 mg | ORAL_TABLET | Freq: Once | ORAL | Status: AC

## 2021-07-22 MED ORDER — MIDAZOLAM HCL 2 MG/2ML IJ SOLN
INTRAMUSCULAR | Status: AC
  Filled 2021-07-22: qty 2

## 2021-07-22 MED ORDER — DEXMEDETOMIDINE (PRECEDEX) IN NS 20 MCG/5ML (4 MCG/ML) IV SYRINGE
PREFILLED_SYRINGE | INTRAVENOUS | Status: AC
  Filled 2021-07-22: qty 5

## 2021-07-22 MED ORDER — 0.9 % SODIUM CHLORIDE (POUR BTL) OPTIME
TOPICAL | Status: DC | PRN
  Administered 2021-07-22: 1000 mL

## 2021-07-22 MED ORDER — KETAMINE HCL 50 MG/5ML IJ SOSY
PREFILLED_SYRINGE | INTRAMUSCULAR | Status: AC
  Filled 2021-07-22: qty 5

## 2021-07-22 MED ORDER — ROCURONIUM BROMIDE 10 MG/ML (PF) SYRINGE
PREFILLED_SYRINGE | INTRAVENOUS | Status: AC
  Filled 2021-07-22: qty 10

## 2021-07-22 MED ORDER — LIDOCAINE-EPINEPHRINE 1 %-1:100000 IJ SOLN
INTRAMUSCULAR | Status: AC
  Filled 2021-07-22: qty 1

## 2021-07-22 MED ORDER — ONDANSETRON HCL 4 MG/2ML IJ SOLN
INTRAMUSCULAR | Status: DC | PRN
  Administered 2021-07-22: 4 mg via INTRAVENOUS

## 2021-07-22 MED ORDER — FENTANYL CITRATE (PF) 250 MCG/5ML IJ SOLN
INTRAMUSCULAR | Status: AC
  Filled 2021-07-22: qty 5

## 2021-07-22 MED ORDER — OXYCODONE HCL 5 MG/5ML PO SOLN: 5.0000 mg | Freq: Once | ORAL | Status: DC | PRN

## 2021-07-22 MED ORDER — LIDOCAINE-EPINEPHRINE 1 %-1:100000 IJ SOLN
INTRAMUSCULAR | Status: DC | PRN
  Administered 2021-07-22: 20 mL

## 2021-07-22 MED ORDER — CHLORHEXIDINE GLUCONATE CLOTH 2 % EX PADS
6.0000 | MEDICATED_PAD | Freq: Once | CUTANEOUS | Status: AC
  Administered 2021-07-23: 6 via TOPICAL

## 2021-07-22 MED ORDER — OXYCODONE HCL 5 MG PO TABS: 5.0000 mg | ORAL_TABLET | Freq: Once | ORAL | Status: DC | PRN

## 2021-07-22 SURGICAL SUPPLY — 44 items
APL SKNCLS STERI-STRIP NONHPOA (GAUZE/BANDAGES/DRESSINGS) ×1
BAG COUNTER SPONGE SURGICOUNT (BAG) ×2 IMPLANT
BAG SPNG CNTER NS LX DISP (BAG) ×1
BENZOIN TINCTURE PRP APPL 2/3 (GAUZE/BANDAGES/DRESSINGS) ×2 IMPLANT
CANISTER SUCT 3000ML PPV (MISCELLANEOUS) ×2 IMPLANT
CANISTER WOUNDNEG PRESSURE 500 (CANNISTER) ×2 IMPLANT
COVER SURGICAL LIGHT HANDLE (MISCELLANEOUS) ×2 IMPLANT
DRAIN JP 10F RND SILICONE (MISCELLANEOUS) IMPLANT
DRAPE DERMATAC (DRAPES) ×2 IMPLANT
DRAPE HALF SHEET 40X57 (DRAPES) ×2 IMPLANT
DRAPE INCISE IOBAN 66X45 STRL (DRAPES) ×1 IMPLANT
DRAPE LAPAROTOMY 100X72 PEDS (DRAPES) ×2 IMPLANT
DRAPE ORTHO SPLIT 77X108 STRL (DRAPES) ×4
DRAPE SURG ORHT 6 SPLT 77X108 (DRAPES) IMPLANT
DRESSING MEPILEX FLEX 4X4 (GAUZE/BANDAGES/DRESSINGS) IMPLANT
DRSG MEPILEX FLEX 4X4 (GAUZE/BANDAGES/DRESSINGS) ×2
DRSG VAC ATS MED SENSATRAC (GAUZE/BANDAGES/DRESSINGS) ×2 IMPLANT
ELECT CAUTERY BLADE 6.4 (BLADE) ×1 IMPLANT
ELECT REM PT RETURN 9FT ADLT (ELECTROSURGICAL) ×2
ELECTRODE REM PT RTRN 9FT ADLT (ELECTROSURGICAL) ×1 IMPLANT
GLOVE SURG ENC MOIS LTX SZ6.5 (GLOVE) ×2 IMPLANT
GLOVE SURG ENC TEXT LTX SZ6.5 (GLOVE) ×2 IMPLANT
GOWN STRL REUS W/ TWL LRG LVL3 (GOWN DISPOSABLE) ×3 IMPLANT
GOWN STRL REUS W/TWL LRG LVL3 (GOWN DISPOSABLE) ×6
GRAFT MYRIAD 3 LAYER 7X10 (Graft) ×1 IMPLANT
KIT BASIN OR (CUSTOM PROCEDURE TRAY) ×2 IMPLANT
KIT TURNOVER KIT B (KITS) ×2 IMPLANT
MICROMATRIX 1000MG (Tissue) ×2 IMPLANT
NDL HYPO 25GX1X1/2 BEV (NEEDLE) ×1 IMPLANT
NEEDLE HYPO 25GX1X1/2 BEV (NEEDLE) ×2 IMPLANT
NS IRRIG 1000ML POUR BTL (IV SOLUTION) ×2 IMPLANT
PACK GENERAL/GYN (CUSTOM PROCEDURE TRAY) ×2 IMPLANT
PACK UNIVERSAL I (CUSTOM PROCEDURE TRAY) ×2 IMPLANT
PAD ARMBOARD 7.5X6 YLW CONV (MISCELLANEOUS) ×4 IMPLANT
POWDER MYRIAD MORCELLS 1000MG (Miscellaneous) ×1 IMPLANT
SOLUTION PARTIC MCRMTRX 1000MG (Tissue) IMPLANT
STAPLER VISISTAT 35W (STAPLE) ×2 IMPLANT
STOCKINETTE IMPERVIOUS LG (DRAPES) ×2 IMPLANT
SURGILUBE 2OZ TUBE FLIPTOP (MISCELLANEOUS) ×1 IMPLANT
SUT MON AB 5-0 PS2 18 (SUTURE) ×2 IMPLANT
SUT VIC AB 5-0 PS2 18 (SUTURE) ×3 IMPLANT
SYR CONTROL 10ML LL (SYRINGE) ×2 IMPLANT
TOWEL GREEN STERILE (TOWEL DISPOSABLE) ×2 IMPLANT
UNDERPAD 30X36 HEAVY ABSORB (UNDERPADS AND DIAPERS) ×2 IMPLANT

## 2021-07-22 NOTE — Care Management (Addendum)
Spoke to [employer] PA , potential discharge tomorrow with 2 VAC's.   NCM spoke to patient's wife at bedside  discussed PT recommendations for home health , however, PT stated patient needs a lot of help. Wife was present when patient worked with PT and is comfortable with him going home.   NCM updated Blossom Hoops with [employer].   NCM left  Workers Comp Case Production designer, theatre/television/film , Karna Christmas with [facility] [employer] phone [phone] fax [phone] a message.  Karna Christmas aware of potential discharge tomorrow. Once home health orders signed by MD, NCM will refax. NCM faxed order for 3 in 1, patient has wheelchair and walker.   [facility] will start arranging home health PT,OT,RN     1648 Both home [employer] VAC's delivered to patient's hospital room. Workers Comp still working on Surveyor, mining
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
## 2021-07-22 NOTE — Anesthesia Procedure Notes (Addendum)
Procedure Name: LMA Insertion Date/Time: 07/22/2021 3:18 PM Performed by: Lovie Chol, CRNA Pre-anesthesia Checklist: Patient identified, Emergency Drugs available, Patient being monitored and Suction available Patient Re-evaluated:Patient Re-evaluated prior to induction Oxygen Delivery Method: Circle system utilized Preoxygenation: Pre-oxygenation with 100% oxygen Induction Type: IV induction LMA Size: 4.0 Tube type: Oral Number of attempts: 1 Placement Confirmation: positive ETCO2 Tube secured with: Tape Dental Injury: Teeth and Oropharynx as per pre-operative assessment

## 2021-07-22 NOTE — Transfer of Care (Signed)
Immediate Anesthesia Transfer of Care Note  Patient: Wayne Hunter  Procedure(s) Performed: IRRIGATION AND DEBRIDEMENT WOUND (Bilateral: Thigh) WOUND VAC CHANGE (Bilateral: Thigh) APPLICATION OFACELL and MYRIAD (Bilateral: Thigh)  Patient Location: PACU  Anesthesia Type:General  Level of Consciousness: oriented, sedated, drowsy and patient cooperative  Airway & Oxygen Therapy: Patient Spontanous Breathing and Patient connected to nasal cannula oxygen  Post-op Assessment: Report given to RN, Post -op Vital signs reviewed and stable, Patient moving all extremities and Patient moving all extremities X 4  Post vital signs: Reviewed and stable  Last Vitals:  Vitals Value Taken Time  BP 119/75 07/22/21 1611  Temp    Pulse 123 07/22/21 1617  Resp 21 07/22/21 1617  SpO2 97 % 07/22/21 1617  Vitals shown include unvalidated device data.  Last Pain:  Vitals:   07/22/21 1506  TempSrc:   PainSc: 0-No pain      Patients Stated Pain Goal: 3 (78/67/67 2094)  Complications: No notable events documented.
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
## 2021-07-22 NOTE — Interval H&P Note (Signed)
History and Physical Interval Note:  07/22/2021 2:55 PM  Wayne Hunter  has presented today for surgery, with the diagnosis of SOFT TISSUE INFECTION.  The various methods of treatment have been discussed with the patient and family. After consideration of risks, benefits and other options for treatment, the patient has consented to  Procedure(s): IRRIGATION AND DEBRIDEMENT WOUND (Bilateral) WOUND VAC CHANGE (Bilateral) APPLICATION OFACELL OR MYRIAD (Bilateral) as a surgical intervention.  The patient's history has been reviewed, patient examined, no change in status, stable for surgery.  I have reviewed the patient's chart and labs.  Questions were answered to the patient's satisfaction.     [name] [name]

## 2021-07-22 NOTE — H&P (View-Only) (Signed)
4 Days Post-Op  °Subjective: °Patient resting in bed, spouse at bedside. °He reports she is doing well, reports pain is reasonably well controlled.  Does not report any increased pain.  He is not having any infectious symptoms, denies fevers, chills. ° °Of note patient did have a left femoral vein, popliteal vein and calf vein DVT, currently on Eliquis which is being managed by vascular surgery as outpatient prior to admission to the hospital. ° °Objective: °Vital signs in last 24 hours: °Temp:  [97.5 °F (36.4 °C)-99 °F (37.2 °C)] 98.7 °F (37.1 °C) (01/09 0733) °Pulse Rate:  [100-111] 105 (01/09 0733) °Resp:  [16-19] 16 (01/09 0733) °BP: (99-133)/(50-72) 122/68 (01/09 0733) °SpO2:  [95 %-100 %] 99 % (01/09 0733) °Last BM Date: 07/21/21 ° °Intake/Output from previous day: °01/08 0701 - 01/09 0700 °In: 1880 [P.O.:1880] °Out: 1200 [Urine:1000; Drains:200] °Intake/Output this shift: °No intake/output data recorded. ° °General appearance: alert, cooperative, no distress, and resting in bed, spouse at bedside °Head: Normocephalic, without obvious abnormality, atraumatic °Resp: Unlabored °Extremities: Mild swelling noted of bilateral lower extremities, bilateral DP pulse noted.  No erythema or cellulitic changes noted.  Bilateral calves are nontender, no pitting edema noted. °Pulses: 2+ and symmetric °Skin: Skin color, texture, turgor normal. No rashes or lesions °Incision/Wound: °Bilateral wound vacs in place, good seal noted.  There is no surrounding erythema of either thigh or wound VAC site.  There is no areas of fluctuance noted with palpation.  I do not appreciate any significant areas of tenderness with palpation.  Bilateral wound VAC canisters with serosanguineous drainage and some tissue noted within the canister.  Approximately 250 to 300 cc per canister.  Sensation and function intact. ° °Lab Results:  °CBC Latest Ref Rng & Units 07/22/2021 07/18/2021 07/17/2021  °WBC 4.0 - 10.5 K/uL 8.7 9.0 -  °Hemoglobin 13.0 - 17.0  g/dL 8.7(L) 8.6(L) 8.7(L)  °Hematocrit 39.0 - 52.0 % 28.2(L) 27.8(L) 27.2(L)  °Platelets 150 - 400 K/uL 585(H) 560(H) -  °  °BMET °No results for input(s): NA, K, CL, CO2, GLUCOSE, BUN, CREATININE, CALCIUM in the last 72 hours. °PT/INR °No results for input(s): LABPROT, INR in the last 72 hours. °ABG °No results for input(s): PHART, HCO3 in the last 72 hours. ° °Invalid input(s): PCO2, PO2 ° °Studies/Results: °No results found. ° °Anti-infectives: °Anti-infectives (From admission, onward)  ° ° Start     Dose/Rate Route Frequency Ordered Stop  ° 07/18/21 1437  ceFAZolin 1 g / gentamicin 80 mg in NS 500 mL surgical irrigation  Status:  Discontinued       °   As needed 07/18/21 1437 07/18/21 1457  ° 07/18/21 1430  ceFAZolin (ANCEF) IVPB 2g/100 mL premix       ° 2 g °200 mL/hr over 30 Minutes Intravenous On call to O.R. 07/17/21 2241 07/18/21 1402  ° 07/17/21 1100  ceFAZolin (ANCEF) IVPB 2g/100 mL premix       ° 2 g °200 mL/hr over 30 Minutes Intravenous Every 6 hours 07/17/21 0949 07/17/21 2319  ° 07/16/21 1530  ceFAZolin (ANCEF) IVPB 2g/100 mL premix       ° 2 g °200 mL/hr over 30 Minutes Intravenous Every 6 hours 07/16/21 1442 07/17/21 0438  ° 07/16/21 0600  ceFAZolin (ANCEF) IVPB 2g/100 mL premix       ° 2 g °200 mL/hr over 30 Minutes Intravenous On call to O.R. 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 Handy on 07/16/2021.  Plastic surgery was subsequently consulted for assistance with ongoing wound care. ° °Bilateral thigh wounds: Return to the OR this afternoon, n.p.o. currently.  Discussed surgical plan and risk with patient and his spouse.  Discussed with patient and his spouse ongoing wound care with wound VAC dressing changes at home will be necessary  after discharge.  Discussed timeline for healing bilateral thigh wounds.  ° °Pain management: Pain currently well controlled, continue with current pain regimen.  ° °Left leg DVT: Currently on Eliquis, continue Eliquis.  We will plan to tentatively hold today, will reach out to vascular surgery for recommendations on continuing through perioperative period or holding. ° °ABL anemia: Patient previously received 2 units of PRBCs on 07/17/2021, hemoglobin stable at this time at 8.7.  Continue to monitor.  ° °Nutrition: Discussed with patient high-protein, vitamins to improve wound healing. ° °Activity: Out of bed as tolerated. ° °Dispo: OR today, possible plan for discharge tomorrow or 07/24/2021 pending home health RN orders and home wound VAC availability ° ° LOS: 6 days  ° ° °Shan Padgett J Daniah Zaldivar, PA-C °07/22/2021 ° °

## 2021-07-22 NOTE — Interval H&P Note (Signed)
History and Physical Interval Note:  07/22/2021 2:55 PM  Wayne Hunter  has presented today for surgery, with the diagnosis of SOFT TISSUE INFECTION.  The various methods of treatment have been discussed with the patient and family. After consideration of risks, benefits and other options for treatment, the patient has consented to  Procedure(s): IRRIGATION AND DEBRIDEMENT WOUND (Bilateral) WOUND VAC CHANGE (Bilateral) APPLICATION OFACELL OR MYRIAD (Bilateral) as a surgical intervention.  The patient's history has been reviewed, patient examined, no change in status, stable for surgery.  I have reviewed the patient's chart and labs.  Questions were answered to the patient's satisfaction.     [name] Crawford Tamura

## 2021-07-22 NOTE — Anesthesia Postprocedure Evaluation (Signed)
Anesthesia Post Note  Patient: Wayne Hunter  Procedure(s) Performed: IRRIGATION AND DEBRIDEMENT WOUND (Bilateral: Thigh) WOUND VAC CHANGE (Bilateral: Thigh) APPLICATION OFACELL and MYRIAD (Bilateral: Thigh)     Patient location during evaluation: PACU Anesthesia Type: General Level of consciousness: awake and alert Pain management: pain level controlled Vital Signs Assessment: post-procedure vital signs reviewed and stable Respiratory status: spontaneous breathing, nonlabored ventilation, respiratory function stable and patient connected to nasal cannula oxygen Cardiovascular status: blood pressure returned to baseline and stable Postop Assessment: no apparent nausea or vomiting Anesthetic complications: no   No notable events documented.  Last Vitals:  Vitals:   07/22/21 1640 07/22/21 1704  BP: 124/74 130/67  Pulse: (!) 112 (!) 110  Resp: 17 16  Temp:  36.9 C  SpO2: 96% 97%    Last Pain:  Vitals:   07/22/21 1704  TempSrc: Oral  PainSc:                  Maelani Yarbro L Luci Bellucci

## 2021-07-22 NOTE — Op Note (Signed)
DATE OF OPERATION: 07/22/2021  LOCATION: [facility]  PREOPERATIVE DIAGNOSIS: Bilateral thigh wounds  POSTOPERATIVE DIAGNOSIS: Same  PROCEDURE:  Preparation of thigh for placement of skin substitute. Right: placement of Acell 1 gm powder to 10 x 15 cm wound Right: placement of VAC Left:  placement of Myriad 1 gm powder and 7 x 10 cm sheet to 7 x 10 cm wound Left:  placement of VAC  SURGEON: Vicky Schleich [place] Leathie Weich, DO  ASSISTANT: Roetta Sessions, PA  EBL: 20 cc  CONDITION: Stable  COMPLICATIONS: None  INDICATION: The patient, Wayne Hunter, is a 26 y.o. male born on March 23, 1996, is here for treatment of bilateral thigh wounds.   PROCEDURE DETAILS:  The patient was seen prior to surgery and marked.  The IV antibiotics were given. The patient was taken to the operating room and given a general anesthetic. A standard time out was performed and all information was confirmed by those in the room. The thighs were prepped and draped.  There was good incorporation of the product on each thigh.  The decision was made to apply more product and change the VAC.  Right:  Placement of 1 gm of Acell to the 10 x 15 cm wound.  The previous Acell was incorporating well.  The Sorbact was applied and secured with the 5-0 Vicryl.  The VAC was applied and there was an excellent seal.  Left:  Placement of 1 gm of Myriad and a 7 x 10 cm sheet of Myriad to the 7 x 10 cm wound that had good granulation tissue and no previous product.  The sheet was secured with the 5-0 Vicryl. The Sorbact was applied and secured with the 5-0  Vicryl.  The VAC was applied and there was an excellent seal.  The patient was allowed to wake up and taken to recovery room in stable condition at the end of the case. The family was notified at the end of the case.   The advanced practice practitioner (APP) assisted throughout the case.  The APP was essential in retraction and counter traction when needed to make  the case progress smoothly.  This retraction and assistance made it possible to see the tissue plans for the procedure.  The assistance was needed for blood control, tissue re-approximation and assisted with closure of the incision site.

## 2021-07-23 ENCOUNTER — Encounter (HOSPITAL_COMMUNITY): Payer: Self-pay | Admitting: Plastic Surgery

## 2021-07-23 NOTE — Progress Notes (Signed)
OT Cancellation Note  Patient Details Name: Wayne Hunter MRN: [id number] DOB: 1996-03-31   Cancelled Treatment:    Reason Eval/Treat Not Completed: Fatigue/lethargy limiting ability to participate. Pt sleeping soundly upon arrival, lunch tray also just arriving. OT will follow up next available time  Galen Manila 07/23/2021, 2:57 PM

## 2021-07-23 NOTE — Progress Notes (Signed)
Inpatient Rehab Admissions Coordinator:   I spoke with pt. And wife regarding potential CIR admit. Pt. States that he would think about it and get back to me, but prefers to go home. Wife states that she is able to care for Pt. With current deficits. I will continue to follow in case pt changes his mind, but I will not pursue CIR admission at this time.   Clemens Catholic, [place], [place] Admissions Coordinator  [phone] ([place]) [phone] (office)

## 2021-07-23 NOTE — Care Management (Addendum)
Spoke to   Workers Comp Case Production designer, theatre/television/film , Karna Christmas with [facility] [employer] phone [phone] fax [phone] , referral for home health has been since to workers comp for approved vendor. Latest update from them is they are searching for agencies to provide services.  1100 CIR assessing patient. Tracey with Workers Comp, and Teacher, English as a foreign language with [employer] updated.   Sent updated clinicals to [place] with Workers [employer].   1200 Was just updated by CIR That patient wants to go home. NCM Messaged Tracey with WC, Tracy with [employer] and [employer] PA  1530 Just received new home health orders. Emailed to [employer] with Workers Comp and called.

## 2021-07-23 NOTE — Progress Notes (Signed)
Initial Nutrition Assessment  DOCUMENTATION CODES:   Not applicable  INTERVENTION:   Continue 1 packet Juven BID, each packet provides 95 calories, 2.5 grams of protein (collagen) Continue 30 ml ProSource Plus TID, each supplement provides 100 kcals and 15 grams protein. Double protein w/ all meals  Continue Multivitamin w/ minerals, Vitamin C, and zinc daily   NUTRITION DIAGNOSIS:   Increased nutrient needs related to wound healing as evidenced by estimated needs. - Ongoing   GOAL:   Patient will meet greater than or equal to 90% of their needs - Ongoing   MONITOR:   PO intake, Supplement acceptance, Skin, I & O's  REASON FOR ASSESSMENT:   Consult Assessment of nutrition requirement/status  ASSESSMENT:   26 y.o. male presented for surgery of bilateral Norma Fredrickson lesions from an accident in November. No significant PMH. Pt admitted for debridement of bilateral thighs w/ soft tissue necrosis.   01/03 - debridement of bilateral thighs w/ wound VAC  01/05 - OR for bilateral debridement and wound VAC change  01/09 - OR for bilateral debridement and wound VAC change   Pt reports that he appetite is still good and that he is eating well. Pt reports that he is doing all the supplements still.  Per EMR, pt intake includes: 01/06: Dinner 100% 01/07: Breakfast 75%, Lunch 100% 01/08: Breakfast 75%, Lunch 75%, Dinner 75%  Discussed with pt and family that protein and calorie needs are higher to promote healing to wounds. Discussed ways to increase protein intake when at home; adding protein shakes to meals or between meals and focusing on eating our protein first at meal times.   Wound VAC canisters with little output.  Pt with no other questions or concerns at this time.   Medications reviewed and include: Vitamin C, Calcitrate, Colace, Ferrous Sulfate, MVI, Zinc Sulfate Labs reviewed.  Diet Order:   Diet Order             Diet regular Room service appropriate? Yes;  Fluid consistency: Thin  Diet effective now                   EDUCATION NEEDS:   No education needs have been identified at this time  Skin:  Skin Assessment: Reviewed RN Assessment  Last BM:  01/08  Height:   Ht Readings from Last 1 Encounters:  07/18/21 5\' 6"  (1.676 m)    Weight:   Wt Readings from Last 1 Encounters:  07/18/21 90 kg    Ideal Body Weight:  64.6 kg  BMI:  Body mass index is 32.02 kg/m.  Estimated Nutritional Needs:   Kcal:  2400-2600  Protein:  160-180 grams  Fluid:  >/= 2.4 L    Lameisha Schuenemann 09/15/21, RD, LDN Clinical Dietitian See [facility] for contact information.

## 2021-07-23 NOTE — Progress Notes (Signed)
Mobility Specialist Criteria Algorithm Info.   07/23/21 1600  Mobility  Activity Ambulated in hall  Range of Motion/Exercises Active;All extremities  Level of Assistance Standby assist, set-up cues, supervision of patient - no hands on  RLE Weight Bearing WBAT  LLE Weight Bearing WBAT  Distance Ambulated (ft) 65 ft  Mobility Ambulated with assistance in hallway  Mobility Response Tolerated well  Mobility performed by Mobility specialist  Bed Position Semi-fowlers   Patient received in bed eager to participate in mobility. Got to EOB with supervision and ambulated in hallway min guard/supervision with steady "step-to" gait. Required seated rest break x1 secondary to fatigue. Returned to room without complaint or incident. Tolerated ambulation well, was left lying supine in bed with all needs met.   07/23/2021 4:32 PM

## 2021-07-23 NOTE — Progress Notes (Addendum)
Inpatient Rehab Admissions Coordinator:   Per OT recommendation,  patient was screened for CIR candidacy by Megan Salon, MS, CCC-SLP  At this time, Pt. Appears to be a a potential candidate for CIR. I will request order for rehab consult per protocol for full assessment. Please contact me any with questions.  Megan Salon, MS, CCC-SLP Rehab Admissions Coordinator  [phone] ([place]) [phone] (office)

## 2021-07-23 NOTE — Progress Notes (Signed)
1 Day Post-Op  Subjective: Patient resting in bed this a.m., no distress.  Wife at bedside.  Patient reports feeling well, received 2 home wound vacs yesterday.  He denies any fevers, chills, shortness of breath or chest pain.  No current pain.   He reports some difficulty with ambulating, but has been ambulating up to the bathroom.  Reports he is able to put normal amount of pressure on her right leg, but left leg still having some deficits with normal function.  Objective: Vital signs in last 24 hours: Temp:  [98.3 F (36.8 C)-99 F (37.2 C)] 98.3 F (36.8 C) (01/10 0737) Pulse Rate:  [86-126] 87 (01/10 0737) Resp:  [16-20] 16 (01/10 0737) BP: (105-132)/(66-76) 105/68 (01/10 0737) SpO2:  [96 %-100 %] 98 % (01/10 0737) Last BM Date: 07/21/21  Intake/Output from previous day: 01/09 0701 - 01/10 0700 In: 770 [P.O.:120; I.V.:600] Out: 420 [Urine:400; Blood:20] Intake/Output this shift: No intake/output data recorded.  General appearance: alert, cooperative, no distress, and resting in bed Head: Normocephalic, without obvious abnormality, atraumatic Resp: Unlabored Extremities: Bilateral lower extremities without any pitting edema, palpable DP pulses noted.  Compartments are soft. Pulses: 2+ and symmetric Incision/Wound: Bilateral hip/thigh wound vacs are in place, good seal noted.  Serosanguineous drainage in canisters, approximately 40 to 50 cc each.  No erythema or cellulitic changes.  Minimal tenderness with palpation.  Sensation and function intact.  Lab Results:  CBC Latest Ref Rng & Units 07/22/2021 07/18/2021 07/17/2021  WBC 4.0 - 10.5 K/uL 8.7 9.0 -  Hemoglobin 13.0 - 17.0 g/dL 8.7(L) 8.6(L) 8.7(L)  Hematocrit 39.0 - 52.0 % 28.2(L) 27.8(L) 27.2(L)  Platelets 150 - 400 K/uL 585(H) 560(H) -    BMET No results for input(s): NA, K, CL, CO2, GLUCOSE, BUN, CREATININE, CALCIUM in the last 72 hours. PT/INR No results for input(s): LABPROT, INR in the last 72 hours. ABG No  results for input(s): PHART, HCO3 in the last 72 hours.  Invalid input(s): PCO2, PO2  Studies/Results: No results found.  Anti-infectives: Anti-infectives (From admission, onward)    Start     Dose/Rate Route Frequency Ordered Stop   07/23/21 0600  ceFAZolin (ANCEF) IVPB 2g/100 mL premix        2 g 200 mL/hr over 30 Minutes Intravenous On call to O.R. 07/22/21 1458 07/22/21 1531   07/22/21 1359  ceFAZolin (ANCEF) 2-4 GM/100ML-% IVPB       Note to Pharmacy: Roosvelt Maser N: cabinet override      07/22/21 1359 07/22/21 1531   07/18/21 1437  ceFAZolin 1 g / gentamicin 80 mg in NS 500 mL surgical irrigation  Status:  Discontinued          As needed 07/18/21 1437 07/18/21 1457   07/18/21 1430  ceFAZolin (ANCEF) IVPB 2g/100 mL premix        2 g 200 mL/hr over 30 Minutes Intravenous On call to O.R. 07/17/21 2241 07/18/21 1402   07/17/21 1100  ceFAZolin (ANCEF) IVPB 2g/100 mL premix        2 g 200 mL/hr over 30 Minutes Intravenous Every 6 hours 07/17/21 0949 07/17/21 2319   07/16/21 1530  ceFAZolin (ANCEF) IVPB 2g/100 mL premix        2 g 200 mL/hr over 30 Minutes Intravenous Every 6 hours 07/16/21 1442 07/17/21 0438   07/16/21 0600  ceFAZolin (ANCEF) IVPB 2g/100 mL premix        2 g 200 mL/hr over 30 Minutes Intravenous On call to O.R. 07/16/21  [id number] 07/16/21 0830       Assessment/Plan: s/p Procedure(s): IRRIGATION AND DEBRIDEMENT WOUND WOUND VAC CHANGE APPLICATION OFACELL and MYRIAD  26 year old male status post additional debridement of bilateral thighs with Dr. Marla Roe yesterday 07/22/2021.  He is doing well this AM.  No acute overnight events.  Bilateral thigh wounds: Doing well after debridement and placement of additional wound matrix yesterday, continue with dressing changes.  Plan for wound VAC dressing change in 1 week with home health RN.  Pain management: Pain currently well controlled, will send home with prescription for p.o. pain control.  While inpatient,  continue with current regimen.  Left leg DVT: Currently on Eliquis, continue Eliquis.  Appreciate vascular surgery seeing patient yesterday.  Continue to follow-up with vascular surgery upon discharge.  ABL anemia: Hemoglobin yesterday 8.7, no symptomatic changes.  Nutrition: Discussed with patient high-protein, vitamins to improve wound healing.  Decrease carbs and sugars.  Activity: Out of bed as tolerated.  Disposition: Possible discharge today, patient was able to receive home wound VAC supplies.  Waiting on confirmation for home health services for assistance with therapy, dressing changes.  Appreciate case management assistance.  Discussed plan with patient, discussed with patient that pending home health services may be to discharge today or possibly tomorrow.   LOS: 7 days    Charlies Constable, PA-C 07/23/2021

## 2021-07-23 NOTE — Progress Notes (Signed)
Physical Therapy Treatment Patient Details Name: Wayne Hunter MRN: [id number] DOB: [birth year] Today's Date: 07/23/2021   History of Present Illness 26 y.o. male admitted 1/3 for I&D bil thighs after having drainage from the left thigh where the eschar has started to detach from the lower dermal tissue.  Odor also noted.  He also had fixed eschar to the lateral right thigh as well.  MD also placed wound VAC to provide additional debridement while working on obtaining better granulation tissue. MD anticipates return to the OR  48 to 72 hours after 1/3 surgery for placement of biologic graft if the wound beds look healthy.  Original injury was in november when pt was run over by a Glass blower/designer.  Patient had significant bilateral Morel Lavallee lesions.  Due to their size he was taken to the OR for incision and drainage of these lesions.   His course was also complicated by a large DVT in his left leg that was diagnosed on 06/28/2021.  He was started on treatment dose Eliquis.    PT Comments    Pt very emotional (tears of joy) regarding being able to now WB on L LE. Pt able amb with RW with assist for wound vacs. Pt with no stairs to negotiate to enter home or in home. Pt mobilizing at [facility] for L LE management in which wife can provide. Acute PT to cont to follow to progress mobility, ROM and strength of bilat LEs.    Recommendations for follow up therapy are one component of a multi-disciplinary discharge planning process, led by the attending physician.  Recommendations may be updated based on patient status, additional functional criteria and insurance authorization.  Follow Up Recommendations  Home health PT     Assistance Recommended at Discharge Frequent or constant Supervision/Assistance  Patient can return home with the following A little help with bathing/dressing/bathroom;A little help with walking and/or transfers;Assist for transportation   Equipment Recommendations   BSC/3in1;Rolling walker (2 wheels)    Recommendations for Other Services       Precautions / Restrictions Precautions Precautions: Fall Precaution Comments: Bil wound VACS Restrictions Weight Bearing Restrictions: Yes RLE Weight Bearing: Weight bearing as tolerated LLE Weight Bearing: Weight bearing as tolerated (cleared by vascular PA Dagoberto Ligas)     Mobility  Bed Mobility Overal bed mobility: Needs Assistance Bed Mobility: Supine to Sit     Supine to sit: Min assist     General bed mobility comments: minA for L LE mangement, HOB elevated, able to bring self into long sit and scoot to EOB    Transfers Overall transfer level: Needs assistance Equipment used: Rolling walker (2 wheels) Transfers: Sit to/from Stand Sit to Stand: From elevated surface;Min assist;+2 safety/equipment           General transfer comment: bed elevated some, verbal cues for hand placement, minA to power up    Ambulation/Gait Ambulation/Gait assistance: Min assist;+2 safety/equipment Gait Distance (Feet): 20 Feet (x1, 30x1) Assistive device: Rolling walker (2 wheels) Gait Pattern/deviations: Step-to pattern;Step-through pattern;Decreased stride length;Decreased stance time - left Gait velocity: slow Gait velocity interpretation: <1.31 ft/sec, indicative of household ambulator   General Gait Details: pt initially step to pattern with 25% WBing thru L LE for 20', pt then rested due to "Oh man I'm tired.". Pt then amb a second bout with increased L LE WBing tolerance and more reciprocal gait pattern with shorter step length   Stairs  Wheelchair Mobility    Modified Rankin (Stroke Patients Only)       Balance Overall balance assessment: Needs assistance Sitting-balance support: Feet unsupported;Bilateral upper extremity supported Sitting balance-Leahy Scale: Good     Standing balance support: Bilateral upper extremity supported;During functional  activity;Reliant on assistive device for balance Standing balance-Leahy Scale: Poor Standing balance comment: relies on RW to keep weight off left LE.                            Cognition Arousal/Alertness: Awake/alert Behavior During Therapy: WFL for tasks assessed/performed Overall Cognitive Status: Within Functional Limits for tasks assessed                                 General Comments: very motivated to return home ASAP, pt in tears of joy when cleared to WB on L LE        Exercises      General Comments General comments (skin integrity, edema, etc.): pt with bilat wound vacs on lateral thighs      Pertinent Vitals/Pain Pain Assessment: Faces Faces Pain Scale: Hurts whole lot Pain Location: R hip s/p amb Pain Descriptors / Indicators: Grimacing;Guarding;Aching;Sharp Pain Intervention(s): Patient requesting pain meds-RN notified    Home Living                          Prior Function            PT Goals (current goals can now be found in the care plan section) Acute Rehab PT Goals Patient Stated Goal: to go home PT Goal Formulation: With patient Time For Goal Achievement: 07/31/21 Potential to Achieve Goals: Good Progress towards PT goals: Progressing toward goals    Frequency    Min 5X/week      PT Plan Current plan remains appropriate    Co-evaluation              AM-PAC PT "6 Clicks" Mobility   Outcome Measure  Help needed turning from your back to your side while in a flat bed without using bedrails?: A Little Help needed moving from lying on your back to sitting on the side of a flat bed without using bedrails?: A Little Help needed moving to and from a bed to a chair (including a wheelchair)?: A Little Help needed standing up from a chair using your arms (e.g., wheelchair or bedside chair)?: A Little Help needed to walk in hospital room?: A Little Help needed climbing 3-5 steps with a railing? : A  Lot 6 Click Score: 17    End of Session Equipment Utilized During Treatment: Gait belt Activity Tolerance: Patient tolerated treatment well Patient left: with call bell/phone within reach;with family/visitor present;in chair;with chair alarm set Nurse Communication: Mobility status;Patient requests pain meds PT Visit Diagnosis: Muscle weakness (generalized) (M62.81);Pain Pain - Right/Left:  (bil) Pain - part of body: Leg     Time: [id number] PT Time Calculation (min) (ACUTE ONLY): 40 min  Charges:  $Gait Training: 23-37 mins $Therapeutic Activity: 8-22 mins                     Kittie Plater, PT, DPT Acute Rehabilitation Services Pager #: [phone] Office #: [phone]    Berline Lopes 07/23/2021, 2:04 PM

## 2021-07-24 ENCOUNTER — Encounter (HOSPITAL_COMMUNITY): Payer: Self-pay | Admitting: Physical Medicine & Rehabilitation

## 2021-07-24 ENCOUNTER — Other Ambulatory Visit: Payer: Self-pay

## 2021-07-24 ENCOUNTER — Inpatient Hospital Stay (HOSPITAL_COMMUNITY)
Admission: RE | Admit: 2021-07-24 | Discharge: 2021-08-01 | DRG: 940 | Disposition: A | Discharge status: Home Health | Payer: PRIVATE HEALTH INSURANCE | Source: Intra-hospital | Attending: Physical Medicine & Rehabilitation | Admitting: Physical Medicine & Rehabilitation

## 2021-07-24 DIAGNOSIS — D62 Acute posthemorrhagic anemia: Secondary | ICD-10-CM | POA: Diagnosis present

## 2021-07-24 DIAGNOSIS — S7712XD Crushing injury of left thigh, subsequent encounter: Secondary | ICD-10-CM | POA: Diagnosis not present

## 2021-07-24 DIAGNOSIS — Z86718 Personal history of other venous thrombosis and embolism: Secondary | ICD-10-CM

## 2021-07-24 DIAGNOSIS — S7712XA Crushing injury of left thigh, initial encounter: Secondary | ICD-10-CM

## 2021-07-24 DIAGNOSIS — S8781XD Crushing injury of right lower leg, subsequent encounter: Secondary | ICD-10-CM | POA: Diagnosis not present

## 2021-07-24 DIAGNOSIS — Z79899 Other long term (current) drug therapy: Secondary | ICD-10-CM

## 2021-07-24 DIAGNOSIS — R509 Fever, unspecified: Secondary | ICD-10-CM | POA: Diagnosis not present

## 2021-07-24 DIAGNOSIS — S71102D Unspecified open wound, left thigh, subsequent encounter: Secondary | ICD-10-CM | POA: Diagnosis not present

## 2021-07-24 DIAGNOSIS — I82402 Acute embolism and thrombosis of unspecified deep veins of left lower extremity: Secondary | ICD-10-CM | POA: Diagnosis not present

## 2021-07-24 DIAGNOSIS — S71109A Unspecified open wound, unspecified thigh, initial encounter: Secondary | ICD-10-CM | POA: Diagnosis not present

## 2021-07-24 DIAGNOSIS — R109 Unspecified abdominal pain: Secondary | ICD-10-CM

## 2021-07-24 DIAGNOSIS — R5381 Other malaise: Secondary | ICD-10-CM | POA: Diagnosis present

## 2021-07-24 DIAGNOSIS — S7711XD Crushing injury of right thigh, subsequent encounter: Secondary | ICD-10-CM

## 2021-07-24 DIAGNOSIS — S71109S Unspecified open wound, unspecified thigh, sequela: Secondary | ICD-10-CM | POA: Diagnosis not present

## 2021-07-24 DIAGNOSIS — Z7901 Long term (current) use of anticoagulants: Secondary | ICD-10-CM | POA: Diagnosis not present

## 2021-07-24 DIAGNOSIS — S71101D Unspecified open wound, right thigh, subsequent encounter: Secondary | ICD-10-CM | POA: Diagnosis not present

## 2021-07-24 DIAGNOSIS — S71009S Unspecified open wound, unspecified hip, sequela: Secondary | ICD-10-CM | POA: Diagnosis not present

## 2021-07-24 DIAGNOSIS — S7711XA Crushing injury of right thigh, initial encounter: Secondary | ICD-10-CM

## 2021-07-24 DIAGNOSIS — S8782XD Crushing injury of left lower leg, subsequent encounter: Secondary | ICD-10-CM | POA: Diagnosis not present

## 2021-07-24 MED ORDER — CALCIUM CITRATE 950 (200 CA) MG PO TABS
200.0000 mg | ORAL_TABLET | Freq: Two times a day (BID) | ORAL | Status: DC
Start: 2021-07-24 — End: 2021-07-27
  Administered 2021-07-24 – 2021-07-27 (×6): 200 mg via ORAL
  Filled 2021-07-24 (×7): qty 1

## 2021-07-24 MED ORDER — ONDANSETRON HCL 4 MG PO TABS: 4.0000 mg | ORAL_TABLET | Freq: Four times a day (QID) | ORAL | Status: DC | PRN

## 2021-07-24 MED ORDER — APIXABAN 5 MG PO TABS
5.0000 mg | ORAL_TABLET | Freq: Two times a day (BID) | ORAL | Status: DC
Start: 2021-07-24 — End: 2021-08-01
  Administered 2021-07-24 – 2021-08-01 (×15): 5 mg via ORAL
  Filled 2021-07-24 (×15): qty 1

## 2021-07-24 MED ORDER — PROSOURCE PLUS PO LIQD
30.0000 mL | Freq: Three times a day (TID) | ORAL | Status: DC
Start: 2021-07-24 — End: 2021-07-29
  Administered 2021-07-24 – 2021-07-28 (×12): 30 mL via ORAL
  Filled 2021-07-24 (×13): qty 30

## 2021-07-24 MED ORDER — DOCUSATE SODIUM 100 MG PO CAPS
100.0000 mg | ORAL_CAPSULE | Freq: Two times a day (BID) | ORAL | Status: DC
  Administered 2021-07-24 – 2021-07-27 (×6): 100 mg via ORAL
  Filled 2021-07-24 (×6): qty 1

## 2021-07-24 MED ORDER — CYCLOBENZAPRINE HCL 5 MG PO TABS
5.0000 mg | ORAL_TABLET | Freq: Three times a day (TID) | ORAL | Status: DC | PRN
  Administered 2021-07-24 – 2021-07-31 (×5): 10 mg via ORAL
  Filled 2021-07-24 (×6): qty 2

## 2021-07-24 MED ORDER — JUVEN PO PACK
1.0000 | PACK | Freq: Two times a day (BID) | ORAL | Status: DC
  Administered 2021-07-25 – 2021-08-01 (×14): 1 via ORAL
  Filled 2021-07-24 (×13): qty 1

## 2021-07-24 MED ORDER — ACETAMINOPHEN 325 MG PO TABS
325.0000 mg | ORAL_TABLET | Freq: Four times a day (QID) | ORAL | Status: DC | PRN
  Administered 2021-07-25 – 2021-07-30 (×4): 650 mg via ORAL
  Filled 2021-07-24 (×4): qty 2

## 2021-07-24 MED ORDER — FERROUS SULFATE 325 (65 FE) MG PO TABS
325.0000 mg | ORAL_TABLET | Freq: Three times a day (TID) | ORAL | Status: DC
  Administered 2021-07-24 – 2021-08-01 (×21): 325 mg via ORAL
  Filled 2021-07-24 (×21): qty 1

## 2021-07-24 MED ORDER — ZINC SULFATE 220 (50 ZN) MG PO CAPS
220.0000 mg | ORAL_CAPSULE | Freq: Every day | ORAL | Status: AC
  Administered 2021-07-25 – 2021-07-30 (×5): 220 mg via ORAL
  Filled 2021-07-24 (×5): qty 1

## 2021-07-24 MED ORDER — ONDANSETRON HCL 4 MG/2ML IJ SOLN
4.0000 mg | Freq: Four times a day (QID) | INTRAMUSCULAR | Status: DC | PRN
  Administered 2021-07-27: 09:00:00 4 mg via INTRAVENOUS
  Filled 2021-07-24: qty 2

## 2021-07-24 MED ORDER — HYDROCODONE-ACETAMINOPHEN 5-325 MG PO TABS
1.0000 | ORAL_TABLET | ORAL | Status: DC | PRN
  Administered 2021-07-24 – 2021-07-25 (×4): 2 via ORAL
  Administered 2021-07-25: 13:00:00 1 via ORAL
  Administered 2021-07-26 (×4): 2 via ORAL
  Administered 2021-07-27: 12:00:00 1 via ORAL
  Administered 2021-07-27 – 2021-07-30 (×5): 2 via ORAL
  Filled 2021-07-24 (×4): qty 2
  Filled 2021-07-24: qty 1
  Filled 2021-07-24 (×11): qty 2

## 2021-07-24 MED ORDER — MAGNESIUM GLUCONATE 500 MG PO TABS
250.0000 mg | ORAL_TABLET | Freq: Every day | ORAL | Status: DC
  Administered 2021-07-24 – 2021-07-31 (×8): 250 mg via ORAL
  Filled 2021-07-24 (×8): qty 1

## 2021-07-24 MED ORDER — ASCORBIC ACID 500 MG PO TABS
1000.0000 mg | ORAL_TABLET | Freq: Every day | ORAL | Status: DC
  Administered 2021-07-25 – 2021-08-01 (×7): 1000 mg via ORAL
  Filled 2021-07-24 (×7): qty 2

## 2021-07-24 MED ORDER — ADULT MULTIVITAMIN W/MINERALS CH
1.0000 | ORAL_TABLET | Freq: Every day | ORAL | Status: DC
Start: 2021-07-25 — End: 2021-08-01
  Administered 2021-07-25 – 2021-08-01 (×7): 1 via ORAL
  Filled 2021-07-24 (×7): qty 1

## 2021-07-24 MED ORDER — PROPRANOLOL HCL 10 MG PO TABS
10.0000 mg | ORAL_TABLET | Freq: Every day | ORAL | Status: DC
  Administered 2021-07-24 – 2021-07-25 (×2): 10 mg via ORAL
  Filled 2021-07-24 (×3): qty 1

## 2021-07-24 NOTE — Progress Notes (Signed)
Inpatient Rehab Admissions Coordinator:   I spoke again with Pt. And wife and they confirm again that they wish for Pt.to go home with [facility] and decline CIR. I will sign off.   Megan Salon, MS, CCC-SLP Rehab Admissions Coordinator  [phone] (celll) [phone] (office)

## 2021-07-24 NOTE — Progress Notes (Signed)
Physical Therapy Treatment Patient Details Name: Wayne Hunter MRN: [id number] DOB: 04/04/1996 Today's Date: 07/24/2021   History of Present Illness 26 y.o. male admitted 1/3 for I&D bil thighs after having drainage from the left thigh where the eschar has started to detach from the lower dermal tissue.  Odor also noted.  He also had fixed eschar to the lateral right thigh as well.  MD also placed wound VAC to provide additional debridement while working on obtaining better granulation tissue. MD anticipates return to the OR  48 to 72 hours after 1/3 surgery for placement of biologic graft if the wound beds look healthy.  Original injury was in november when pt was run over by a Restaurant manager, fast food.  Patient had significant bilateral Morel Lavallee lesions.  Due to their size he was taken to the OR for incision and drainage of these lesions.   His course was also complicated by a large DVT in his left leg that was diagnosed on 06/28/2021.  He was started on treatment dose Eliquis.    PT Comments    Pt admitted with above diagnosis. Pt was able to get OOB and ambulate with RW with min guard assist.  Pt with pain but is able to work through the pain.  Pt progressing well and wife present to assist with session.  Pt currently with functional limitations due to balance and endurance deficits. Pt will benefit from skilled PT to increase their independence and safety with mobility to allow discharge to the venue listed below.      Recommendations for follow up therapy are one component of a multi-disciplinary discharge planning process, led by the attending physician.  Recommendations may be updated based on patient status, additional functional criteria and insurance authorization.  Follow Up Recommendations  Home health PT     Assistance Recommended at Discharge Frequent or constant Supervision/Assistance  Patient can return home with the following A little help with bathing/dressing/bathroom;A  little help with walking and/or transfers;Assist for transportation   Equipment Recommendations  BSC/3in1;Rolling walker (2 wheels)    Recommendations for Other Services       Precautions / Restrictions Precautions Precautions: Fall Precaution Comments: Bil wound VACS Restrictions Weight Bearing Restrictions: Yes RLE Weight Bearing: Weight bearing as tolerated LLE Weight Bearing: Weight bearing as tolerated     Mobility  Bed Mobility Overal bed mobility: Needs Assistance Bed Mobility: Supine to Sit Rolling: Supervision   Supine to sit: Supervision Sit to supine: Min guard   General bed mobility comments: Pt did best without assist coming to EOB.  Takes incr time however pt comes to long sit without assist.  pt needed min guard A for L LE management back to bed only    Transfers Overall transfer level: Needs assistance Equipment used: Rolling walker (2 wheels) Transfers: Sit to/from Stand Sit to Stand: From elevated surface;+2 safety/equipment;Min guard           General transfer comment: bed elevated some, verbal cues for hand placement, minguard A to power up    Ambulation/Gait Ambulation/Gait assistance: +2 safety/equipment;Min guard;Supervision Gait Distance (Feet): 70 Feet Assistive device: Rolling walker (2 wheels) Gait Pattern/deviations: Step-to pattern;Step-through pattern;Decreased stride length;Decreased stance time - left;Decreased weight shift to left;Antalgic Gait velocity: slow     General Gait Details: pt initially step to pattern with 25% WBing thru L LE for 20', Pt then amb a second bout with increased L LE WBing tolerance and more reciprocal gait pattern with shorter step length.  Incr distance   Social research officer, government Rankin (Stroke Patients Only)       Balance Overall balance assessment: Needs assistance Sitting-balance support: Feet unsupported;Bilateral upper extremity supported Sitting  balance-Leahy Scale: Good Sitting balance - Comments: Once to EOB, could sit with UE support of at least 1 UE.  min guard assist overall  due to guarding and pain   Standing balance support: Bilateral upper extremity supported;During functional activity;Reliant on assistive device for balance Standing balance-Leahy Scale: Poor Standing balance comment: relies on RW to keep weight off left LE.                            Cognition Arousal/Alertness: Awake/alert Behavior During Therapy: WFL for tasks assessed/performed Overall Cognitive Status: Within Functional Limits for tasks assessed                                 General Comments: very motivated to return home ASAP        Exercises      General Comments        Pertinent Vitals/Pain Pain Assessment: Faces Faces Pain Scale: Hurts whole lot Pain Location: R hip and left calf Pain Descriptors / Indicators: Grimacing;Guarding;Aching;Sharp Pain Intervention(s): Limited activity within patient's tolerance;Monitored during session;Repositioned;Patient requesting pain meds-RN notified;RN gave pain meds during session    Home Living                          Prior Function            PT Goals (current goals can now be found in the care plan section) Acute Rehab PT Goals Patient Stated Goal: to go home Progress towards PT goals: Progressing toward goals    Frequency    Min 5X/week      PT Plan Current plan remains appropriate    Co-evaluation              AM-PAC PT "6 Clicks" Mobility   Outcome Measure  Help needed turning from your back to your side while in a flat bed without using bedrails?: A Little Help needed moving from lying on your back to sitting on the side of a flat bed without using bedrails?: A Little Help needed moving to and from a bed to a chair (including a wheelchair)?: A Little Help needed standing up from a chair using your arms (e.g., wheelchair or  bedside chair)?: A Little Help needed to walk in hospital room?: A Little Help needed climbing 3-5 steps with a railing? : A Lot 6 Click Score: 17    End of Session Equipment Utilized During Treatment: Gait belt Activity Tolerance: Patient tolerated treatment well Patient left: with call bell/phone within reach;with family/visitor present;in bed Nurse Communication: Mobility status PT Visit Diagnosis: Muscle weakness (generalized) (M62.81);Pain Pain - Right/Left:  (bil) Pain - part of body: Leg     Time: [id number] PT Time Calculation (min) (ACUTE ONLY): 34 min  Charges:  $Gait Training: 23-37 mins                     Wayne Hunter M,PT Acute Rehab Services [phone] (pager)    Wayne Hunter 07/24/2021, 12:56 PM

## 2021-07-24 NOTE — Discharge Summary (Signed)
Physician Discharge Summary  Patient ID: Wayne Hunter MRN: [id number] DOB/AGE: 11-21-1995 26 y.o.  Admit date: 07/16/2021 Discharge date: 07/24/2021  Admission Diagnoses:  Discharge Diagnoses:  Principal Problem:   Soft tissue infection   Discharged Condition: good  Hospital Course: The patient is a 26 year old man who was involved in a crush injury at work approximately a month ago on May 31, 2021.  He had injuries to bilateral thighs and the lateral aspect.  For various reasons the patient delayed surgery.  He underwent debridement of both thighs on January 3 by Dr. Carola Frost and January 5 and January 10 by plastic surgery.  His wound are extensive and the size of each is noted in the op reports.  He had skin substitute placed to help with encouraging his body to lay down granulation tissue and fill in the defects.  He does not appear to be infected.  He has done very well over the last couple of days.  He did have a DVT for which she is on blood thinners.  This appears to be a result from the accident.  He is being treated with the KCI negative pressure at 125 mm continuous pressure.  The plan will be to go back to the OR next week for a VAC change.  He will likely need an OR visit the following week for another VAC change.  I am hopeful that he will be able to transition to [facility] changes while awake.  He will need to increase his protein and decrease his carbohydrates in his sugars.  As he heals I think he will be ready for grafting in the next 1 to 2 months.  The patient was discharged to rehab.  Consults: orthopedic surgery and Plastic Surgery  Significant Diagnostic Studies: The patient had CT and MRI done and those are noted under imaging in epic.  Treatments: surgery  Discharge Exam: Blood pressure 124/69, pulse (!) 103, temperature 98.4 F (36.9 C), temperature source Oral, resp. rate 16, height 5\' 6"  (1.676 m), weight 90 kg, SpO2 99 %. General appearance: alert, cooperative,  and no distress Incision/Wound:  Disposition: Discharge disposition: [facility]-Another [facility] Care Institution Not Defined       Discharge Instructions     Call MD for:  difficulty breathing, headache or visual disturbances   Complete by: As directed    Call MD for:  extreme fatigue   Complete by: As directed    Call MD for:  hives   Complete by: As directed    Call MD for:  persistant dizziness or light-headedness   Complete by: As directed    Call MD for:  persistant nausea and vomiting   Complete by: As directed    Call MD for:  redness, tenderness, or signs of infection (pain, swelling, redness, odor or green/yellow discharge around incision site)   Complete by: As directed    Call MD for:  severe uncontrolled pain   Complete by: As directed    Call MD for:  temperature >100.4   Complete by: As directed    Diet - low sodium heart healthy   Complete by: As directed    Diet general   Complete by: As directed    Discharge wound care:   Complete by: As directed    KCI van at 125 mmHg pressure with once a week VAC change continuous pressure Bilateral thighs   Increase activity slowly   Complete by: As directed    Increase activity slowly   Complete  by: As directed       Allergies as of 07/24/2021       Reactions   Bee Venom Swelling        Medication List     TAKE these medications    amoxicillin-clavulanate 875-125 MG tablet Commonly known as: AUGMENTIN Take 1 tablet by mouth every 12 (twelve) hours.   calcium citrate 950 (200 Ca) MG tablet Commonly known as: CALCITRATE - dosed in mg elemental calcium Take 1 tablet (200 mg of elemental calcium total) by mouth 2 (two) times daily.   cyclobenzaprine 5 MG tablet Commonly known as: FLEXERIL Take 1 tablet by mouth every 8 hours as needed for spasms.   Eliquis 5 MG Tabs tablet Generic drug: apixaban Take 1 tablet (5 mg total) by mouth every 12 (twelve) hours.   ibuprofen 200 MG tablet Commonly known as:  ADVIL Take 400 mg by mouth every 8 (eight) hours as needed (BACK PAIN.).   Natural Vitamin D-3 125 MCG (5000 UT) Tabs Generic drug: Cholecalciferol Take 1 tablet by mouth daily.   oxyCODONE-acetaminophen 5-325 MG tablet Commonly known as: PERCOCET/ROXICET Take 1-2 tablets by mouth every 12 (twelve) hours as needed for severe pain   vitamin C 1000 MG tablet Take 1 tablet (1,000 mg total) by mouth daily.               Durable Medical Equipment  (From admission, onward)           Start     Ordered   07/22/21 1012  For home use only DME 3 n 1  Once        07/22/21 1011   07/16/21 1443  For home use only DME Negative pressure wound device  Once       Question Answer Comment  Frequency of dressing change 3 times per week   Length of need 3 Months   Dressing type Foam   Amount of suction 125 mm/Hg   Pressure application Continuous pressure   Supplies 10 canisters and 15 dressings per month for duration of therapy      07/16/21 1442              Discharge Care Instructions  (From admission, onward)           Start     Ordered   07/24/21 0000  Discharge wound care:       Comments: Sena Slate at 125 mmHg pressure with once a week VAC change continuous pressure Bilateral thighs   07/24/21 1827            Follow-up Information     Bay Wayson, Alena Bills, DO Follow up in 2 week(s).   Specialty: Plastic Surgery Contact information: [address] [place] [address] [phone]                 Signed: Peggye Form 07/24/2021, 6:28 PM

## 2021-07-24 NOTE — Progress Notes (Signed)
Inpatient Rehab Admissions Coordinator:   Pt. Now stating that he wants to come to CIR. I have reached out to worker's comp and await approval. If they approve, I can offer a bed today. I will follow up once I receive a response.   Megan Salon, MS, CCC-SLP Rehab Admissions Coordinator  [phone] (celll) [phone] (office)

## 2021-07-24 NOTE — Progress Notes (Signed)
2 Days Post-Op  Subjective: Patient resting in bed, wife and bedside recliner.  Patient reports feeling well, reports ambulating in the hall yesterday, feels as if this went well.  A little bit of decreased strength in the left leg but otherwise no issues.  He is aware that inpatient rehab is an option, he has denied this.  He reports he would like to go home as soon as home health is available.  No acute overnight events.  Objective: Vital signs in last 24 hours: Temp:  [98.1 F (36.7 C)-98.7 F (37.1 C)] 98.7 F (37.1 C) (01/11 0730) Pulse Rate:  [89-100] 90 (01/11 0730) Resp:  [16-18] 16 (01/11 0730) BP: (97-116)/(51-65) 97/54 (01/11 0730) SpO2:  [98 %-100 %] 99 % (01/11 0730) Last BM Date: 07/21/21  Intake/Output from previous day: 01/10 0701 - 01/11 0700 In: 600 [P.O.:600] Out: 1100 [Urine:1000; Drains:100] Intake/Output this shift: No intake/output data recorded.  General appearance: alert, cooperative, no distress, and resting in bed Head: Normocephalic, without obvious abnormality, atraumatic Resp: Unlabored Extremities: extremities normal, atraumatic, no cyanosis or edema and palpable pulses noted Pulses: 2+ and symmetric Incision/Wound: Bilateral wound vacs in place, approximately 30-50 cc of serosanguineous drainage in each wound VAC canister.  Good seal noted.  No surrounding erythema or cellulitic changes.  Minimal tenderness to palpation.  Lab Results:  CBC Latest Ref Rng & Units 07/22/2021 07/18/2021 07/17/2021  WBC 4.0 - 10.5 K/uL 8.7 9.0 -  Hemoglobin 13.0 - 17.0 g/dL 8.7(L) 8.6(L) 8.7(L)  Hematocrit 39.0 - 52.0 % 28.2(L) 27.8(L) 27.2(L)  Platelets 150 - 400 K/uL 585(H) 560(H) -    BMET No results for input(s): NA, K, CL, CO2, GLUCOSE, BUN, CREATININE, CALCIUM in the last 72 hours. PT/INR No results for input(s): LABPROT, INR in the last 72 hours. ABG No results for input(s): PHART, HCO3 in the last 72 hours.  Invalid input(s): PCO2,  PO2  Studies/Results: No results found.  Anti-infectives: Anti-infectives (From admission, onward)    Start     Dose/Rate Route Frequency Ordered Stop   07/23/21 0600  ceFAZolin (ANCEF) IVPB 2g/100 mL premix        2 g 200 mL/hr over 30 Minutes Intravenous On call to O.R. 07/22/21 1458 07/22/21 1531   07/22/21 1359  ceFAZolin (ANCEF) 2-4 GM/100ML-% IVPB       Note to Pharmacy: Roosvelt Maser N: cabinet override      07/22/21 1359 07/22/21 1531   07/18/21 1437  ceFAZolin 1 g / gentamicin 80 mg in NS 500 mL surgical irrigation  Status:  Discontinued          As needed 07/18/21 1437 07/18/21 1457   07/18/21 1430  ceFAZolin (ANCEF) IVPB 2g/100 mL premix        2 g 200 mL/hr over 30 Minutes Intravenous On call to O.R. 07/17/21 2241 07/18/21 1402   07/17/21 1100  ceFAZolin (ANCEF) IVPB 2g/100 mL premix        2 g 200 mL/hr over 30 Minutes Intravenous Every 6 hours 07/17/21 0949 07/17/21 2319   07/16/21 1530  ceFAZolin (ANCEF) IVPB 2g/100 mL premix        2 g 200 mL/hr over 30 Minutes Intravenous Every 6 hours 07/16/21 1442 07/17/21 0438   07/16/21 0600  ceFAZolin (ANCEF) IVPB 2g/100 mL premix        2 g 200 mL/hr over 30 Minutes Intravenous On call to O.R. 07/16/21 0558 07/16/21 0830       Assessment/Plan: s/p Procedure(s): IRRIGATION AND DEBRIDEMENT  WOUND WOUND VAC CHANGE APPLICATION OFACELL and MYRIAD  26 year old male status post additional debridement of bilateral thighs with Dr. Marla Roe yesterday 07/22/2021.  He is doing well today, resting in bed upon evaluation.  No complaints.  He wants to go home as soon as possible.  He does not want to do inpatient rehab.   Bilateral thigh wounds: Doing well after debridement, plan for initial wound VAC change on 07/29/2021 with home health RN  Pain management: Pain currently well controlled on p.o. pain medication per patient.   Left leg DVT: Currently on Eliquis, continue Eliquis.  Follow-up with vascular upon discharge.   ABL  anemia: Hemoglobin 8.7 on 07/22/2021, no symptomatic changes.  Appears stable.    Nutrition: Discussed with patient high-protein, vitamins to improve wound healing.  Decrease carbs and sugars.   Activity: Out of bed as tolerated.   Disposition: Home wound VAC supplies in patient's room.  Waiting on confirmation for home health services for assistance with PT, OT, home health RN.  Appreciate case management assistance with coordination.  Patient was recommended for CIR, however patient denied this and wants to go home.     LOS: 8 days    Charlies Constable, PA-C 07/24/2021

## 2021-07-24 NOTE — Progress Notes (Signed)
Inpatient Rehabilitation  Patient information reviewed and entered into eRehab system by Vendetta Pittinger M. Diala Waxman, M.A., CCC/SLP, PPS Coordinator.  Information including medical coding, functional ability and quality indicators will be reviewed and updated through discharge.    

## 2021-07-24 NOTE — Care Management (Addendum)
Spoke to   Workers Comp Case Production designer, theatre/television/film , Karna Christmas with [facility] [employer] phone [phone] fax [phone] ,per vendor scheduling home health services [facility] will provide [facility] for [facility] dressing changes starting 07/29/21 then weekly, and [facility] will provide HHPT/OT starting tomorrow. NCM asked for contact numbers to place on AVS, awaiting response.   3 in1 will be delivered to home address today.   Patient and wife aware.   Wife concerned with how lt VAC dressing looks. NCM messaged [place] PA, he will come look at dressing, however he is in surgery and it will be a few hours . Patient, wife and nurse aware.    1350 Received a message from Keenan Bachelor PA that patient has decided to stay and reconsider CIR and pain control. Messaged CIR and nurse   Will update Workers Comp and [employer] Patient has been approved for [facility] and CIR can admit today . NCM messaged French Ana with [employer] regarding home VACs  Per CIR anticipated CIR stay 5 days. Blossom Hoops with [employer] aware.
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
## 2021-07-24 NOTE — Progress Notes (Signed)
Inpatient Rehab Admissions Coordinator:   I received approval from pt.'s Worker's Comp and have a bed for him on CIR today. I will admit him. RN may call report to [phone]  Megan Salon, MS, CCC-SLP Rehab Admissions Coordinator  [phone] (celll) [phone] (office)
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
## 2021-07-24 NOTE — PMR Pre-admission (Signed)
PMR Admission Coordinator Pre-Admission Assessment  Patient: Wayne Hunter is an 26 y.o., male MRN: [id number] DOB: 12-05-1995 Height: 5\' 6"  (167.6 cm) Weight: 90 kg  Insurance Information HMO:     PPO:      PCP:      IPA:      80/20:      OTHER:  PRIMARY: Worker's Comp      Policy#: [id number]      Subscriber:  CM Name: Garnette Czech at [facility] Case management Phone  [phone] fax [phone]       Pre-Cert#: [id number]      Employer:  Benefits:  Phone #:      Name:  Eff. Date:      Deduct:       Out of Pocket Max:       Life Max:  CIR:       SNF:  Outpatient:      Co-Pay:  Home Health:       Co-Pay:  DME:      Co-Pay:  Providers:  SECONDARY: [place] Prepaid Medicaid [employer]       Policy#: [id number]     Phone#:   Financial Counselor:       Phone#:   The Data Collection Information Summary for patients in Inpatient Rehabilitation Facilities with attached Privacy Act [place] Records was provided and verbally reviewed with: Patient  Emergency Contact Information Contact Information     Name Relation Home Work Lang[place]   [phone]       Current Medical History  Patient Admitting Diagnosis: 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 male  admitted to [facility] orthopedic surgery service 10/02/2. He was admitted for being run over by a lawn equipment tractor at the end of November.  Patient had significant bilateral Morel Lavallee lesions.  Due to their size he was taken to the OR for incision and drainage of these lesions.     His course has also been complicated by a large DVT in his left leg that was diagnosed on 06/28/2021.  He was started on treatment dose Eliquis.  He has been on Eliquis for about 3 weeks now.  He was seen in the his surgeon's office one week PTA and MD noted drainage from the left thigh where the eschar has started to detach from the lower  dermal tissue.  Due to this, pt. Was returned to the OR for debridement of the bilateral thigh eschars 07/16/21 with placement of vera flo wound VAC to provide additional debridement while we work on obtaining better granulation tissue. He returned to OR for second I&D 07/22/21. CIR was consulted to assist return to PLOF.     Patient's medical record from [facility] has been reviewed by the rehabilitation admission coordinator and physician.  Past Medical History  Past Medical History:  Diagnosis Date   Crush injury, thigh, right, initial encounter 06/04/2021   DVT (deep venous thrombosis) ([place])    left leg   Rhabdomyolysis 06/04/2021    Has the patient had major surgery during 100 days prior to admission? Yes  Family History   family history is not on file.  Current Medications  Current Facility-Administered Medications:    (feeding supplement) PROSource Plus liquid 30 mL, 30 mL, Oral, TID BM, Dillingham, Claire S, DO, 30 mL at 07/24/21 1357   0.9 %  sodium  chloride infusion (Manually program via Guardrails IV Fluids), , Intravenous, Once, Dillingham, Claire S, DO   0.9 % NaCl with KCl 20 mEq/ L  infusion, , Intravenous, Continuous, Dillingham, [name], DO, Last Rate: 50 mL/hr at 07/17/21 2003, New Bag at 07/17/21 2003   acetaminophen (TYLENOL) tablet 325-650 mg, 325-650 mg, Oral, Q6H PRN, Dillingham, [name], DO, 650 mg at 07/24/21 0212   apixaban (ELIQUIS) tablet 5 mg, 5 mg, Oral, Q12H, Dillingham, Claire S, DO, 5 mg at 07/24/21 1059   ascorbic acid (VITAMIN C) tablet 1,000 mg, 1,000 mg, Oral, Daily, Dillingham, Claire S, DO, 1,000 mg at 07/24/21 1059   calcium citrate (CALCITRATE - dosed in mg elemental calcium) tablet 200 mg of elemental calcium, 200 mg of elemental calcium, Oral, BID, Dillingham, Claire S, DO, 200 mg of elemental calcium at 07/24/21 1059   cyclobenzaprine (FLEXERIL) tablet 5-10 mg, 5-10 mg, Oral, TID PRN, Dillingham, Claire S, DO, 10 mg at 07/24/21  1139   docusate sodium (COLACE) capsule 100 mg, 100 mg, Oral, BID, Dillingham, Claire S, DO, 100 mg at 07/24/21 1059   ferrous sulfate tablet 325 mg, 325 mg, Oral, TID PC, Dillingham, Claire S, DO, 325 mg at 07/24/21 1357   HYDROcodone-acetaminophen (NORCO) 7.5-325 MG per tablet 1-2 tablet, 1-2 tablet, Oral, Q4H PRN, Dillingham, [name], DO, 2 tablet at 07/24/21 1058   HYDROcodone-acetaminophen (NORCO/VICODIN) 5-325 MG per tablet 1-2 tablet, 1-2 tablet, Oral, Q4H PRN, Dillingham, [name], DO, 1 tablet at 07/24/21 5038   lactated ringers infusion, , Intravenous, Continuous, Dillingham, [name], DO, Last Rate: 10 mL/hr at 07/16/21 0717, Continued from Pre-op at 07/18/21 1317   metoCLOPramide (REGLAN) tablet 5-10 mg, 5-10 mg, Oral, Q8H PRN **OR** metoCLOPramide (REGLAN) injection 5-10 mg, 5-10 mg, Intravenous, Q8H PRN, Dillingham, Claire S, DO   morphine 2 MG/ML injection 0.5-1 mg, 0.5-1 mg, Intravenous, Q2H PRN, Dillingham, Claire S, DO, 1 mg at 07/24/21 1321   multivitamin with minerals tablet 1 tablet, 1 tablet, Oral, Daily, Dillingham, [name], DO, 1 tablet at 07/24/21 1059   nutrition supplement (JUVEN) (JUVEN) powder packet 1 packet, 1 packet, Oral, BID BM, Dillingham, [name], DO, 1 packet at 07/24/21 1357   ondansetron (ZOFRAN) tablet 4 mg, 4 mg, Oral, Q6H PRN **OR** ondansetron (ZOFRAN) injection 4 mg, 4 mg, Intravenous, Q6H PRN, Dillingham, Claire S, DO   zinc sulfate capsule 220 mg, 220 mg, Oral, Daily, Dillingham, Claire S, DO, 220 mg at 07/24/21 1059  Patients Current Diet:  Diet Order             Diet regular Room service appropriate? Yes; Fluid consistency: Thin  Diet effective now                   Precautions / Restrictions Precautions Precautions: Fall Precaution Comments: Bil wound VACS Restrictions Weight Bearing Restrictions: Yes RLE Weight Bearing: Weight bearing as tolerated LLE Weight Bearing: Weight bearing as tolerated   Has the patient had 2 or more  falls or a fall with injury in the past year? No  Prior Activity Level Community (5-7x/wk): Working, active in the community PTA  Prior Functional Level Self Care: Did the patient need help bathing, dressing, using the toilet or eating? Independent  Indoor Mobility: Did the patient need assistance with walking from room to room (with or without device)? Independent  Stairs: Did the patient need assistance with internal or external stairs (with or without device)? Independent  Functional Cognition: Did the patient need help planning  regular tasks such as shopping or remembering to take medications? Independent  Patient Information Are you of Hispanic, Latino/a,or Spanish origin?: E. Yes, another Hispanic, Latino, or Spanish origin What is your race?: Z. None of the above Do you need or want an interpreter to communicate with a doctor or health care staff?: 0. No  Patient's Response To:  Health Literacy and Transportation Is the patient able to respond to health literacy and transportation needs?: Yes Health Literacy - How often do you need to have someone help you when you read instructions, pamphlets, or other written material from your doctor or pharmacy?: Sometimes In the past 12 months, has lack of transportation kept you from medical appointments or from getting medications?: Yes In the past 12 months, has lack of transportation kept you from meetings, work, or from getting things needed for daily living?: Yes  Home Assistive Devices / Equipment Home Equipment: Conservation officer, nature (2 wheels), Crutches, Wheelchair - manual, Shower seat  Prior Device Use: Indicate devices/aids used by the patient prior to current illness, exacerbation or injury? Walker  Current Functional Level Cognition  Overall Cognitive Status: Within Functional Limits for tasks assessed Orientation Level: Oriented X4 General Comments: very motivated to return home ASAP    Extremity Assessment (includes  Sensation/Coordination)  Upper Extremity Assessment: Overall WFL for tasks assessed  Lower Extremity Assessment: Defer to PT evaluation RLE: Unable to fully assess due to pain LLE: Unable to fully assess due to pain    ADLs  Overall ADL's : Needs assistance/impaired Eating/Feeding: Independent Grooming: Wash/dry hands, Wash/dry face, Oral care, Brushing hair, Set up, Bed level Upper Body Bathing: Minimal assistance, Bed level Lower Body Bathing: Total assistance, Bed level Upper Body Dressing : Moderate assistance, Sitting, Bed level Lower Body Dressing: Total assistance, Bed level Toilet Transfer: Moderate assistance, +2 for physical assistance, Stand-pivot, Rolling walker (2 wheels) Toilet Transfer Details (indicate cue type and reason): pt unable Toileting- Clothing Manipulation and Hygiene: Total assistance, Bed level Functional mobility during ADLs: Moderate assistance, +2 for physical assistance, Rolling walker (2 wheels) General ADL Comments: Stand pivot to recliner with Mod A +2    Mobility  Overal bed mobility: Needs Assistance Bed Mobility: Supine to Sit Rolling: Supervision Supine to sit: Supervision Sit to supine: Min guard General bed mobility comments: Pt did best without assist coming to EOB.  Takes incr time however pt comes to long sit without assist.  pt needed min guard A for L LE management back to bed only    Transfers  Overall transfer level: Needs assistance Equipment used: Rolling walker (2 wheels) Transfers: Sit to/from Stand Sit to Stand: From elevated surface, +2 safety/equipment, Min guard Bed to/from chair/wheelchair/BSC transfer type:: Step pivot Step pivot transfers: Mod assist, Min assist, +2 physical assistance General transfer comment: bed elevated some, verbal cues for hand placement, minguard A to power up    Ambulation / Gait / Stairs / Wheelchair Mobility  Ambulation/Gait Ambulation/Gait assistance: +2 safety/equipment, Min guard,  Supervision Gait Distance (Feet): 70 Feet Assistive device: Rolling walker (2 wheels) Gait Pattern/deviations: Step-to pattern, Step-through pattern, Decreased stride length, Decreased stance time - left, Decreased weight shift to left, Antalgic General Gait Details: pt initially step to pattern with 25% WBing thru L LE for 20', Pt then amb a second bout with increased L LE WBing tolerance and more reciprocal gait pattern with shorter step length.  Incr distance Gait velocity: slow Gait velocity interpretation: <1.31 ft/sec, indicative of household ambulator    Posture /  Balance Dynamic Sitting Balance Sitting balance - Comments: Once to EOB, could sit with UE support of at least 1 UE.  min guard assist overall  due to guarding and pain Balance Overall balance assessment: Needs assistance Sitting-balance support: Feet unsupported, Bilateral upper extremity supported Sitting balance-Leahy Scale: Good Sitting balance - Comments: Once to EOB, could sit with UE support of at least 1 UE.  min guard assist overall  due to guarding and pain Standing balance support: Bilateral upper extremity supported, During functional activity, Reliant on assistive device for balance Standing balance-Leahy Scale: Poor Standing balance comment: relies on RW to keep weight off left LE.    Special needs/care consideration Skin surgical incisions, bilateral thigh wounds.  Bilateral wound vacs   Previous Home Environment (from acute therapy documentation) Living Arrangements: Spouse/significant other  Lives With: Spouse Available Help at Discharge: Family Type of Home: Apartment Home Layout: One level Home Access: Level entry Bathroom Shower/Tub: Chiropodist: Handicapped height Bathroom Accessibility: Yes Home Care Services: No Additional Comments: Pt has 1 y.o. son  Discharge Living Setting Plans for Discharge Living Setting: Patient's home Type of Home at Discharge:  Apartment Discharge Home Layout: One level Discharge Home Access: Level entry Discharge Bathroom Shower/Tub: Tub/shower unit Discharge Bathroom Toilet: Handicapped height Discharge Bathroom Accessibility: Yes How Accessible: Accessible via wheelchair Does the patient have any problems obtaining your medications?: No  Social/Family/Support Systems Patient Roles: Spouse Contact Information: [phone] Anticipated Caregiver: Otto Herb Anticipated Caregiver's Contact Information: [phone] Ability/Limitations of Caregiver: Can provide Min A Caregiver Availability: 24/7 Discharge Plan Discussed with Primary Caregiver: Yes Is Caregiver In Agreement with Plan?: Yes Does Caregiver/Family have Issues with Lodging/Transportation while Pt is in Rehab?: Yes  Goals Patient/Family Goal for Rehab: PT/OT Mod I Expected length of stay: 3-5 days Pt/Family Agrees to Admission and willing to participate: Yes Program Orientation Provided & Reviewed with Pt/Caregiver Including Roles  & Responsibilities: Yes  Decrease burden of Care through IP rehab admission: Specialzed equipment needs, Decrease number of caregivers, Bowel and bladder program, and Patient/family education  Possible need for SNF placement upon discharge: not anticipated  Patient Condition: I have reviewed medical records from [place], spoken with CM, and patient and spouse. I met with patient at the bedside for inpatient rehabilitation assessment.  Patient will benefit from ongoing PT and OT, can actively participate in 3 hours of therapy a day 5 days of the week, and can make measurable gains during the admission.  Patient will also benefit from the coordinated team approach during an Inpatient Acute Rehabilitation admission.  The patient will receive intensive therapy as well as Rehabilitation physician, nursing, social worker, and care management interventions.  Due to safety, skin/wound care, disease management, medication  administration, pain management, and patient education the patient requires 24 hour a day rehabilitation nursing.  The patient is currently min A with mobility and basic ADLs.  Discharge setting and therapy post discharge at home with home health is anticipated.  Patient has agreed to participate in the Acute Inpatient Rehabilitation Program and will admit today.  Preadmission Screen Completed By:  Genella Mech, 07/24/2021 2:26 PM ______________________________________________________________________   Discussed status with Dr. Ranell Patrick  on 07/24/21 at 62 and received approval for admission today.  Admission Coordinator:  Genella Mech, CCC-SLP, time 07/24/21/Date 1500   Assessment/Plan: Diagnosis: Debility Does the need for close, 24 hr/day Medical supervision in concert with the patient's rehab needs make it unreasonable for this patient to be served in a less  intensive setting? Yes Co-Morbidities requiring supervision/potential complications: soft tissue infection, degloving injury, obesity, acute blood loss anemia, rhabdomyolysis Due to bladder management, bowel management, safety, skin/wound care, disease management, medication administration, pain management, and patient education, does the patient require 24 hr/day rehab nursing? Yes Does the patient require coordinated care of a physician, rehab nurse, PT, OT to address physical and functional deficits in the context of the above medical diagnosis(es)? Yes Addressing deficits in the following areas: balance, endurance, locomotion, strength, transferring, bowel/bladder control, bathing, dressing, feeding, grooming, and toileting Can the patient actively participate in an intensive therapy program of at least 3 hrs of therapy 5 days a week? Yes The potential for patient to make measurable gains while on inpatient rehab is excellent Anticipated functional outcomes upon discharge from inpatient rehab: modified independent PT, modified  independent OT, independent SLP Estimated rehab length of stay to reach the above functional goals is: 5-7 days Anticipated discharge destination: Home 10. Overall Rehab/Functional Prognosis: excellent   MD Signature: Leeroy Cha, MD
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
## 2021-07-25 LAB — COMPREHENSIVE METABOLIC PANEL
ALT: 41 U/L (ref 0–44)
ALT: 38 U/L (ref 0–44)
AST: 20 U/L (ref 15–41)
AST: 20 U/L (ref 15–41)
Albumin: 2.5 g/dL — ABNORMAL LOW (ref 3.5–5.0)
Albumin: 2.8 g/dL — ABNORMAL LOW (ref 3.5–5.0)
Alkaline Phosphatase: 75 U/L (ref 38–126)
Alkaline Phosphatase: 70 U/L (ref 38–126)
Anion gap: 8 (ref 5–15)
Anion gap: 13 (ref 5–15)
BUN: 14 mg/dL (ref 6–20)
BUN: 20 mg/dL (ref 6–20)
CO2: 29 mmol/L (ref 22–32)
CO2: 24 mmol/L (ref 22–32)
Calcium: 9.3 mg/dL (ref 8.9–10.3)
Calcium: 9.3 mg/dL (ref 8.9–10.3)
Chloride: 100 mmol/L (ref 98–111)
Chloride: 98 mmol/L (ref 98–111)
Creatinine, Ser: 0.75 mg/dL (ref 0.61–1.24)
Creatinine, Ser: 0.75 mg/dL (ref 0.61–1.24)
GFR, Estimated: >60 mL/min (ref >60)
GFR, Estimated: >60 mL/min (ref >60)
Glucose, Bld: 98 mg/dL (ref 70–99)
Glucose, Bld: 110 mg/dL — ABNORMAL HIGH (ref 70–99)
Potassium: 3.9 mmol/L (ref 3.5–5.1)
Potassium: 4 mmol/L (ref 3.5–5.1)
Sodium: 137 mmol/L (ref 135–145)
Sodium: 135 mmol/L (ref 135–145)
Total Bilirubin: 0.4 mg/dL (ref 0.3–1.2)
Total Bilirubin: 0.4 mg/dL (ref 0.3–1.2)
Total Protein: 7.2 g/dL (ref 6.5–8.1)
Total Protein: 7.4 g/dL (ref 6.5–8.1)

## 2021-07-25 LAB — CBC WITH DIFFERENTIAL/PLATELET
Abs Immature Granulocytes: 0.09 10*3/uL — ABNORMAL HIGH (ref 0.00–0.07)
Basophils Absolute: 0 10*3/uL (ref 0.0–0.1)
Basophils Relative: 0 %
Eosinophils Absolute: 0.1 10*3/uL (ref 0.0–0.5)
Eosinophils Relative: 1 %
HCT: 30.4 % — ABNORMAL LOW (ref 39.0–52.0)
Hemoglobin: 9.3 g/dL — ABNORMAL LOW (ref 13.0–17.0)
Immature Granulocytes: 1 %
Lymphocytes Relative: 24 %
Lymphs Abs: 1.8 10*3/uL (ref 0.7–4.0)
MCH: 24.5 pg — ABNORMAL LOW (ref 26.0–34.0)
MCHC: 30.6 g/dL (ref 30.0–36.0)
MCV: 80 fL (ref 80.0–100.0)
Monocytes Absolute: 0.5 10*3/uL (ref 0.1–1.0)
Monocytes Relative: 7 %
Neutro Abs: 5.1 10*3/uL (ref 1.7–7.7)
Neutrophils Relative %: 67 %
Platelets: 594 10*3/uL — ABNORMAL HIGH (ref 150–400)
RBC: 3.8 MIL/uL — ABNORMAL LOW (ref 4.22–5.81)
RDW: 19.4 % — ABNORMAL HIGH (ref 11.5–15.5)
WBC: 7.6 10*3/uL (ref 4.0–10.5)
nRBC: 0 % (ref 0.0–0.2)

## 2021-07-25 NOTE — Progress Notes (Incomplete)
MEWS Guidelines - (patients age 24 and over) ° ° °
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
## 2021-07-25 NOTE — Progress Notes (Addendum)
Inpatient Rehabilitation Admission Medication Review by a Pharmacist  A complete drug regimen review was completed for this patient to identify any potential clinically significant medication issues.  High Risk Drug Classes Is patient taking? Indication by Medication  Antipsychotic No   Anticoagulant Yes Eliquis for DVT  Antibiotic No   Opioid Yes Norco for wound pain  Antiplatelet No   Hypoglycemics/insulin No   Vasoactive Medication Yes Propranolol for tachycardia  Chemotherapy No   Other No      Clinically significant medication issues were identified that warrant physician communication and completion of prescribed/recommended actions by midnight of the next day:  No  Time spent performing this drug regimen review (minutes):   Charnetta Wulff S. Merilynn Finland, PharmD, BCPS Clinical Staff Pharmacist [email or web address] Pasty Spillers 07/25/2021 8:46 AM

## 2021-07-25 NOTE — Progress Notes (Signed)
Spoke with Charmian Muff at 21:47, awaiting for EKG results to be release, to review.  He verbalizes understanding.

## 2021-07-25 NOTE — Progress Notes (Signed)
Inpatient Rehabilitation Center Individual Statement of Services  Patient Name:  Wayne Hunter  Date:  07/25/2021  Welcome to the Inpatient Rehabilitation Center.  Our goal is to provide you with an individualized program based on your diagnosis and situation, designed to meet your specific needs.  With this comprehensive rehabilitation program, you will be expected to participate in at least 3 hours of rehabilitation therapies Monday-Friday, with modified therapy programming on the weekends.  Your rehabilitation program will include the following services:  Physical Therapy (PT), Occupational Therapy (OT), 24 hour per day rehabilitation nursing, Neuropsychology, Care Coordinator, Rehabilitation Medicine, Nutrition Services, and Pharmacy Services  Weekly team conferences will be held on Tuesday to discuss your progress.  Your Inpatient Rehabilitation Care Coordinator will talk with you frequently to get your input and to update you on team discussions.  Team conferences with you and your family in attendance may also be held.  Expected length of stay: 5-7 Days  Overall anticipated outcome: Independent with device  Depending on your progress and recovery, your program may change. Your Inpatient Rehabilitation Care Coordinator will coordinate services and will keep you informed of any changes. Your Inpatient Rehabilitation Care Coordinator's name and contact numbers are listed  below.  The following services may also be recommended but are not provided by the Inpatient Rehabilitation Center:  Driving Evaluations Home Health Rehabiltiation Services Outpatient Rehabilitation Services Vocational Rehabilitation   Arrangements will be made to provide these services after discharge if needed.  Arrangements include referral to agencies that provide these services.  Your insurance has been verified to be:  Workers Comp Your primary doctor is:  No PCP  Pertinent information will be shared with your  doctor and your insurance company.  Inpatient Rehabilitation Care Coordinator:   Dossie Der, [place] [phone] or [place] Information discussed with and copy given to patient by: [name], 07/25/2021, 12:53 PM

## 2021-07-25 NOTE — Progress Notes (Signed)
This Provider was in contact with Dr Wynn Banker, regarding Mr. Dirusso being febrile with tachycardia.  Dr. Doroteo Bradford agrees with giving his  HS Inderal dose Now. Placed a call to ED RN, he verbalizes understanding.
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
## 2021-07-25 NOTE — Plan of Care (Signed)
°  Problem: RH Balance Goal: LTG Patient will maintain dynamic standing with ADLs (OT) Description: LTG:  Patient will maintain dynamic standing balance with assist during activities of daily living (OT)  Flowsheets (Taken 07/25/2021 1623) LTG: Pt will maintain dynamic standing balance during ADLs with: Independent with assistive device   Problem: Sit to Stand Goal: LTG:  Patient will perform sit to stand in prep for activites of daily living with assistance level (OT) Description: LTG:  Patient will perform sit to stand in prep for activites of daily living with assistance level (OT) Flowsheets (Taken 07/25/2021 1623) LTG: PT will perform sit to stand in prep for activites of daily living with assistance level: Independent with assistive device   Problem: RH Grooming Goal: LTG Patient will perform grooming w/assist,cues/equip (OT) Description: LTG: Patient will perform grooming with assist, with/without cues using equipment (OT) Flowsheets (Taken 07/25/2021 1623) LTG: Pt will perform grooming with assistance level of: Independent with assistive device    Problem: RH Bathing Goal: LTG Patient will bathe all body parts with assist levels (OT) Description: LTG: Patient will bathe all body parts with assist levels (OT) Flowsheets (Taken 07/25/2021 1623) LTG: Pt will perform bathing with assistance level/cueing: Set up assist    Problem: RH Dressing Goal: LTG Patient will perform upper body dressing (OT) Description: LTG Patient will perform upper body dressing with assist, with/without cues (OT). Flowsheets (Taken 07/25/2021 1623) LTG: Pt will perform upper body dressing with assistance level of: Set up assist Goal: LTG Patient will perform lower body dressing w/assist (OT) Description: LTG: Patient will perform lower body dressing with assist, with/without cues in positioning using equipment (OT) Flowsheets (Taken 07/25/2021 1623) LTG: Pt will perform lower body dressing with assistance level  of: Set up assist   Problem: RH Toileting Goal: LTG Patient will perform toileting task (3/3 steps) with assistance level (OT) Description: LTG: Patient will perform toileting task (3/3 steps) with assistance level (OT)  Flowsheets (Taken 07/25/2021 1623) LTG: Pt will perform toileting task (3/3 steps) with assistance level: Independent with assistive device   Problem: RH Toilet Transfers Goal: LTG Patient will perform toilet transfers w/assist (OT) Description: LTG: Patient will perform toilet transfers with assist, with/without cues using equipment (OT) Flowsheets (Taken 07/25/2021 1623) LTG: Pt will perform toilet transfers with assistance level of: Independent with assistive device

## 2021-07-25 NOTE — Progress Notes (Signed)
Received a call from Ed RN, reporting Mr. Wayne Hunter is febrile and tachycardic.  Orders Given: Blood Cultures X2, Urine CX and EKG. Dr. Wynn Banker note was reviewed.  Apply Ice Packs, and Give Tylenol.  Awaiting Results.  Check Vitals

## 2021-07-25 NOTE — Progress Notes (Signed)
Physical Therapy Session Note  Patient Details  Name: Wayne UPPAL MRN: [id number] Date of Birth: 1996-06-10  Today's Date: 07/25/2021 PT Individual Time: 1305-1420 PT Individual Time Calculation (min): 75 min   Short Term Goals: Week 1:  PT Short Term Goal 1 (Week 1): =LTGs d/t ELOS  Skilled Therapeutic Interventions/Progress Updates: Pt presented in bed agreeable to therapy. Nsg present to remove IV and provide pain meds. Per pt pain rating 5-6/10, rest breaks provided as needed during session. Pt performed supine to sit with supervision and use of bed features. Pt donned second gown with set up assist and performed Sit to stand with supervision and PTA setting up wound vacs on RW. Pt ambulated ~20ft CGA to nsg station then pt transported to 4th floor ortho gym for time management. Pt participated in car transfer at SUV height with supervision overall and verbal cues for technique. Pt with heavy reliance on BUE to lift body and shift back in seat to facilitate BLE into vehicle. Upon return to w/c and seated rest pt participated in 6MWT with pt ambulating without rest for 5:30 with a total distance of 118ft. Pt encouraged during ambulation to work on step through pattern as pt primarily ambulating with step to pattern and heavy reliance on BUE. Pt was able to intermittently perform step to but with RLE just barely passing LLE and decreased bilateral foot clearance noted. Pt then participated in toe taps to 3in step alternating feel for increased weight bearing tolerance on LLE x 10 as well as hip extension x 10 bilaterally. Pt then transported back to room at end of session and performed ambulatory transfer to bed with supervision as PTA managed wound vacs. Performed sit to supine with supervision and pt left in bed with call bell within reach and needs met.    Therapy Documentation Precautions:  Precautions Precautions: Fall Precaution Comments: Bil wound VACS Restrictions Weight Bearing  Restrictions: Yes RLE Weight Bearing: Weight bearing as tolerated LLE Weight Bearing: Weight bearing as tolerated General:   Vital Signs:   Pain: Pain Assessment Pain Scale: 0-10 Pain Score: 5   Exercises:   Other Treatments:      Therapy/Group: Individual Therapy  Cauy Melody 07/25/2021, 2:36 PM
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
## 2021-07-25 NOTE — Progress Notes (Signed)
RN Attempted to complete a full head to toe skin assessment upon arrival. Pt Complains on intense pain bilaterally to both thighs at wound vac site. When attempting to roll the patient he became tearful and groins with pain. Pt declined further movement. HR was also increased to 107 bpm and pt became diaphoretic and clammy. RN administered PRN Norco for 10/10 pain and also gave the prn muscle relaxer. RN Lauren at the bedside for second attempt. Pt refused again. Both RNs provided education on possible increased risk for pressure ulcers and the benefits of repositioning weight every 2 hours. Wife is at the bedside and states she assisted the patient with cleaning his bottom and did not notice any skin issues. RN did explain that PT and OT will evaluate in the morning and he would need to participate in order for therapy to be effective. PT verbalizes understanding. Charge Nurse Dois Davenport also notified. Vitals rechecked and are currently WNL. Pain is reduced to 2/10 currently. Pt is currently sleeping and appears to be without any distress. RN will continue to monitor. Other wounds assessed  and documented.

## 2021-07-26 LAB — CBC
HCT: 29.7 % — ABNORMAL LOW (ref 39.0–52.0)
Hemoglobin: 9.2 g/dL — ABNORMAL LOW (ref 13.0–17.0)
MCH: 24.6 pg — ABNORMAL LOW (ref 26.0–34.0)
MCHC: 31 g/dL (ref 30.0–36.0)
MCV: 79.4 fL — ABNORMAL LOW (ref 80.0–100.0)
Platelets: 566 10*3/uL — ABNORMAL HIGH (ref 150–400)
RBC: 3.74 MIL/uL — ABNORMAL LOW (ref 4.22–5.81)
RDW: 19.3 % — ABNORMAL HIGH (ref 11.5–15.5)
WBC: 10.1 10*3/uL (ref 4.0–10.5)
nRBC: 0 % (ref 0.0–0.2)

## 2021-07-26 MED ORDER — PIPERACILLIN-TAZOBACTAM 3.375 G IVPB
3.3750 g | Freq: Three times a day (TID) | INTRAVENOUS | Status: DC
  Administered 2021-07-26 – 2021-07-29 (×9): 3.375 g via INTRAVENOUS
  Filled 2021-07-26 (×14): qty 50

## 2021-07-26 MED ORDER — SODIUM CHLORIDE 0.9 % IV SOLN
INTRAVENOUS | Status: DC | PRN
  Administered 2021-08-01: 10 mL/h via INTRAVENOUS

## 2021-07-26 MED ORDER — PROPRANOLOL HCL 10 MG PO TABS
10.0000 mg | ORAL_TABLET | Freq: Two times a day (BID) | ORAL | Status: DC
  Administered 2021-07-26 – 2021-08-01 (×12): 10 mg via ORAL
  Filled 2021-07-26 (×13): qty 1
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
## 2021-07-26 NOTE — Progress Notes (Signed)
Occupational Therapy Session Note  Patient Details  Name: Wayne Hunter MRN: [id number] Date of Birth: April 26, 1996  Today's Date: 07/26/2021 OT Individual Time:  - 60 minutes missed     Short Term Goals: Week 1:  OT Short Term Goal 1 (Week 1): STG = LTG 2/2 ELOS  Skilled Therapeutic Interventions/Progress Updates:    Pt greeted in bed with wife present. He reported constipation and severe abdominal pain due to this. Pt declining therapy due to abdominal pain, 60 minutes missed.   Therapy Documentation Precautions:  Precautions Precautions: Fall Precaution Comments: Bil wound VACS Restrictions Weight Bearing Restrictions: Yes RLE Weight Bearing: Weight bearing as tolerated LLE Weight Bearing: Weight bearing as tolerated Vital Signs: Therapy Vitals Temp: 98.3 F (36.8 C) Pulse Rate: (!) 107 Resp: 18 BP: 111/78 Patient Position (if appropriate): Lying Oxygen Therapy SpO2: 100 % O2 Device: Room Air ADL: ADL Eating: Independent Grooming: Setup Upper Body Bathing: Setup Lower Body Bathing: Minimal assistance Upper Body Dressing: Setup Lower Body Dressing: Not assessed Toileting: Contact guard Toilet Transfer: Contact guard Therapy/Group: Individual Therapy  Wayne Hunter 07/26/2021, 7:26 PM

## 2021-07-26 NOTE — Progress Notes (Signed)
Occupational Therapy Session Note  Patient Details  Name: Wayne Hunter MRN: [id number] Date of Birth: 08-15-1995  Today's Date: 07/26/2021 OT Individual Time: 9622-2979 OT Individual Time Calculation (min): 53 min    Short Term Goals: Week 1:  OT Short Term Goal 1 (Week 1): STG = LTG 2/2 ELOS  Skilled Therapeutic Interventions/Progress Updates:    Pt received in bed stating he was really sore from the PT sessions. His wife was present.  Pt needed to toilet right away. He was able to move without A to EOB and stood to RW with light CGA.  His wife A me with moving 2 wound vacs as pt ambulated to toilet. CGA to sit to control descent. Pt then toileted and cleansed with no A.  Set up with sponge bath. Pt only wants to use a hospital gown as he legs are so sensitive to touch.   He then was able to push to stand using bar on his R (he has the same set up at home) with S.  Ambulated back to edge of bed.   From sitting he worked on UE strength using a 5#  dowel bar.  12 reps 4 sets:  Sh press, Chest press. Bicep curls OH tricep ext  He then finished with "kayaking exercises"  moving bar side to side to engage core strength.   Pt moved back to supine. Pt resting in bed with all needs met. Alarm set.     Therapy Documentation Precautions:  Precautions Precautions: Fall Precaution Comments: Bil wound VACS Restrictions Weight Bearing Restrictions: Yes RLE Weight Bearing: Weight bearing as tolerated LLE Weight Bearing: Weight bearing as tolerated    Vital Signs: Therapy Vitals Temp: (!) 97.4 F (36.3 C) Pulse Rate: (!) 102 Resp: 20 BP: 120/65 Patient Position (if appropriate): Sitting Oxygen Therapy SpO2: 99 % O2 Device: Room Air Pain: Pain Assessment Pain Scale: 0-10 Pain Score: 10-Worst pain ever Pain Type: Surgical pain Pain Location: Leg Pain Orientation: Right;Left Pain Descriptors / Indicators: Constant;Discomfort Pain Intervention(s): Medication (See  eMAR) ADL: ADL Eating: Independent Grooming: Setup Upper Body Bathing: Setup Lower Body Bathing: Minimal assistance Upper Body Dressing: Setup Lower Body Dressing: Not assessed Toileting: Contact guard Toilet Transfer: Contact guard   Therapy/Group: Individual Therapy  Sabrina Keough 07/26/2021, 12:17 PM
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
## 2021-07-26 NOTE — Progress Notes (Signed)
Physical Therapy Session Note  Patient Details  Name: Wayne Hunter MRN: [id number] Date of Birth: August 02, 1995  Today's Date: 07/26/2021 PT Individual Time: 1101-1200 and 1400-1425 PT Individual Time Calculation (min): 59 min and 25 min  Short Term Goals: Week 1:  PT Short Term Goal 1 (Week 1): =LTGs d/t ELOS  Skilled Therapeutic Interventions/Progress Updates:  Session 1: Patient supine in bed on entrance to room. Wife present. Patient alert and agreeable to PT session. Pt feels "okay". Wife relates pt will have procedure to change wound vacuum dressings on Monday. Then is also scheduled for I&D on Wed/ Thu of next week. Educ provided re: pt "discharge" from therapy only to mean that he would not receive further IP rehab therapy as of Monday and will be taken to surgery from IP rehab room. Will be up to surgical team so to placement in acute care or not following procedure d/t fact that pt will be having another procedure soon after. Otherwise pt will return up to IP rehab until going for 2nd surgery and then will be discharged to acute care.   Patient with lower level pain that described earlier in morning since receiving pain medication. Provides numerical rating of 4/10 but relates that will increase with movement.   Therapeutic Activity: Bed Mobility: Patient performed supine <> sit requiring extra time and MinA only for final 20% of rise to seated position. Good technique using log roll to tolerance toward R hip. Transfers: Patient performed sit<>stand and stand pivot transfers throughout session with CGA/ supervision. Provided verbal cues for slow movements to complete all transitions. MaxA for mgmt of wound vacs.  Gait Training:  Patient ambulated 85' x1/ 20' x1 using RW with supervision. Demonstrated hyperlordosis at lumbar spine. Guided pt in gentle pelvic tile to reduce amount of anterior rotation. Disc with pt re: need to perform pelvic tilts in seats or in beds while supine in  order to strengthen pelvic rotation musculature. Provided multimodal cues for proper technique.  Pt guided in stair negotiation training with focus on ascent/ descent of 6" steps. Pt is able to ascend/ descend steps with heavy BHR use for UE support and CGA/ supervision. MaxA for mgmt of wound vacs.  Wheelchair Mobility:  Patient propelled wheelchair 62' x1/ 30' x1 feet with supervision. Provided minimal vc for technique.  Patient supine  in bed at end of session with brakes locked, bed alarm set, and all needs within reach. Setup for punch tray to arrive.    Session 2: Patient supine in bed on entrance to room. Patient alert, but painful, and hesitant to participate in therapy session. Time taken to locate 3 w/c cushions more appropriate for pt as he was noted to struggle with finding comfort sitting on initially provided cushion for w/c. Pt shown softer gel contour cushion, ROHO cushion, and airpod cushion. Then asked to choose better cushion to attempt. Pt chooses ROHO. Placed in w/c.  Patient with 4/10 pain complaint throughout session. But pain level rises throughout session and pt not amenable to therapy session this afternoon. Cushion left in w/c with original cushion in the event that pt does not like ROHO. Other cushions set aside in w/c storage room for ease of location if needed to be switched.   Patient supine  in bed at end of session with brakes locked, bed alarm set, and all needs within reach. Wife present.       Therapy Documentation Precautions:  Precautions Precautions: Fall Precaution Comments: Bil wound VACS Restrictions  Weight Bearing Restrictions: Yes RLE Weight Bearing: Weight bearing as tolerated LLE Weight Bearing: Weight bearing as tolerated General:   Pain: Pain Assessment Pain Scale: 0-10 Pain Score: 4  Pain Type: Surgical pain Pain Location: Leg Pain Orientation: Right Pain Intervention(s): Medication (See eMAR);Repositioned;Emotional  support;Relaxation;Rest  Therapy/Group: Individual Therapy  Loel Dubonnet PT, DPT 07/26/2021, 4:35 PM

## 2021-07-26 NOTE — Progress Notes (Signed)
PROGRESS NOTE   Subjective/Complaints: Temp up to 101.3 yesterday pm , down to 99.5 this am. Denies pain with urination no GI or URI symptoms.  A little sore after PT yesterday but no sig increase in pain, WBC in nl range.  No change in wound vac output  ROS- neg CP, SOB, N/V/D Objective:   No results found. Recent Labs    07/25/21 0512 07/26/21 0457  WBC 7.6 10.1  HGB 9.3* 9.2*  HCT 30.4* 29.7*  PLT 594* 566*    Recent Labs    07/25/21 0512 07/25/21 2015  NA 137 135  K 3.9 4.0  CL 100 98  CO2 29 24  GLUCOSE 98 110*  BUN 14 20  CREATININE 0.75 0.75  CALCIUM 9.3 9.3     Intake/Output Summary (Last 24 hours) at 07/26/2021 0731 Last data filed at 07/25/2021 2030 Gross per 24 hour  Intake 240 ml  Output 950 ml  Net -710 ml         Physical Exam: Vital Signs Blood pressure 113/72, pulse 100, temperature 99 F (37.2 C), resp. rate 18, height 5' 5.75" (1.67 m), weight 93.9 kg, SpO2 96 %.  General: No acute distress Mood and affect are appropriate Heart: Regular rate and rhythm no rubs murmurs or extra sounds Lungs: Clear to auscultation, breathing unlabored, no rales or wheezes Abdomen: Positive bowel sounds, soft nontender to palpation, nondistended Extremities: No clubbing, cyanosis, or edema in RLE, mild swelling in LLE .  Tender around edges of wound vac bilateral but this is unchanged, Left calf a little firm vs Right calf  Skin: large wound vac bilateral thighs vastus lateralis area  Neurologic: Cranial nerves II through XII intact, motor strength is 5/5 in bilateral deltoid, bicep, tricep, grip, hip flexor, knee extensors, ankle dorsiflexor and plantar flexor Sensory exam normal sensation to light touch and proprioception in bilateral upper and lower extremities Cerebellar exam normal finger to nose to finger as well as heel to shin in bilateral upper and lower extremities Musculoskeletal: Full range  of motion in all 4 extremities. No joint swelling   Assessment/Plan: 1. Functional deficits which require 3+ hours per day of interdisciplinary therapy in a comprehensive inpatient rehab setting. Physiatrist is providing close team supervision and 24 hour management of active medical problems listed below. Physiatrist and rehab team continue to assess barriers to discharge/monitor patient progress toward functional and medical goals  Care Tool:  Bathing  Bathing activity did not occur: Safety/medical concerns Body parts bathed by patient: Right arm, Left arm, Chest, Abdomen, Front perineal area, Buttocks, Right upper leg, Left upper leg, Face   Body parts bathed by helper: Right lower leg, Left lower leg     Bathing assist Assist Level: Minimal Assistance - Patient > 75%     Upper Body Dressing/Undressing Upper body dressing Upper body dressing/undressing activity did not occur (including orthotics): Safety/medical concerns What is the patient wearing?: Hospital gown only    Upper body assist Assist Level: Set up assist    Lower Body Dressing/Undressing Lower body dressing    Lower body dressing activity did not occur: Safety/medical concerns What is the patient wearing?: Hospital gown only (politely  declined pants/shorts at time of eval)     Lower body assist       Toileting Toileting    Toileting assist Assist for toileting: Contact Guard/Touching assist     Transfers Chair/bed transfer  Transfers assist  Chair/bed transfer activity did not occur: Safety/medical concerns  Chair/bed transfer assist level: Contact Guard/Touching assist     Locomotion Ambulation   Ambulation assist   Ambulation activity did not occur: Safety/medical concerns  Assist level: Contact Guard/Touching assist Assistive device: Walker-rolling Max distance: 160ft   Walk 10 feet activity   Assist     Assist level: Contact Guard/Touching assist Assistive device:  Walker-rolling   Walk 50 feet activity   Assist    Assist level: Contact Guard/Touching assist Assistive device: Walker-rolling    Walk 150 feet activity   Assist Walk 150 feet activity did not occur: Safety/medical concerns  Assist level: Contact Guard/Touching assist Assistive device: Walker-rolling    Walk 10 feet on uneven surface  activity   Assist Walk 10 feet on uneven surfaces activity did not occur: Safety/medical concerns         Wheelchair     Assist Is the patient using a wheelchair?: Yes Type of Wheelchair: Manual    Wheelchair assist level: Dependent - Patient 0% Max wheelchair distance: 150    Wheelchair 50 feet with 2 turns activity    Assist        Assist Level: Dependent - Patient 0%   Wheelchair 150 feet activity     Assist      Assist Level: Dependent - Patient 0%   Blood pressure 113/72, pulse 100, temperature 99 F (37.2 C), resp. rate 18, height 5' 5.75" (1.67 m), weight 93.9 kg, SpO2 96 %.  Medical Problem List and Plan: 1. Functional deficits due to extensive bilateral thigh wounds secondary to crush injury             -patient may not shower             -ELOS/Goals: 5-7 days             -Admit to CIR 2.  Antithrombotics: -DVT/anticoagulation:  Pharmaceutical: Other (comment) Eliquis for left femoral DVT diagnosed 06/26/21             -antiplatelet therapy: None 3. Pain: continue Tylenol as needed, Flexeril as needed for spasms, Norco as needed 4. Mood: LCSW to evaluate and provide emotional support             -antipsychotic agents: Not applicable 5. Neuropsych: This patient is capable of making decisions on his own behalf. 6. Skin/Wound Care:  Routine skin checks.             -- Bilateral thigh wound VAC; next wound VAC change 07/29/2021 also scheduled on 1/23 7. Fluids/Electrolytes/Nutrition: Routine I's and O's and follow-up chemistries 8.  Left lower extremity DVT: continue Eliquis.  Monitor for edema 9.  Last Bm 2 days ago: continue anti-constipation regimen. Add magnesium gluconate 250mg  HS.  10. Tachycardia: increase propanolol 10mg  BID Vitals:   07/26/21 0220 07/26/21 0601  BP: 119/68 113/72  Pulse: (!) 105 100  Resp: 18 18  Temp: 98.5 F (36.9 C) 99 F (37.2 C)  SpO2: 96% 96%   BP ok 11.  ABLA stable  at 9.3- serosanguinous drainage in wound vac tubing but no large volume output   12.  Fever without new symptoms WBC normal , contacted Plastic surgery Dr 07/28/21 who advised IV abx, will  get pharm consult , most likely source is thigh wounds  LOS: 2 days A FACE TO FACE EVALUATION WAS PERFORMED  Erick Colacendrew E Abbiegail Landgren 07/26/2021, 7:31 AM

## 2021-07-26 NOTE — Discharge Summary (Signed)
Physician Discharge Summary  Patient ID: DONEVAN BILLER MRN: [id number] DOB/AGE: 1996-05-27 26 y.o.  Admit date: 07/24/2021 Discharge date: 08/01/2021  Discharge Diagnoses:  Principal Problem:   Debility Debility secondary to bilateral thigh trauma and deep tissue loss DVT-left lower extremity Tachycardia Acute blood loss anemia Fever  Discharged Condition: good  Significant Diagnostic Studies: DG Abd 1 View  Result Date: 07/27/2021 CLINICAL DATA:  Abdominal pain EXAM: ABDOMEN - 1 VIEW COMPARISON:  None. FINDINGS: Bowel gas pattern is nonspecific. Small amount of stool is seen in colon. No abnormal masses or calcifications are seen. Small round calcific density in the left side of pelvis may be vascular. There is mild levoscoliosis in the lumbar spine. IMPRESSION: No radiographic abnormality is seen in the abdomen. Electronically Signed   By: Elmer Picker M.D.   On: 07/27/2021 12:49    Labs:  Basic Metabolic Panel: Recent Labs  Lab 07/25/21 2015  NA 135  K 4.0  CL 98  CO2 24  GLUCOSE 110*  BUN 20  CREATININE 0.75  CALCIUM 9.3    CBC: Recent Labs  Lab 07/26/21 0457  WBC 10.1  HGB 9.2*  HCT 29.7*  MCV 79.4*  PLT 566*    CBG: No results for input(s): GLUCAP in the last 168 hours.  Brief HPI:   KEONTAE LEVINGSTON is a 26 y.o. male involved in a traumatic bilateral thigh crush injury on 06/04/2021.  He required multiple incision and debridement procedures.  On 07/16/2021, he presented for I&D and inpatient rehabilitation was recommended.  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  Bilateral thigh wound vacs remained in place.   Hospital Course: MACIO KISSOON was admitted to rehab 07/24/2021 for inpatient therapies to consist of PT, ST and OT at least three hours five days a week. Past admission physiatrist, therapy team and rehab RN have worked together to provide customized collaborative inpatient rehab.  On the evening of 1/12, he  was noted to have fever and blood cultures, urine culture and EKG were obtained.  Plastic surgery was consulted the following morning and he was placed on Zosyn. Underwent the following on 1/16 by Dr. Marla Roe:  PROCEDURE:  Right thigh:  Excision of wound skin and fat 2 x 4 cm 2.   Right thigh: Placement of Acell powder 1 gm and VAC 3.   Left thigh:  excision of wound 2 x 6 cm skin and fat 4.   Left thigh: placement of Myriad powder 1 gm and 7 x 10 cm sheet and VAC  Tolerated well. Zosyn was discontinued and Unasyn was started post-procedure. He remained afebrile. Hemoglobin remained stable at 9.2. contacted plastic surgery office on 1/18 for further instructions regarding antibiotics: IV versus oral, etc. Call not returned. Patient afebrile with stable VS on discharge. Augmentin BID prescribed for seven days. Patient and wife instructed to discuss further antibiotic management with Dr. Marla Roe on Monday 1/23 when returning for outpatient surgery.  Blood pressures were monitored on TID basis and remained stable.  Rehab course: During patient's stay in rehab weekly team conferences were held to monitor patient's progress, set goals and discuss barriers to discharge. At admission, patient required min assist with basic self-care skills, contact guard assist with rolling walker  He has had improvement in activity tolerance, balance, postural control as well as ability to compensate for deficits. He has had improvement in functional use RLE/LLE as well as improvement in awareness.   Disposition:  Discharge disposition: 01-Home  or Self Care ° ° ° ° ° ° ° °Diet: Regular ° °Special Instructions: ° °No driving, alcohol consumption or tobacco use.  ° °30-35 minutes were spent on discharge planning and discharge summary.  °Discharge Instructions   ° ° Discharge patient   Complete by: As directed °  ° Discharge disposition: 01-Home or Self Care  ° Discharge patient date: 08/01/2021  ° °  ° °Allergies as  of 08/01/2021   ° °   Reactions  ° Bee Venom Swelling  ° °  ° °  °Medication List  °  ° °TAKE these medications   ° °acetaminophen 325 MG tablet °Commonly known as: TYLENOL °Take 1-2 tablets (325-650 mg total) by mouth every 6 (six) hours as needed for mild pain (pain score 1-3 or temp > 100.5). °  °amoxicillin-clavulanate 875-125 MG tablet °Commonly known as: AUGMENTIN °Take 1 tablet by mouth every 12 (twelve) hours. °  °calcium citrate 950 (200 Ca) MG tablet °Commonly known as: CALCITRATE - dosed in mg elemental calcium °Take 1 tablet (200 mg of elemental calcium total) by mouth 2 (two) times daily. °  °cyclobenzaprine 5 MG tablet °Commonly known as: FLEXERIL °Take 1-2 tablets (5-10 mg total) by mouth 3 (three) times daily as needed for muscle spasms. °What changed:  °how much to take °how to take this °when to take this °reasons to take this °  °Eliquis 5 MG Tabs tablet °Generic drug: apixaban °Take 1 tablet (5 mg total) by mouth every 12 (twelve) hours. °  °ferrous sulfate 325 (65 FE) MG tablet °Take 1 tablet (325 mg total) by mouth 3 (three) times daily after meals. °  °ibuprofen 200 MG tablet °Commonly known as: ADVIL °Take 400 mg by mouth every 8 (eight) hours as needed (BACK PAIN.). °  °magnesium gluconate 500 MG tablet °Commonly known as: MAGONATE °Take 0.5 tablets (250 mg total) by mouth at bedtime. °  °multivitamin with minerals Tabs tablet °Take 1 tablet by mouth daily. °  °Natural Vitamin D-3 125 MCG (5000 UT) Tabs °Generic drug: Cholecalciferol °Take 1 tablet by mouth daily. °  °nutrition supplement (JUVEN) Pack °Take 1 packet by mouth 2 (two) times daily between meals. °  °oxyCODONE-acetaminophen 5-325 MG tablet °Commonly known as: PERCOCET/ROXICET °Take 1-2 tablets by mouth every 12 (twelve) hours as needed for severe pain °  °propranolol 10 MG tablet °Commonly known as: INDERAL °Take 1 tablet (10 mg total) by mouth 2 (two) times daily. °  °senna-docusate 8.6-50 MG tablet °Commonly known as:  Senokot-S °Take 2 tablets by mouth 2 (two) times daily. °  °vitamin C 1000 MG tablet °Take 1 tablet (1,000 mg total) by mouth daily. °  ° °  ° ° Follow-up Information   ° ° Dillingham, Claire S, DO Follow up.   °Specialty: Plastic Surgery °Why: Nothing to eat or drink after midnight Sunday night, 08/04/2021 for surgery on Monday, 08/05/2021. °Contact information: °[address] °[address] °[place] [address] [phone] ° ° °  °  ° ° Swartz, Zachary T, MD Follow up.   °Specialty: Physical Medicine and Rehabilitation °Why: As needed °Contact information: °[address] °[address] °[place]  [address] °[phone] ° ° °  °  ° °  °  ° °  ° ° °Signed: °Sandra J Setzer °08/01/2021, 9:44 AM °  °

## 2021-07-26 NOTE — Progress Notes (Signed)
Physical Therapy Session Note  Patient Details  Name: RMANI KELLOGG MRN: [id number] Date of Birth: 10-25-1995  Today's Date: 07/26/2021 PT Individual Time: 0801-0858 PT Individual Time Calculation (min): 57 min   Short Term Goals: Week 1:  PT Short Term Goal 1 (Week 1): =LTGs d/t ELOS  Skilled Therapeutic Interventions/Progress Updates:  Pt received supine in bed, writhing in pain which he reported at 8/10 in BLEs. Contacted nursing to administer pain meds and offered repositioning and distraction throughout session for pain modulation. Emphasis of session on transfers and gait training. Pt verbalized "overdoing it" yesterday and is "extremely sore". Lengthy discussion regarding importance of pacing activity and taking breaks to allow body to rest, pt verbalized understanding. Pt performed supine <>sit EOB slowly w/min A for BLE management. Min verbal cues provided for technique, as pt demonstrated pushing up into a reverse plank position to crawl toward EOB. Encouraged pt to swing BLEs off bed first and then push trunk up to reduce strain on BLEs, which pt able to demonstrate w/min A for BLE management and pain. Nursing present to administer pain meds and noted pt's LLE exhibited more edema than RLE. After several minutes, pt performed several sit <>stands from elevated EOB to RW slowly w/min A. Noted significant anterior trunk flexion in standing, provided min verbal and tactile cues for glute activation to facilitate upright posture. Pt able to take very few steps w/RW slowly, exhibiting forward flexed trunk, overreliance on BUEs and step-to pattern despite mod verbal cues for posture. Regressed to standing alternating marches to facilitate lateral weight shift and single leg stability, noted difficulty maintaining balance on LLE > RLE. Pt requested to return to supine and performed sit <>supine slowly w/min A for BLE management and pain. Pt was left supine in bed, all needs in reach.   Therapy  Documentation Precautions:  Precautions Precautions: Fall Precaution Comments: Bil wound VACS Restrictions Weight Bearing Restrictions: Yes RLE Weight Bearing: Weight bearing as tolerated LLE Weight Bearing: Weight bearing as tolerated   Therapy/Group: Individual Therapy Jill Alexanders Charlita Brian, PT, DPT  07/26/2021, 7:44 AM

## 2021-07-26 NOTE — Progress Notes (Signed)
Patient ID: Wayne Hunter, male   DOB: 1995/08/08, 26 y.o.   MRN: [id number] Spoke with [employer] CM to give update regarding starting IV antibiotics and his need for rolling walker. Have faxed order for rolling walker for her to begin working on and will update her on Monday regarding IV antibiotics and length of need. Continue to follow and work on discharge needs.

## 2021-07-27 ENCOUNTER — Inpatient Hospital Stay (HOSPITAL_COMMUNITY): Payer: PRIVATE HEALTH INSURANCE

## 2021-07-27 DIAGNOSIS — R5381 Other malaise: Secondary | ICD-10-CM

## 2021-07-27 LAB — URINE CULTURE
Culture: 20000 — AB
Special Requests: NORMAL

## 2021-07-27 IMAGING — CR DG ABDOMEN 1V
2 series · 2 of 2 positions shown · non-contrast
Comparison: None.

CLINICAL DATA: Abdominal pain

EXAM:
ABDOMEN - 1 VIEW

[abdomen kub (1 of 2)]
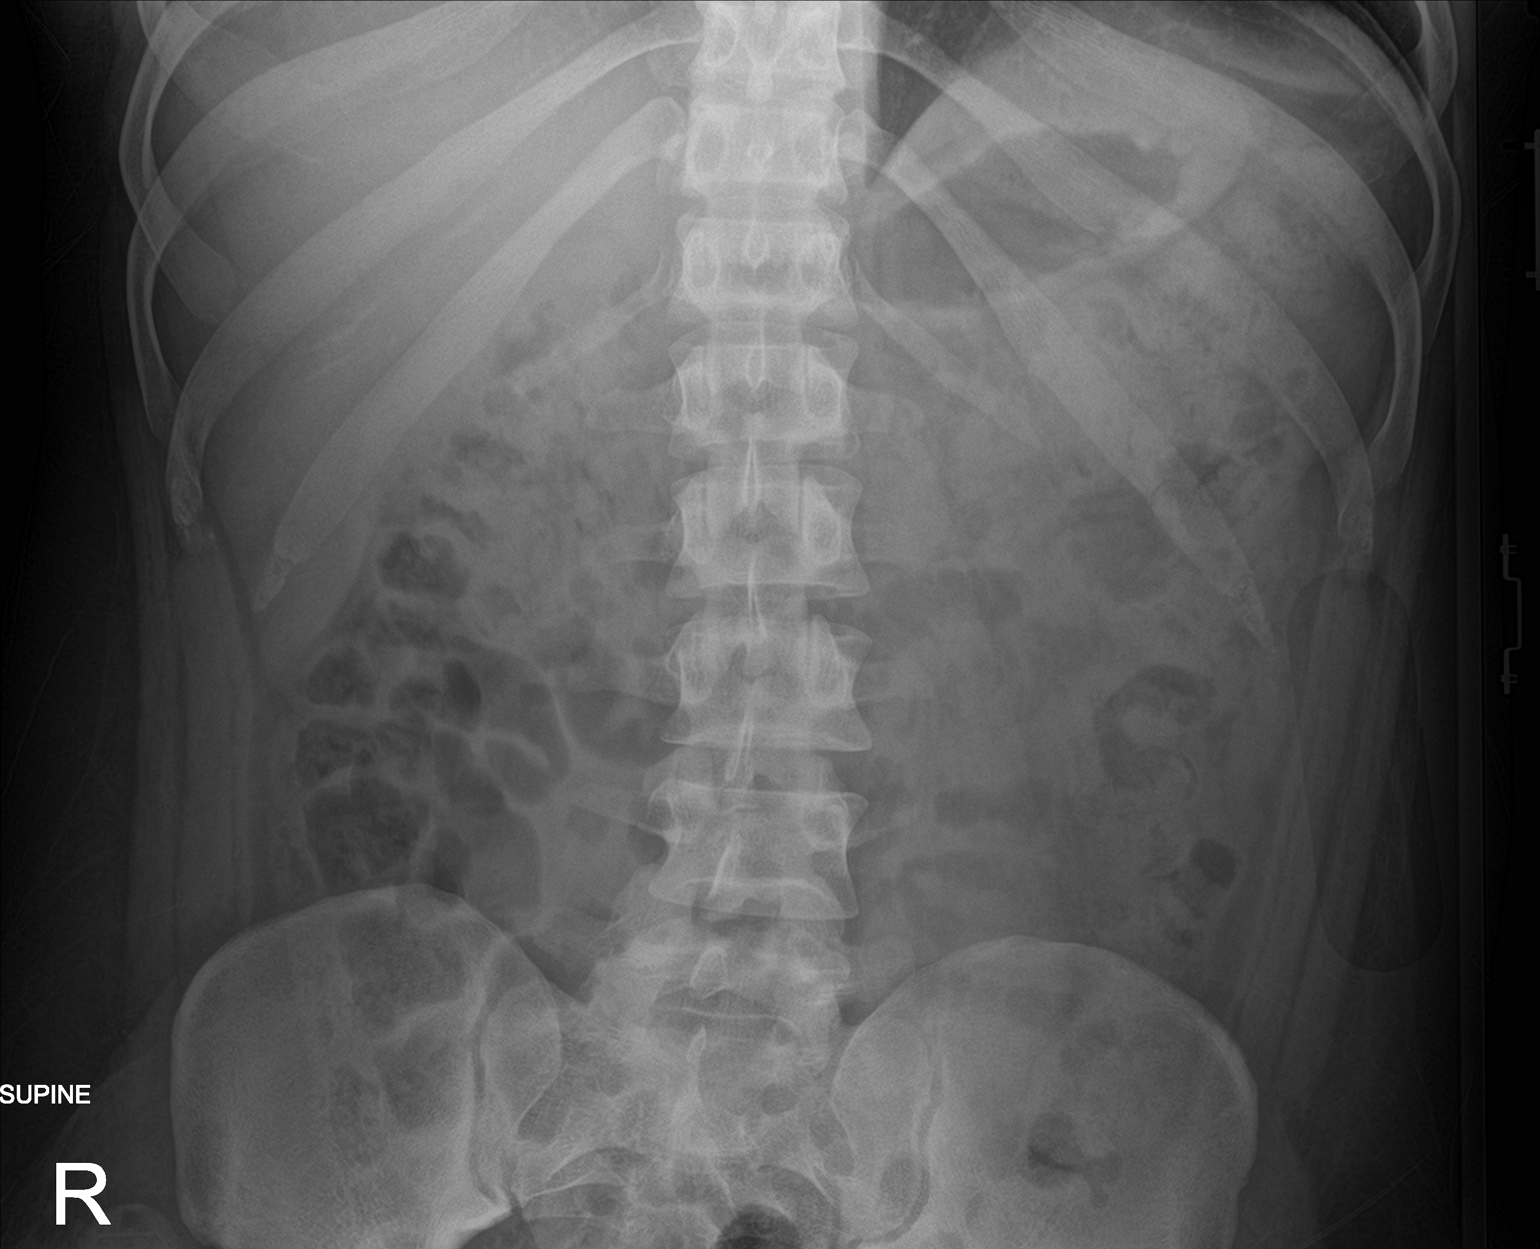

[abdomen kub (2 of 2)]
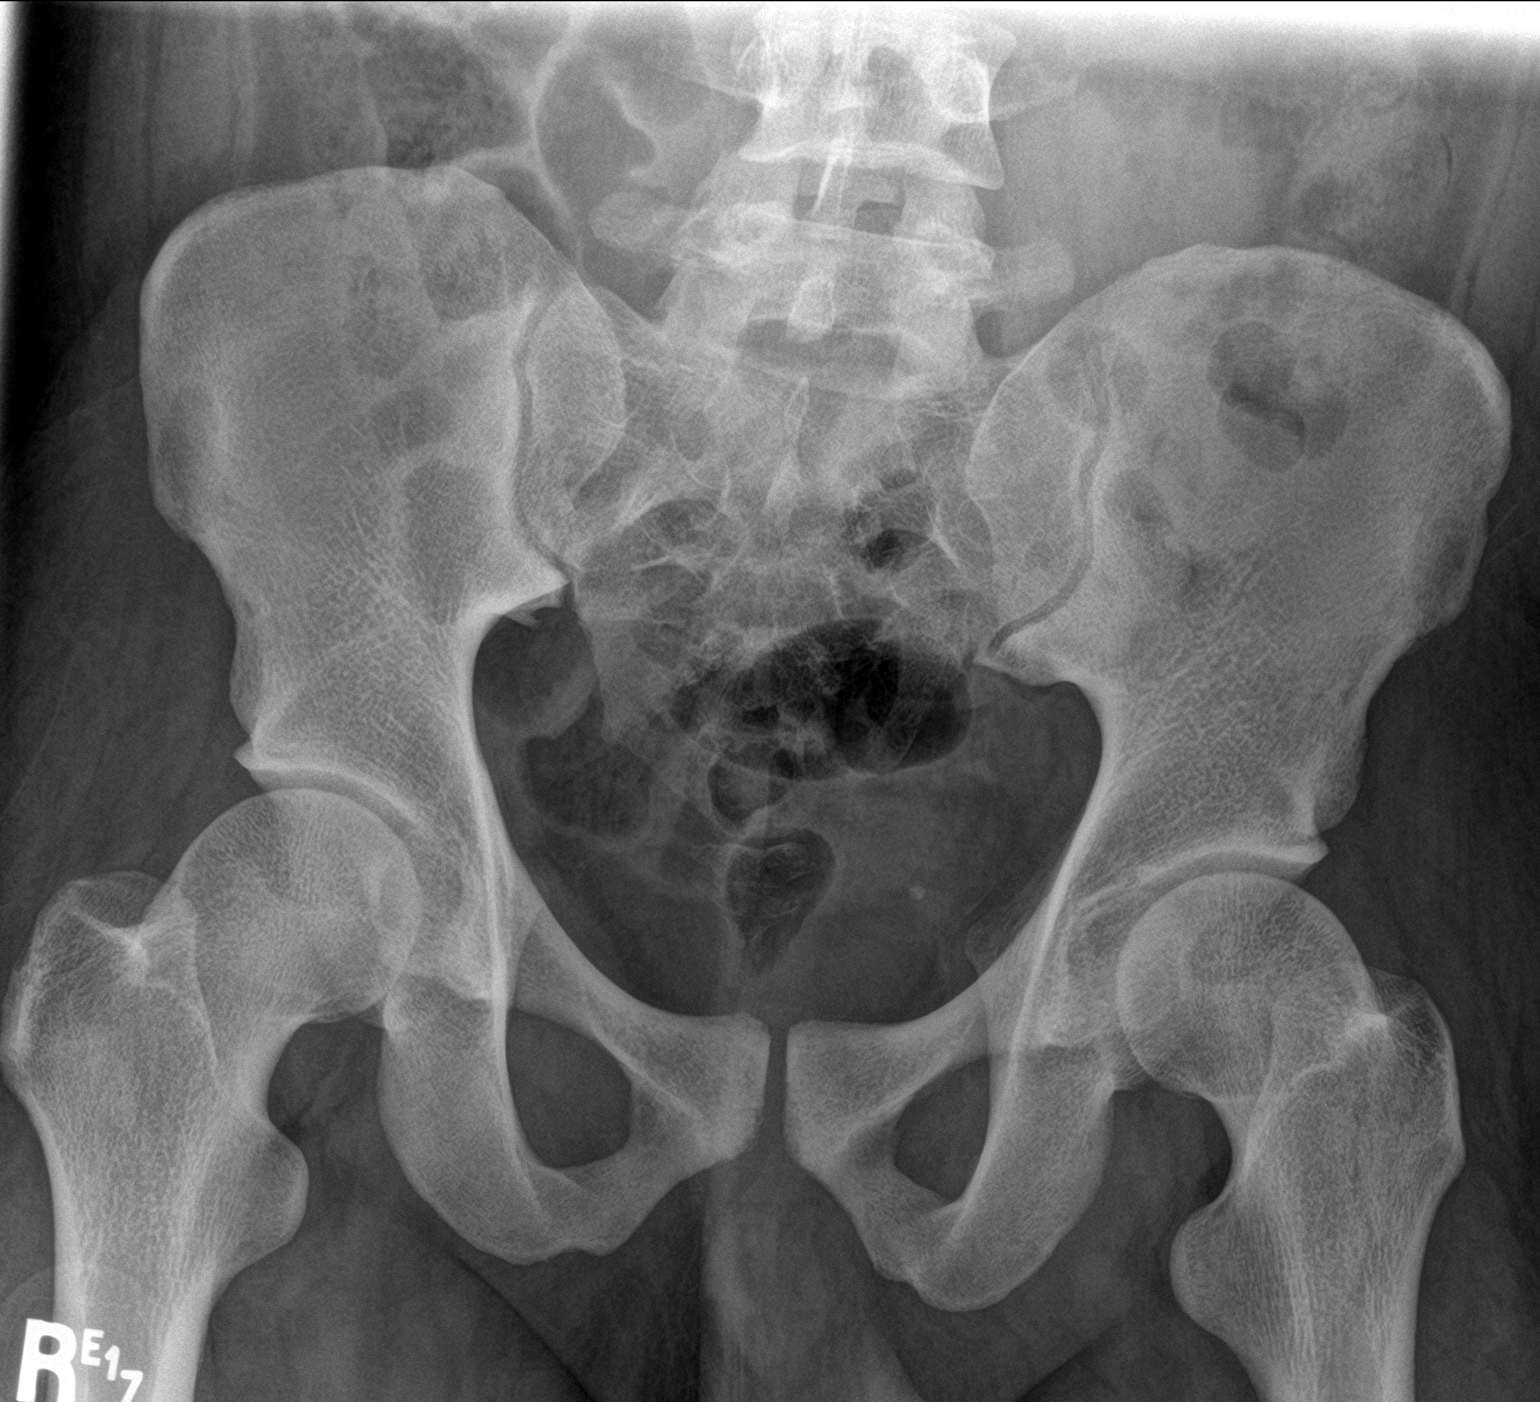

[2 of 2 positions shown; findings below may reference images not displayed]

FINDINGS: Bowel gas pattern is nonspecific. Small amount of stool is seen in
colon. No abnormal masses or calcifications are seen. Small round
calcific density in the left side [DATE] be vascular. There
is mild levoscoliosis in the lumbar spine.
IMPRESSION: No radiographic abnormality is seen in the abdomen.

## 2021-07-27 MED ORDER — BISACODYL 10 MG RE SUPP
10.0000 mg | Freq: Once | RECTAL | Status: AC
  Administered 2021-07-27: 10 mg via RECTAL
  Filled 2021-07-27: qty 1

## 2021-07-27 MED ORDER — SENNOSIDES-DOCUSATE SODIUM 8.6-50 MG PO TABS
2.0000 | ORAL_TABLET | Freq: Two times a day (BID) | ORAL | Status: DC
  Administered 2021-07-27 – 2021-07-31 (×9): 2 via ORAL
  Filled 2021-07-27 (×10): qty 2

## 2021-07-27 MED ORDER — SORBITOL 70 % SOLN
30.0000 mL | Freq: Once | Status: AC
  Administered 2021-07-27: 30 mL via ORAL
  Filled 2021-07-27: qty 30

## 2021-07-27 NOTE — Progress Notes (Signed)
Physical Therapy Session Note  Patient Details  Name: Wayne Hunter MRN: [id number] Date of Birth: [birth year]  Today's Date: 07/27/2021 PT Individual Time: 1000-1016 PT Individual Time Calculation (min): 16 min   Short Term Goals: Week 1:  PT Short Term Goal 1 (Week 1): =LTGs d/t ELOS  Skilled Therapeutic Interventions/Progress Updates:  Pt seated EOB with NT on entrance to room. Has just returned from toileting where he had a bowel movement. Pt demonstrating facial grimace and c/o "belly" pain but not nausea. Pain did make him nauseous on onset at end of PT session this morning.   Assisted with minA to supine. Requires consistent vc/ tc to improve positioning of BLE and UB. MinA for shoulder positioning. Wound vacs returned to end of bed and plugged in. Pt positioned for comfort. Nursing alerted as to abdominal pain vs nausea and potential need for medication. RN to f/u.   Pt missed 14 min of skilled therapy due to pain. Will re-attempt as schedule and pt availability permits.   Therapy Documentation Precautions:  Precautions Precautions: Fall Precaution Comments: Bil wound VACS Restrictions Weight Bearing Restrictions: Yes RLE Weight Bearing: Weight bearing as tolerated LLE Weight Bearing: Weight bearing as tolerated General:   Vital Signs:  Pain:  Pt c/o of abdominal pain throughout session this day. No numerical score provided - Faces Pain scale at 7/10 with extreme discomfort in abdomen.   Therapy/Group: Individual Therapy  Loel Dubonnet PT, DPT 07/27/2021, 10:02 AM

## 2021-07-27 NOTE — Progress Notes (Signed)
Physical Therapy Session Note  Patient Details  Name: Wayne Hunter MRN: [id number] Date of Birth: 1996/04/09  Today's Date: 07/27/2021 PT Individual Time: Session 1: 0800-0903      Session 2: [id number] PT Individual Time Calculation (min):  Session 1: 63 min  Session 2: 56 min   Short Term Goals: Week 1:  PT Short Term Goal 1 (Week 1): =LTGs d/t ELOS  Skilled Therapeutic Interventions/Progress Updates:  Session 1: Chart reviewed and pt agreeable to therapy. Pt received supine in bed with 8/10 c/o pain in BLE at wound sites. Also of note, c/o stomach pain but initially denied nausea and agreed to continue PT. Session focused on ambulation endurance for household accessibility and safety awareness with mobility in setting of wounds. Pt initiated session with supine > sit using MinA for LE management. Pt then completed SST transfer to [facility] with MinA + RW. Of note, pt required extensive time for management of lines. Family was present and able to explain safe placement of lines during mobility. Pt then transferred to hallway for time management. In hallway, pt completed 2x34ft amb with CGA + RW and 1 rest break. Of note, pt displayed decreased R stride length. After 2nd amb, pt c/o nausea and increased stomach pain. Pt then had episode of emesis. RN was notified and pt was returned to room. Pt required MinA + RW to return to bed. Pt continued to report nausea, and RN entered room to administer medication. Pt was left supine in bed in care of RN with alarm engaged, nurse call bell and all needs in reach.   Session 2: Chart reviewed and pt agreeable to therapy. Pt received supine in bed with 2/10 c/o pain in BLE at wound sites. Also of note, pt reported stomach pain had subsided. Session focused on ambulation endurance for household accessibility, safety awareness, and mitigation of gait deficits noted in session 1. Pt initiated session with supine > sit using CGA for LE management. Pt then completed  SST transfer to [facility] with CGA + RW. Pt then transferred to hallway for time management. In hallway, pt completed 3x116ft amb with CGA + RW + WC follow and 2 rest breaks. During amb, pt was able to decrease R step length with VC and decrease of overall stride length. pt displayed decreased R stride length. After 2nd amb, pt was instructed to complete the 3rd amb with attention to endurance and amb distance, with the task to amb away and return to the same location without need of seated rest break. Pt was able to amb 59ft+turn+50ft with CGA + RW + WC follow. Pt then returned to room, but reported increased discomfort in seated position, so pt was returned to bed with CGA + RW and bed was placed in upright position to tolerance. Pt was left with alarm engaged, nurse call bell and all needs in reach.   Therapy Documentation Precautions:  Precautions Precautions: Fall Precaution Comments: Bil wound VACS Restrictions Weight Bearing Restrictions: Yes RLE Weight Bearing: Weight bearing as tolerated LLE Weight Bearing: Weight bearing as tolerated   Therapy/Group: Individual Therapy  Marquette Saa, PT, DPT 07/27/2021, 3:56 PM

## 2021-07-27 NOTE — Progress Notes (Signed)
PROGRESS NOTE   Subjective/Complaints: Tmax has been 99 x 24+ hr, states he has not taken pain meds since yesterday pm  ROS- neg CP, SOB, N/V/D Objective:   No results found. Recent Labs    07/25/21 0512 07/26/21 0457  WBC 7.6 10.1  HGB 9.3* 9.2*  HCT 30.4* 29.7*  PLT 594* 566*    Recent Labs    07/25/21 0512 07/25/21 2015  NA 137 135  K 3.9 4.0  CL 100 98  CO2 29 24  GLUCOSE 98 110*  BUN 14 20  CREATININE 0.75 0.75  CALCIUM 9.3 9.3     Intake/Output Summary (Last 24 hours) at 07/27/2021 0730 Last data filed at 07/27/2021 5573 Gross per 24 hour  Intake 340.04 ml  Output 955 ml  Net -614.96 ml         Physical Exam: Vital Signs Blood pressure (!) 101/59, pulse (!) 105, temperature 99 F (37.2 C), temperature source Oral, resp. rate 18, height 5' 5.75" (1.67 m), weight 93.9 kg, SpO2 95 %.  General: No acute distress Mood and affect are appropriate Heart: Regular rate and rhythm no rubs murmurs or extra sounds Lungs: Clear to auscultation, breathing unlabored, no rales or wheezes Abdomen: Positive bowel sounds, soft nontender to palpation, nondistended Extremities: No clubbing, cyanosis, or edema in RLE, mild swelling in LLE .  Tender around edges of wound vac R>L but this is unchanged, Left calf a little firm vs Right calf  Skin: large wound vac bilateral thighs vastus lateralis area  Neurologic: Cranial nerves II through XII intact, motor strength is 5/5 in bilateral deltoid, bicep, tricep, grip, hip flexor, knee extensors, ankle dorsiflexor and plantar flexor Sensory exam normal sensation to light touch and proprioception in bilateral upper and lower extremities Cerebellar exam normal finger to nose to finger as well as heel to shin in bilateral upper and lower extremities Musculoskeletal: Full range of motion in all 4 extremities. No joint swelling   Assessment/Plan: 1. Functional deficits which  require 3+ hours per day of interdisciplinary therapy in a comprehensive inpatient rehab setting. Physiatrist is providing close team supervision and 24 hour management of active medical problems listed below. Physiatrist and rehab team continue to assess barriers to discharge/monitor patient progress toward functional and medical goals  Care Tool:  Bathing  Bathing activity did not occur: Safety/medical concerns Body parts bathed by patient: Right arm, Left arm, Chest, Abdomen, Front perineal area, Buttocks, Right upper leg, Left upper leg, Face, Right lower leg, Left lower leg   Body parts bathed by helper: Right lower leg, Left lower leg     Bathing assist Assist Level: Set up assist     Upper Body Dressing/Undressing Upper body dressing Upper body dressing/undressing activity did not occur (including orthotics): Safety/medical concerns What is the patient wearing?: Hospital gown only    Upper body assist Assist Level: Independent    Lower Body Dressing/Undressing Lower body dressing    Lower body dressing activity did not occur: Safety/medical concerns What is the patient wearing?: Hospital gown only     Lower body assist       Toileting Toileting    Toileting assist Assist for toileting:  Independent     Transfers Chair/bed transfer  Transfers assist  Chair/bed transfer activity did not occur: Safety/medical concerns  Chair/bed transfer assist level: Contact Guard/Touching assist     Locomotion Ambulation   Ambulation assist   Ambulation activity did not occur: Safety/medical concerns  Assist level: Contact Guard/Touching assist Assistive device: Walker-rolling Max distance: 173ft   Walk 10 feet activity   Assist     Assist level: Contact Guard/Touching assist Assistive device: Walker-rolling   Walk 50 feet activity   Assist    Assist level: Contact Guard/Touching assist Assistive device: Walker-rolling    Walk 150 feet  activity   Assist Walk 150 feet activity did not occur: Safety/medical concerns  Assist level: Contact Guard/Touching assist Assistive device: Walker-rolling    Walk 10 feet on uneven surface  activity   Assist Walk 10 feet on uneven surfaces activity did not occur: Safety/medical concerns         Wheelchair     Assist Is the patient using a wheelchair?: Yes Type of Wheelchair: Manual    Wheelchair assist level: Dependent - Patient 0% Max wheelchair distance: 150    Wheelchair 50 feet with 2 turns activity    Assist        Assist Level: Dependent - Patient 0%   Wheelchair 150 feet activity     Assist      Assist Level: Dependent - Patient 0%   Blood pressure (!) 101/59, pulse (!) 105, temperature 99 F (37.2 C), temperature source Oral, resp. rate 18, height 5' 5.75" (1.67 m), weight 93.9 kg, SpO2 95 %.  Medical Problem List and Plan: 1. Functional deficits due to extensive bilateral thigh wounds secondary to crush injury             -patient may not shower             -ELOS/Goals: 5-7 days             -Admit to CIR 2.  Antithrombotics: -DVT/anticoagulation:  Pharmaceutical: Other (comment) Eliquis for left femoral DVT diagnosed 06/26/21             -antiplatelet therapy: None 3. Pain: continue Tylenol as needed, Flexeril as needed for spasms, Norco as needed 4. Mood: LCSW to evaluate and provide emotional support             -antipsychotic agents: Not applicable 5. Neuropsych: This patient is capable of making decisions on his own behalf. 6. Skin/Wound Care:  Routine skin checks.             -- Bilateral thigh wound VAC; next wound VAC change 07/29/2021 also scheduled on 1/23 7. Fluids/Electrolytes/Nutrition: Routine I's and O's and follow-up chemistries 8.  Left lower extremity DVT: continue Eliquis.  Monitor for edema 9. Last Bm 2 days ago: continue anti-constipation regimen. Add magnesium gluconate 250mg  HS.  10. Tachycardia: increase  propanolol 10mg  BID Vitals:   07/26/21 1939 07/27/21 0457  BP: 119/71 (!) 101/59  Pulse: (!) 109 (!) 105  Resp: 18 18  Temp: 98 F (36.7 C) 99 F (37.2 C)  SpO2: 100% 95%   BP ok 11.  ABLA stable  at 9.3- serosanguinous drainage in wound vac tubing but no large volume output   12.  Fever without new symptoms WBC normal , contacted Plastic surgery Dr 07/28/21 who advised IV abx,most likely source is thigh wounds, now on Zosyn, tmax 99,on hydrocodone for pain  LOS: 3 days A FACE TO FACE EVALUATION WAS PERFORMED  Wayne Hunter Borys 07/27/2021, 7:30 AM

## 2021-07-27 NOTE — Progress Notes (Signed)
Called by nursing for abd pain, pt was "dry heaving".  No diarrhea.  Had BM this am . No bleeding  Pt sleeping when I entered room, awakened to voice, no current pain c/o , pt states some improvement when passing gas   Discussed KUB results, moderate amt gas in RIght lg colon  and stool in left lg colon Will adjust laxatives

## 2021-07-28 NOTE — Progress Notes (Signed)
Physical Therapy Session Note  Patient Details  Name: EDINSON DOMEIER MRN: [id number] Date of Birth: [birth year]  Today's Date: 07/28/2021 PT Individual Time: 1500-1535 PT Individual Time Calculation (min): 35 min   Short Term Goals: Week 1:  PT Short Term Goal 1 (Week 1): =LTGs d/t ELOS  Skilled Therapeutic Interventions/Progress Updates:   Chart reviewed and pt agreeable to therapy. Pt received supine in bed with 2/10 c/o pain in BLE at wound sites. Session focused on ambulation endurance for household accessibility, safety awareness, and mitigation of gait deficits. Pt initiated session with supine > sit using SBA for LE management. Pt then completed SST transfer to [facility] with SBA + RW. Pt then transferred to hallway for time management. In hallway, pt completed 1x191ft amb with SBA + RW + WC follow. Of note, pt was able to judge activity endurance to leave room and return without rest break. Pt then completed 2x33ft with emphasis on gait mechanics and SBA + RW + WC. During amb, pt was able to display B foot clearance to mitigate previous step-to pattern. Pt then returned to room and was left seated in [facility] with alarm engaged, nurse call bell and all needs in reach.   Therapy Documentation Precautions:  Precautions Precautions: Fall Precaution Comments: Bil wound VACS Restrictions Weight Bearing Restrictions: Yes RLE Weight Bearing: Weight bearing as tolerated LLE Weight Bearing: Weight bearing as tolerated     Therapy/Group: Individual Therapy  Perrin Maltese, PT, DPT 07/28/2021, 3:53 PM

## 2021-07-28 NOTE — Progress Notes (Signed)
PROGRESS NOTE   Subjective/Complaints: No abd pain after having several BMs yesterday and today  Disucussed wound vac change in OR tomorrow and one planned on 1/23 ROS- neg CP, SOB, N/V/D Objective:   DG Abd 1 View  Result Date: 07/27/2021 CLINICAL DATA:  Abdominal pain EXAM: ABDOMEN - 1 VIEW COMPARISON:  None. FINDINGS: Bowel gas pattern is nonspecific. Small amount of stool is seen in colon. No abnormal masses or calcifications are seen. Small round calcific density in the left side of pelvis may be vascular. There is mild levoscoliosis in the lumbar spine. IMPRESSION: No radiographic abnormality is seen in the abdomen. Electronically Signed   By: Ernie AvenaPalani  Rathinasamy M.D.   On: 07/27/2021 12:49   Recent Labs    07/26/21 0457  WBC 10.1  HGB 9.2*  HCT 29.7*  PLT 566*    Recent Labs    07/25/21 2015  NA 135  K 4.0  CL 98  CO2 24  GLUCOSE 110*  BUN 20  CREATININE 0.75  CALCIUM 9.3     Intake/Output Summary (Last 24 hours) at 07/28/2021 1001 Last data filed at 07/28/2021 0657 Gross per 24 hour  Intake 418.11 ml  Output 1030 ml  Net -611.89 ml         Physical Exam: Vital Signs Blood pressure (!) 106/54, pulse 98, temperature 98.8 F (37.1 C), resp. rate 18, height 5' 5.75" (1.67 m), weight 93.9 kg, SpO2 96 %.  General: No acute distress Mood and affect are appropriate Heart: Regular rate and rhythm no rubs murmurs or extra sounds Lungs: Clear to auscultation, breathing unlabored, no rales or wheezes Abdomen: Positive bowel sounds, soft nontender to palpation, nondistended Extremities: No clubbing, cyanosis, or edema in RLE, mild swelling in LLE .  Tender around edges of wound vac R>L but this is unchanged, Left calf a little firm vs Right calf  Skin: large wound vac bilateral thighs vastus lateralis area  Neurologic: Cranial nerves II through XII intact, motor strength is 5/5 in bilateral deltoid, bicep,  tricep, grip, hip flexor, knee extensors, ankle dorsiflexor and plantar flexor Sensory exam normal sensation to light touch and proprioception in bilateral upper and lower extremities Cerebellar exam normal finger to nose to finger as well as heel to shin in bilateral upper and lower extremities Musculoskeletal: Full range of motion in all 4 extremities. No joint swelling   Assessment/Plan: 1. Functional deficits which require 3+ hours per day of interdisciplinary therapy in a comprehensive inpatient rehab setting. Physiatrist is providing close team supervision and 24 hour management of active medical problems listed below. Physiatrist and rehab team continue to assess barriers to discharge/monitor patient progress toward functional and medical goals  Care Tool:  Bathing  Bathing activity did not occur: Safety/medical concerns Body parts bathed by patient: Right arm, Left arm, Chest, Abdomen, Front perineal area, Buttocks, Right upper leg, Left upper leg, Face, Right lower leg, Left lower leg   Body parts bathed by helper: Right lower leg, Left lower leg     Bathing assist Assist Level: Set up assist     Upper Body Dressing/Undressing Upper body dressing Upper body dressing/undressing activity did not occur (including orthotics): Safety/medical  concerns What is the patient wearing?: Hospital gown only    Upper body assist Assist Level: Independent    Lower Body Dressing/Undressing Lower body dressing    Lower body dressing activity did not occur: Safety/medical concerns What is the patient wearing?: Hospital gown only     Lower body assist       Toileting Toileting    Toileting assist Assist for toileting: Independent     Transfers Chair/bed transfer  Transfers assist  Chair/bed transfer activity did not occur: Safety/medical concerns  Chair/bed transfer assist level: Contact Guard/Touching assist     Locomotion Ambulation   Ambulation assist   Ambulation  activity did not occur: Safety/medical concerns  Assist level: Contact Guard/Touching assist Assistive device: Walker-rolling Max distance: 158ft   Walk 10 feet activity   Assist     Assist level: Contact Guard/Touching assist Assistive device: Walker-rolling   Walk 50 feet activity   Assist    Assist level: Contact Guard/Touching assist Assistive device: Walker-rolling    Walk 150 feet activity   Assist Walk 150 feet activity did not occur: Safety/medical concerns  Assist level: Contact Guard/Touching assist Assistive device: Walker-rolling    Walk 10 feet on uneven surface  activity   Assist Walk 10 feet on uneven surfaces activity did not occur: Safety/medical concerns         Wheelchair     Assist Is the patient using a wheelchair?: Yes Type of Wheelchair: Manual    Wheelchair assist level: Dependent - Patient 0% Max wheelchair distance: 150    Wheelchair 50 feet with 2 turns activity    Assist        Assist Level: Dependent - Patient 0%   Wheelchair 150 feet activity     Assist      Assist Level: Dependent - Patient 0%   Blood pressure (!) 106/54, pulse 98, temperature 98.8 F (37.1 C), resp. rate 18, height 5' 5.75" (1.67 m), weight 93.9 kg, SpO2 96 %.  Medical Problem List and Plan: 1. Functional deficits due to extensive bilateral thigh wounds secondary to crush injury             -patient may not shower             -ELOS/Goals: 5-7 days             -Admit to CIR 2.  Antithrombotics: -DVT/anticoagulation:  Pharmaceutical: Other (comment) Eliquis for left femoral DVT diagnosed 06/26/21             -antiplatelet therapy: None 3. Pain: continue Tylenol as needed, Flexeril as needed for spasms, Norco as needed 4. Mood: LCSW to evaluate and provide emotional support             -antipsychotic agents: Not applicable 5. Neuropsych: This patient is capable of making decisions on his own behalf. 6. Skin/Wound Care:  Routine  skin checks.             -- Bilateral thigh wound VAC; next wound VAC change 07/29/2021 in OR also scheduled on 1/23 7. Fluids/Electrolytes/Nutrition: Routine I's and O's and follow-up chemistries 8.  Left lower extremity DVT: continue Eliquis.  Monitor for edema 9. Last Bm 2 days ago: continue anti-constipation regimen. Add magnesium gluconate 250mg  HS.  10. Tachycardia: increase propanolol 10mg  BID Vitals:   07/28/21 0448 07/28/21 0900  BP: 109/63 (!) 106/54  Pulse: 94 98  Resp: 18   Temp: 98.8 F (37.1 C)   SpO2: 96%    BP ok  11.  ABLA stable  at 9.3- serosanguinous drainage in wound vac tubing large volume on Left side output   12.  Fever without new symptoms WBC normal , contacted Plastic surgery Dr Ulice Bold who advised IV abx,most likely source is thigh wounds, now on Zosyn, tmax 99,on hydrocodone for pain  LOS: 4 days A FACE TO FACE EVALUATION WAS PERFORMED  Erick Colace 07/28/2021, 10:01 AM

## 2021-07-28 NOTE — Progress Notes (Signed)
Pt is having trouble tolerating the ProSource.  He states he "just about throws up" when he takes it.  Pt also thinks he has a Lactose intolerance and didn't want to take any milk based supplements like Ensure.  He did take a [employer] without any problems noted.  Nutrition consult entered for further options. Ayesha Mohair BSN RN [place] 07/28/2021, 11:20 PM

## 2021-07-29 ENCOUNTER — Encounter (HOSPITAL_COMMUNITY): Payer: Self-pay | Admitting: Physical Medicine & Rehabilitation

## 2021-07-29 ENCOUNTER — Inpatient Hospital Stay (HOSPITAL_COMMUNITY): Payer: PRIVATE HEALTH INSURANCE | Admitting: Certified Registered"

## 2021-07-29 ENCOUNTER — Encounter (HOSPITAL_COMMUNITY)
Admission: RE | Disposition: A | Discharge status: Home Health | Payer: Self-pay | Source: Intra-hospital | Attending: Physical Medicine & Rehabilitation

## 2021-07-29 ENCOUNTER — Ambulatory Visit (HOSPITAL_COMMUNITY)
Admission: RE | Admit: 2021-07-29 | Payer: PRIVATE HEALTH INSURANCE | Source: Home / Self Care | Admitting: Plastic Surgery

## 2021-07-29 DIAGNOSIS — D62 Acute posthemorrhagic anemia: Secondary | ICD-10-CM

## 2021-07-29 DIAGNOSIS — S71101D Unspecified open wound, right thigh, subsequent encounter: Secondary | ICD-10-CM

## 2021-07-29 DIAGNOSIS — S71102D Unspecified open wound, left thigh, subsequent encounter: Secondary | ICD-10-CM

## 2021-07-29 DIAGNOSIS — S71109S Unspecified open wound, unspecified thigh, sequela: Secondary | ICD-10-CM

## 2021-07-29 DIAGNOSIS — S71009S Unspecified open wound, unspecified hip, sequela: Secondary | ICD-10-CM

## 2021-07-29 HISTORY — PX: INCISION AND DRAINAGE OF WOUND: SHX1803

## 2021-07-29 HISTORY — PX: APPLICATION OF WOUND VAC: SHX5189

## 2021-07-29 SURGERY — IRRIGATION AND DEBRIDEMENT WOUND
Anesthesia: Monitor Anesthesia Care | Site: Thigh | Laterality: Bilateral

## 2021-07-29 MED ORDER — LACTATED RINGERS IV SOLN: INTRAVENOUS | Status: DC

## 2021-07-29 MED ORDER — 0.9 % SODIUM CHLORIDE (POUR BTL) OPTIME
TOPICAL | Status: DC | PRN
  Administered 2021-07-29: 1000 mL

## 2021-07-29 MED ORDER — BOOST / RESOURCE BREEZE PO LIQD CUSTOM
1.0000 | ORAL | Status: DC
  Administered 2021-07-29 – 2021-07-31 (×3): 1 via ORAL

## 2021-07-29 MED ORDER — CHLORHEXIDINE GLUCONATE 0.12 % MT SOLN
OROMUCOSAL | Status: AC
  Administered 2021-07-29: 15 mL via OROMUCOSAL
  Filled 2021-07-29: qty 15

## 2021-07-29 MED ORDER — CEFAZOLIN SODIUM-DEXTROSE 2-4 GM/100ML-% IV SOLN
2.0000 g | INTRAVENOUS | Status: AC
  Administered 2021-07-29: 2 g via INTRAVENOUS
  Filled 2021-07-29: qty 100

## 2021-07-29 MED ORDER — FENTANYL CITRATE (PF) 250 MCG/5ML IJ SOLN
INTRAMUSCULAR | Status: DC | PRN
  Administered 2021-07-29: 100 ug via INTRAVENOUS

## 2021-07-29 MED ORDER — CHLORHEXIDINE GLUCONATE CLOTH 2 % EX PADS
6.0000 | MEDICATED_PAD | Freq: Once | CUTANEOUS | Status: AC
  Administered 2021-07-29: 6 via TOPICAL

## 2021-07-29 MED ORDER — CHLORHEXIDINE GLUCONATE 0.12 % MT SOLN: 15.0000 mL | Freq: Once | OROMUCOSAL | Status: AC

## 2021-07-29 MED ORDER — SODIUM CHLORIDE 0.9 % IV SOLN
3.0000 g | Freq: Three times a day (TID) | INTRAVENOUS | Status: DC
  Administered 2021-07-29 – 2021-08-01 (×8): 3 g via INTRAVENOUS
  Filled 2021-07-29 (×12): qty 8

## 2021-07-29 MED ORDER — ORAL CARE MOUTH RINSE: 15.0000 mL | Freq: Once | OROMUCOSAL | Status: AC

## 2021-07-29 MED ORDER — FENTANYL CITRATE (PF) 250 MCG/5ML IJ SOLN
INTRAMUSCULAR | Status: AC
  Filled 2021-07-29: qty 5

## 2021-07-29 MED ORDER — LIDOCAINE-EPINEPHRINE 1 %-1:100000 IJ SOLN
INTRAMUSCULAR | Status: AC
  Filled 2021-07-29: qty 1

## 2021-07-29 MED ORDER — MIDAZOLAM HCL 2 MG/2ML IJ SOLN
INTRAMUSCULAR | Status: DC | PRN
Start: 2021-07-29 — End: 2021-07-29
  Administered 2021-07-29: 2 mg via INTRAVENOUS

## 2021-07-29 MED ORDER — LACTATED RINGERS IV SOLN
INTRAVENOUS | Status: DC | PRN
Start: 2021-07-29 — End: 2021-07-29

## 2021-07-29 MED ORDER — MIDAZOLAM HCL 2 MG/2ML IJ SOLN
INTRAMUSCULAR | Status: AC
  Filled 2021-07-29: qty 2

## 2021-07-29 MED ORDER — GLYCOPYRROLATE PF 0.2 MG/ML IJ SOSY
PREFILLED_SYRINGE | INTRAMUSCULAR | Status: AC
  Filled 2021-07-29: qty 1

## 2021-07-29 MED ORDER — PROPOFOL 10 MG/ML IV BOLUS
INTRAVENOUS | Status: DC | PRN
Start: 2021-07-29 — End: 2021-07-29
  Administered 2021-07-29: 50 mg via INTRAVENOUS
  Administered 2021-07-29: 30 mg via INTRAVENOUS
  Administered 2021-07-29: 50 mg via INTRAVENOUS

## 2021-07-29 MED ORDER — LIDOCAINE HCL (CARDIAC) PF 100 MG/5ML IV SOSY
PREFILLED_SYRINGE | INTRAVENOUS | Status: DC | PRN
  Administered 2021-07-29: 100 mg via INTRATRACHEAL

## 2021-07-29 MED ORDER — GLYCOPYRROLATE 0.2 MG/ML IJ SOLN
INTRAMUSCULAR | Status: DC | PRN
  Administered 2021-07-29: .2 mg via INTRAVENOUS

## 2021-07-29 SURGICAL SUPPLY — 49 items
BENZOIN TINCTURE PRP APPL 2/3 (GAUZE/BANDAGES/DRESSINGS) ×1 IMPLANT
CANISTER SUCT 3000ML PPV (MISCELLANEOUS) ×2 IMPLANT
CANISTER WOUND CARE 500ML ATS (WOUND CARE) ×2 IMPLANT
COVER SURGICAL LIGHT HANDLE (MISCELLANEOUS) ×2 IMPLANT
DRAPE DERMATAC (DRAPES) ×3 IMPLANT
DRAPE HALF SHEET 40X57 (DRAPES) ×1 IMPLANT
DRAPE IMP U-DRAPE 54X76 (DRAPES) ×1 IMPLANT
DRAPE INCISE IOBAN 66X45 STRL (DRAPES) ×1 IMPLANT
DRAPE LAPAROSCOPIC ABDOMINAL (DRAPES) IMPLANT
DRSG ADAPTIC 3X8 NADH LF (GAUZE/BANDAGES/DRESSINGS) IMPLANT
DRSG CUTIMED SORBACT 7X9 (GAUZE/BANDAGES/DRESSINGS) ×2 IMPLANT
DRSG DERMACEA 8X12 NADH (GAUZE/BANDAGES/DRESSINGS) ×1 IMPLANT
DRSG PAD ABDOMINAL 8X10 ST (GAUZE/BANDAGES/DRESSINGS) IMPLANT
DRSG VAC ATS LRG SENSATRAC (GAUZE/BANDAGES/DRESSINGS) IMPLANT
DRSG VAC ATS MED SENSATRAC (GAUZE/BANDAGES/DRESSINGS) ×2 IMPLANT
DRSG VAC ATS SM SENSATRAC (GAUZE/BANDAGES/DRESSINGS) IMPLANT
ELECT CAUTERY BLADE 6.4 (BLADE) IMPLANT
ELECT REM PT RETURN 9FT ADLT (ELECTROSURGICAL) ×2
ELECTRODE REM PT RTRN 9FT ADLT (ELECTROSURGICAL) ×1 IMPLANT
GAUZE SPONGE 4X4 12PLY STRL (GAUZE/BANDAGES/DRESSINGS) ×1 IMPLANT
GLOVE SURG ENC MOIS LTX SZ6.5 (GLOVE) ×2 IMPLANT
GLOVE SURG ENC TEXT LTX SZ6.5 (GLOVE) ×2 IMPLANT
GOWN STRL REUS W/ TWL LRG LVL3 (GOWN DISPOSABLE) ×3 IMPLANT
GOWN STRL REUS W/TWL LRG LVL3 (GOWN DISPOSABLE) ×3
GRAFT MYRIAD 3 LAYER 7X10 (Graft) ×1 IMPLANT
KIT BASIN OR (CUSTOM PROCEDURE TRAY) ×2 IMPLANT
KIT TURNOVER KIT B (KITS) ×2 IMPLANT
MICROMATRIX 1000MG (Tissue) ×2 IMPLANT
NDL HYPO 25GX1X1/2 BEV (NEEDLE) ×1 IMPLANT
NEEDLE HYPO 25GX1X1/2 BEV (NEEDLE) ×2 IMPLANT
NS IRRIG 1000ML POUR BTL (IV SOLUTION) ×2 IMPLANT
PACK GENERAL/GYN (CUSTOM PROCEDURE TRAY) ×2 IMPLANT
PACK UNIVERSAL I (CUSTOM PROCEDURE TRAY) ×2 IMPLANT
PAD ARMBOARD 7.5X6 YLW CONV (MISCELLANEOUS) ×4 IMPLANT
POWDER MYRIAD MORCELLS 1000MG (Miscellaneous) ×1 IMPLANT
SOLUTION PARTIC MCRMTRX 1000MG (Tissue) IMPLANT
STAPLER VISISTAT 35W (STAPLE) ×1 IMPLANT
SURGILUBE 2OZ TUBE FLIPTOP (MISCELLANEOUS) IMPLANT
SUT MNCRL AB 3-0 PS2 27 (SUTURE) IMPLANT
SUT MNCRL AB 4-0 PS2 18 (SUTURE) IMPLANT
SUT MON AB 2-0 CT1 36 (SUTURE) IMPLANT
SUT MON AB 5-0 PS2 18 (SUTURE) IMPLANT
SUT VIC AB 5-0 PS2 18 (SUTURE) IMPLANT
SUT VICRYL 3 0 (SUTURE) IMPLANT
SWAB COLLECTION DEVICE MRSA (MISCELLANEOUS) IMPLANT
SWAB CULTURE ESWAB REG 1ML (MISCELLANEOUS) IMPLANT
SYR CONTROL 10ML LL (SYRINGE) ×2 IMPLANT
TOWEL GREEN STERILE (TOWEL DISPOSABLE) ×2 IMPLANT
UNDERPAD 30X36 HEAVY ABSORB (UNDERPADS AND DIAPERS) ×2 IMPLANT

## 2021-07-29 NOTE — Anesthesia Postprocedure Evaluation (Signed)
Anesthesia Post Note  Patient: Wayne Hunter  Procedure(s) Performed: IRRIGATION AND DEBRIDEMENT WOUND (Bilateral: Thigh) WOUND VAC CHANGE (Bilateral: Thigh)     Patient location during evaluation: PACU Anesthesia Type: MAC Level of consciousness: awake and alert Pain management: pain level controlled Vital Signs Assessment: post-procedure vital signs reviewed and stable Respiratory status: spontaneous breathing and respiratory function stable Cardiovascular status: stable Postop Assessment: no apparent nausea or vomiting Anesthetic complications: no   No notable events documented.  Last Vitals:  Vitals:   07/29/21 1201 07/29/21 1205  BP: 105/76   Pulse:    Resp:    Temp:  (!) 36.1 C  SpO2:      Last Pain:  Vitals:   07/29/21 1150  TempSrc:   PainSc: 0-No pain                 Angelina Venard DANIEL

## 2021-07-29 NOTE — Progress Notes (Signed)
PROGRESS NOTE   Subjective/Complaints: Feels better after moving bowels this weekend. Waiting for vac change today. Progressing well with therapy.   ROS: Patient denies fever, rash, sore throat, blurred vision, nausea, vomiting, diarrhea, cough, shortness of breath or chest pain,  headache, or mood change.  Objective:   No results found. No results for input(s): WBC, HGB, HCT, PLT in the last 72 hours. No results for input(s): NA, K, CL, CO2, GLUCOSE, BUN, CREATININE, CALCIUM in the last 72 hours.  Intake/Output Summary (Last 24 hours) at 07/29/2021 1157 Last data filed at 07/29/2021 1153 Gross per 24 hour  Intake 1806.22 ml  Output 1300 ml  Net 506.22 ml        Physical Exam: Vital Signs Blood pressure 133/63, pulse 95, temperature 98.3 F (36.8 C), temperature source Oral, resp. rate 18, height 5' 7.75" (1.721 m), weight 93.9 kg, SpO2 100 %.  Constitutional: No distress . Vital signs reviewed. HEENT: NCAT, EOMI, oral membranes moist Neck: supple Cardiovascular: RRR without murmur. No JVD    Respiratory/Chest: CTA Bilaterally without wheezes or rales. Normal effort    GI/Abdomen: BS +, non-tender, non-distended Ext: no clubbing, cyanosis, sl distal LE edema Psych: pleasant and cooperative   Skin: large wounds with vac bilateral thighs vastus lateralis area, R>L Neurologic: Cranial nerves II through XII intact, motor strength is 5/5 in bilateral deltoid, bicep, tricep, grip, hip flexor, knee extensors, ankle dorsiflexor and plantar flexor Sensory exam normal sensation to light touch and proprioception in bilateral upper and lower extremities Cerebellar exam normal finger to nose to finger as well as heel to shin in bilateral upper and lower extremities Musculoskeletal: Full range of motion in all 4 extremities. No joint swelling. Hip/knee rom causes thigh pain   Assessment/Plan: 1. Functional deficits which require 3+  hours per day of interdisciplinary therapy in a comprehensive inpatient rehab setting. Physiatrist is providing close team supervision and 24 hour management of active medical problems listed below. Physiatrist and rehab team continue to assess barriers to discharge/monitor patient progress toward functional and medical goals  Care Tool:  Bathing    Body parts bathed by patient: Right arm, Left arm, Chest, Abdomen, Front perineal area, Buttocks, Right upper leg, Left upper leg, Face, Right lower leg, Left lower leg   Body parts bathed by helper: Right lower leg, Left lower leg     Bathing assist Assist Level: Set up assist     Upper Body Dressing/Undressing Upper body dressing   What is the patient wearing?: Hospital gown only    Upper body assist Assist Level: Independent    Lower Body Dressing/Undressing Lower body dressing      What is the patient wearing?: Hospital gown only     Lower body assist       Toileting Toileting    Toileting assist Assist for toileting: Independent with assistive device     Transfers Chair/bed transfer  Transfers assist     Chair/bed transfer assist level: Supervision/Verbal cueing     Locomotion Ambulation   Ambulation assist      Assist level: Contact Guard/Touching assist Assistive device: Walker-rolling Max distance: 118ft   Walk 10 feet activity  Assist     Assist level: Contact Guard/Touching assist Assistive device: Walker-rolling   Walk 50 feet activity   Assist    Assist level: Contact Guard/Touching assist Assistive device: Walker-rolling    Walk 150 feet activity   Assist Walk 150 feet activity did not occur: Safety/medical concerns  Assist level: Contact Guard/Touching assist Assistive device: Walker-rolling    Walk 10 feet on uneven surface  activity   Assist Walk 10 feet on uneven surfaces activity did not occur: Safety/medical concerns         Wheelchair     Assist Is  the patient using a wheelchair?: Yes Type of Wheelchair: Manual    Wheelchair assist level: Dependent - Patient 0% Max wheelchair distance: 150    Wheelchair 50 feet with 2 turns activity    Assist        Assist Level: Dependent - Patient 0%   Wheelchair 150 feet activity     Assist      Assist Level: Dependent - Patient 0%   Blood pressure 133/63, pulse 95, temperature 98.3 F (36.8 C), temperature source Oral, resp. rate 18, height 5' 7.75" (1.721 m), weight 93.9 kg, SpO2 100 %.  Medical Problem List and Plan: 1. Functional deficits due to extensive bilateral thigh wounds secondary to crush injury             -patient may not shower             -ELOS/Goals: 5-7 days---might be ready for dc 1/18             -Continue CIR therapies including PT, OT   -as pt might be ready for discharge soon, need to coordinate with surgery re: plan for vac changes. Could these be done in outpt surgery? 2.  Antithrombotics: -DVT/anticoagulation:  Pharmaceutical: Other (comment) Eliquis for left femoral DVT diagnosed 06/26/21             -antiplatelet therapy: None 3. Pain: continue Tylenol as needed, Flexeril as needed for spasms, Norco as needed 4. Mood: LCSW to evaluate and provide emotional support             -antipsychotic agents: Not applicable 5. Neuropsych: This patient is capable of making decisions on his own behalf. 6. Skin/Wound Care:  Routine skin checks.             -- Bilateral thigh wound VAC;   wound VAC change today1/16/2023 in OR ,also scheduled on 1/23 7. Fluids/Electrolytes/Nutrition: Routine I's and O's and follow-up chemistries 8.  Left lower extremity DVT: continue Eliquis.  Monitor for edema 9. Last Bm 2 days ago: continue anti-constipation regimen. Add magnesium gluconate 250mg  HS.  10. Tachycardia: increase propanolol 10mg  BID Vitals:   07/29/21 0355 07/29/21 1042  BP: (!) 103/59 133/63  Pulse: 81 95  Resp: 18 18  Temp: 98.3 F (36.8 C) 98.3 F (36.8  C)  SpO2: 100% 100%   BP controlled 1/16 11.  ABLA stable  at 9.3- serosanguinous drainage in wound vac tubing large volume on Left side output   12.  Fever without new symptoms WBC normal.  -Plastic surgery Dr 07/31/21   advised IV zosyn,most likely source is thigh wounds -currently afebrile  LOS: 5 days A FACE TO FACE EVALUATION WAS PERFORMED  07/31/21 07/29/2021, 11:57 AM

## 2021-07-29 NOTE — Progress Notes (Signed)
Occupational Therapy Session Note  Patient Details  Name: Wayne Hunter MRN: [id number] Date of Birth: 1995-10-31  Today's Date: 07/29/2021 OT Individual Time: 3838-1840 OT Individual Time Calculation (min): 70 min    Short Term Goals: Week 1:  OT Short Term Goal 1 (Week 1): STG = LTG 2/2 ELOS  Skilled Therapeutic Interventions/Progress Updates:    Pt received in bed with spouse present. Pt feeling better after feeling nauseated this am.  Pt was able to sit to EOB with extra time and then stood to RW with S only.  Pt ambulated to bathroom to sit on toilet with S only. Completed toileting with mod I, then completed bathing and donning new gown.  amb to sink to brush teeth. Able to stand at sink independently to complete oral care.   Returned to sit EOB for LE AROM exercises. Placed washcloths under feet for sliding knees back and forth in alt pattern.  Single leg ext with added dorsiflexion, single and then dbl leg abd/add.   Difficult for him to sit on bed due to pressure on R hip. Stretch to left to relieve hip.  Level 4 band for back strengthening. Dbl arm pulls.  Pt then needed to lie down due to hip pressure pain.   From supine, tricep ext with band and chest presses. Pt found these challenging "wow I feel so weak". Explained his pain levels are probably overriding his ability for sustained exercise. Pt resting in bed with all needs met.  Therapy Documentation Precautions:  Precautions Precautions: Fall Precaution Comments: Bil wound VACS Restrictions Weight Bearing Restrictions: Yes RLE Weight Bearing: Weight bearing as tolerated LLE Weight Bearing: Weight bearing as tolerated  Pain: Pain Assessment Pain Score: 3  Pain Type: Surgical pain Pain Location: Leg Pain Orientation: Right;Left Pain Descriptors / Indicators: Aching Pain Onset: On-going ADL: ADL Eating: Independent Grooming: Independent Where Assessed-Grooming: Standing at sink Upper Body Bathing:  Setup Lower Body Bathing: Minimal assistance Upper Body Dressing: Setup Lower Body Dressing: Not assessed Toileting: Modified independent Toilet Transfer: Close supervision Toilet Transfer Method: Insurance claims handler Equipment: Grab bars   Therapy/Group: Individual Therapy  Emalyn Schou 07/29/2021, 10:05 AM

## 2021-07-29 NOTE — Interval H&P Note (Signed)
History and Physical Interval Note:  07/29/2021 11:10 AM  Wayne Hunter  has presented today for surgery, with the diagnosis of Bilateral Thigh Wounds.  The various methods of treatment have been discussed with the patient and family. After consideration of risks, benefits and other options for treatment, the patient has consented to  Procedure(s): IRRIGATION AND DEBRIDEMENT WOUND (Bilateral) WOUND VAC CHANGE (Bilateral) as a surgical intervention.  The patient's history has been reviewed, patient examined, no change in status, stable for surgery.  I have reviewed the patient's chart and labs.  Questions were answered to the patient's satisfaction.     Alena Bills Jocsan Mcginley

## 2021-07-29 NOTE — Progress Notes (Signed)
Physical Therapy missed visit  Patient Details  Name: ANIKET PAYE MRN: [id number] Date of Birth: 1995-10-10  Today's Date: 07/29/2021  Short Term Goals: Week 1:  PT Short Term Goal 1 (Week 1): =LTGs d/t ELOS Week 2:     Skilled Therapeutic Interventions/Progress Updates:    ATTEMPTED X 2 FIRST ATTEMPT PT JUST RETURNING FROM OR SECOND ATTEMPT PT DECLINED D/T PAIN FROM OR PROCEDURE  Therapy Documentation Precautions:  Precautions Precautions: Fall Precaution Comments: Bil wound VACS Restrictions Weight Bearing Restrictions: Yes RLE Weight Bearing: Weight bearing as tolerated LLE Weight Bearing: Weight bearing as tolerated   Therapy/Group: Individual Therapy Rada Hay, PT   Shearon Balo 07/29/2021, 8:57 AM

## 2021-07-29 NOTE — Anesthesia Preprocedure Evaluation (Addendum)
Anesthesia Evaluation  Patient identified by MRN, date of birth, ID band Patient awake    Reviewed: Allergy & Precautions, NPO status , Patient's Chart, lab work & pertinent test results  History of Anesthesia Complications Negative for: history of anesthetic complications  Airway Mallampati: II  TM Distance: >3 FB Neck ROM: Full    Dental  (+) Teeth Intact, Dental Advisory Given   Pulmonary neg pulmonary ROS,    Pulmonary exam normal        Cardiovascular + DVT (eliquis)  Normal cardiovascular exam     Neuro/Psych negative neurological ROS  negative psych ROS   GI/Hepatic negative GI ROS, Neg liver ROS,   Endo/Other  negative endocrine ROS  Renal/GU negative Renal ROS  negative genitourinary   Musculoskeletal negative musculoskeletal ROS (+)   Abdominal   Peds  Hematology  (+) Blood dyscrasia, anemia , Hb 8.7   Anesthesia Other Findings Run over by a lawn equipment tractor at the end of November- b/l LE crush injuries  Reproductive/Obstetrics negative OB ROS                            Anesthesia Physical  Anesthesia Plan  ASA: 2  Anesthesia Plan: MAC   Post-op Pain Management: Tylenol PO (pre-op), Dilaudid IV and Ketamine IV   Induction: Intravenous  PONV Risk Score and Plan: 1 and Ondansetron, Dexamethasone, Midazolam and Treatment may vary due to age or medical condition  Airway Management Planned: Natural Airway and Simple Face Mask  Additional Equipment: None  Intra-op Plan:   Post-operative Plan:   Informed Consent: I have reviewed the patients History and Physical, chart, labs and discussed the procedure including the risks, benefits and alternatives for the proposed anesthesia with the patient or authorized representative who has indicated his/her understanding and acceptance.     Dental advisory given  Plan Discussed with: CRNA and  Anesthesiologist  Anesthesia Plan Comments:       Anesthesia Quick Evaluation

## 2021-07-29 NOTE — Progress Notes (Signed)
Initial Nutrition Assessment  DOCUMENTATION CODES:   Obesity unspecified  INTERVENTION:  - Encourage adequate PO intake - Discontinue ProSource TID - Double protein portions with meals - 1 packet Juven BID, each packet provides 95 calories, 2.5 grams of protein (collagen), and 9.8 grams of carbohydrate (3 grams sugar); also contains 7 grams of L-arginine and L-glutamine, 300 mg vitamin C, 15 mg vitamin E, 1.2 mcg vitamin B-12, 9.5 mg zinc, 200 mg calcium, and 1.5 g  Calcium Beta-hydroxy-Beta-methylbutyrate to support wound healing - Boost Breeze po once daily, each supplement provides 250 kcal and 9 grams of protein - Continue MVI with minerals, Vitamin C and zinc daily  NUTRITION DIAGNOSIS:   Increased nutrient needs related to wound healing as evidenced by estimated needs.  GOAL:   Patient will meet greater than or equal to 90% of their needs  MONITOR:   PO intake, Supplement acceptance, Labs, Weight trends, Skin, I & O's  REASON FOR ASSESSMENT:   Consult Assessment of nutrition requirement/status  ASSESSMENT:   Pt admitted to CIR with functional deficits after hospital admission 1/3 for debridement of bilateral thigh wounds secondary to crush injury 06/04/21, now with bilateral thigh wound VACs.  Planned wound VAC change 1/16 and 1/23.  Pt out of room for bilateral wound debridement. Spoke with pt's wife regarding pt's diet history. She states that he has been eating 100% of his meals and denies any loss of appetite. Pt has been taking ProSource but does not enjoy it and wanted to try a different supplement. Pt previously given Ensure but noted to not tolerate it well. He was provided [employer] and enjoyed these and would like to continue to receive them during admission.  Current weight: 93.9 kg   Medications: Vitamin C, ferrous sulfate, magonate, MVI, senna, zinc, IV abx  Labs reviewed  UOP: 1.6L x24 hours L wound VAC output: 110mL x12 hours R wound VAC output:  137mL x12 hours  NUTRITION - FOCUSED PHYSICAL EXAM: Deferred to follow up  Diet Order:   Diet Order             Diet NPO time specified  Diet effective now                   EDUCATION NEEDS:   Education needs have been addressed  Skin:  Skin Assessment: Skin Integrity Issues: Skin Integrity Issues:: Incisions Incisions: L/R thigh  Last BM:  07/28/21 type 7  Height:   Ht Readings from Last 1 Encounters:  07/29/21 5' 7.75" (1.721 m)    Weight:   Wt Readings from Last 1 Encounters:  07/29/21 93.9 kg    Ideal Body Weight:  64.5 kg  BMI:  Body mass index is 31.71 kg/m.  Estimated Nutritional Needs:   Kcal:  2400-2600  Protein:  140-160g  Fluid:  >/=2.4L  Clayborne Dana, RDN, LDN Clinical Nutrition

## 2021-07-29 NOTE — Op Note (Signed)
DATE OF OPERATION: 07/29/2021  LOCATION: [facility]  PREOPERATIVE DIAGNOSIS: bilateral thigh wounds  POSTOPERATIVE DIAGNOSIS: Same  PROCEDURE:  Right thigh:  Excision of wound skin and fat 2 x 4 cm 2.   Right thigh: Placement of Acell powder 1 gm and VAC 3.   Left thigh:  excision of wound 2 x 6 cm skin and fat 4.   Left thigh: placement of Myriad powder 1 gm and 7 x 10 cm sheet and VAC  SURGEON: Jelisha Weed H. J. [employer], DO  ASSISTANT: Roetta Sessions, PA  EBL: 2 cc  CONDITION: Stable  COMPLICATIONS: None  INDICATION: The patient, Wayne Hunter, is a 26 y.o. male born on 17-Jun-1996, is here for treatment of bilateral thigh wounds as a staged procedure  PROCEDURE DETAILS:  The patient was seen prior to surgery and marked.  The IV antibiotics were given. The patient was taken to the operating room and given sedation. A standard time out was performed and all information was confirmed by those in the room.   The VAC sponges were removed from both eyes.  The patient was prepped and draped.  Right thigh: The superior border of the wound had some nonviable fat and skin present and this was excised with the #10 blade and a pair of scissors for an area of 2 x 4 cm.  All of the ACell powder was then placed in the defect and a sore back applied.  The VAC was placed with K-Y jelly and there was an excellent seal.  Left thigh: The superior border of the wound had nonviable fat and skin present.  This was excised with a pair of tissue scissors for an area of 2 x 6 cm.  All of the myriad powder and 7 x 10 sheet was used on the lateral portion of the wound.  All of the myriad had incorporated in that area.  Sore VAC was applied with the VAC and there was an excellent seal obtained.  The patient was allowed to wake up and taken to recovery room in stable condition at the end of the case. The family was notified at the end of the case.   The advanced practice practitioner  (APP) assisted throughout the case.  The APP was essential in retraction and counter traction when needed to make the case progress smoothly.  This retraction and assistance made it possible to see the tissue plans for the procedure.  The assistance was needed for blood control, tissue re-approximation and assisted with closure of the incision site.

## 2021-07-29 NOTE — Transfer of Care (Signed)
Immediate Anesthesia Transfer of Care Note  Patient: Wayne Hunter  Procedure(s) Performed: IRRIGATION AND DEBRIDEMENT WOUND (Bilateral: Thigh) WOUND VAC CHANGE (Bilateral: Thigh)  Patient Location: PACU  Anesthesia Type:MAC  Level of Consciousness: awake, alert , oriented and patient cooperative  Airway & Oxygen Therapy: Patient Spontanous Breathing  Post-op Assessment: Report given to RN, Post -op Vital signs reviewed and stable and Patient moving all extremities X 4  Post vital signs: Reviewed and stable  Last Vitals:  Vitals Value Taken Time  BP 103/68 07/29/21 1152  Temp    Pulse 86 07/29/21 1154  Resp 11 07/29/21 1154  SpO2 100 % 07/29/21 1154  Vitals shown include unvalidated device data.  Last Pain:  Vitals:   07/29/21 1042  TempSrc: Oral  PainSc:       Patients Stated Pain Goal: 0 (21/03/12 8118)  Complications: No notable events documented.

## 2021-07-29 NOTE — Progress Notes (Signed)
Physical Therapy Session Note  Patient Details  Name: Wayne Hunter MRN: [id number] Date of Birth: Sep 11, 1995  Today's Date: 07/29/2021 PT Individual Time: 1003-1013 PT Individual Time Calculation (min): 10 min  and Today's Date: 07/29/2021 PT Missed Time: 50 Minutes Missed Time Reason: Wound care  Short Term Goals: Week 1:  PT Short Term Goal 1 (Week 1): =LTGs d/t ELOS  Skilled Therapeutic Interventions/Progress Updates:     Pt received supine in bed and agrees to therapy. Reports pain in bilateral thighs but states he recently received pain medicine. PT provides education and rest breaks to manage pain. Pt performs supine to sit slowly with verbal cues for sequencing and positioning at EOB. PT positions wound vacs to allow for freedom of movement. Pt preparing to perform bed to chair transfer when NT comes in room and states transport is here to take pt to OR. Pt misses 50 minutes of skilled PT due to going to OR for wound vac changes.  Therapy Documentation Precautions:  Precautions Precautions: Fall Precaution Comments: Bil wound VACS Restrictions Weight Bearing Restrictions: Yes RLE Weight Bearing: Weight bearing as tolerated LLE Weight Bearing: Weight bearing as tolerated General: PT Amount of Missed Time (min): 50 Minutes PT Missed Treatment Reason: Wound care   Therapy/Group: Individual Therapy  Beau Fanny, PT, DPT 07/29/2021, 3:23 PM

## 2021-07-30 ENCOUNTER — Encounter (HOSPITAL_COMMUNITY): Payer: Self-pay | Admitting: Plastic Surgery

## 2021-07-30 LAB — CULTURE, BLOOD (ROUTINE X 2)
Culture: NO GROWTH
Culture: NO GROWTH
Special Requests: ADEQUATE

## 2021-07-30 MED ORDER — OXYCODONE HCL 5 MG PO TABS
5.0000 mg | ORAL_TABLET | ORAL | Status: DC | PRN
  Administered 2021-07-30 – 2021-07-31 (×2): 5 mg via ORAL
  Filled 2021-07-30 (×2): qty 1

## 2021-07-30 MED ORDER — OXYCODONE HCL 5 MG PO TABS: 5.0000 mg | ORAL_TABLET | ORAL | Status: DC | PRN

## 2021-07-30 MED ORDER — OXYCODONE HCL 5 MG PO TABS
10.0000 mg | ORAL_TABLET | ORAL | Status: DC | PRN
  Administered 2021-07-30 – 2021-08-01 (×4): 10 mg via ORAL
  Filled 2021-07-30 (×4): qty 2

## 2021-07-30 NOTE — Progress Notes (Addendum)
PROGRESS NOTE   Subjective/Complaints: Pt in good spirits today. Denied significant pain. (Although after I left an hour later, his pain levels rose substantially with PT  ROS: Patient denies fever, rash, sore throat, blurred vision, nausea, vomiting, diarrhea, cough, shortness of breath or chest pain, headache, or mood change.   Objective:   No results found. No results for input(s): WBC, HGB, HCT, PLT in the last 72 hours. No results for input(s): NA, K, CL, CO2, GLUCOSE, BUN, CREATININE, CALCIUM in the last 72 hours.  Intake/Output Summary (Last 24 hours) at 07/30/2021 1014 Last data filed at 07/30/2021 0833 Gross per 24 hour  Intake 1138.1 ml  Output 1150 ml  Net -11.9 ml        Physical Exam: Vital Signs Blood pressure 105/65, pulse (!) 101, temperature 98.7 F (37.1 C), temperature source Oral, resp. rate 18, height 5' 7.75" (1.721 m), weight 93.9 kg, SpO2 98 %.  Constitutional: No distress . Vital signs reviewed. HEENT: NCAT, EOMI, oral membranes moist Neck: supple Cardiovascular: RRR without murmur. No JVD    Respiratory/Chest: CTA Bilaterally without wheezes or rales. Normal effort    GI/Abdomen: BS +, non-tender, non-distended Ext: no clubbing, cyanosis, or edema Psych: pleasant and cooperative  Skin: large wounds with vac bilateral thighs vastus lateralis area, R>L wounds Neurologic: Cranial nerves II through XII intact, motor strength is 5/5 in bilateral deltoid, bicep, tricep, grip, hip flexor, knee extensors, ankle dorsiflexor and plantar flexor Sensory exam normal sensation to light touch and proprioception in bilateral upper and lower extremities Cerebellar exam normal finger to nose to finger as well as heel to shin in bilateral upper and lower extremities Musculoskeletal: Full range of motion in all 4 extremities. No joint swelling. Hip/knee rom causes thigh pain once again   Assessment/Plan: 1.  Functional deficits which require 3+ hours per day of interdisciplinary therapy in a comprehensive inpatient rehab setting. Physiatrist is providing close team supervision and 24 hour management of active medical problems listed below. Physiatrist and rehab team continue to assess barriers to discharge/monitor patient progress toward functional and medical goals  Care Tool:  Bathing    Body parts bathed by patient: Right arm, Left arm, Chest, Abdomen, Front perineal area, Buttocks, Right upper leg, Left upper leg, Face, Right lower leg, Left lower leg   Body parts bathed by helper: Right lower leg, Left lower leg     Bathing assist Assist Level: Set up assist     Upper Body Dressing/Undressing Upper body dressing   What is the patient wearing?: Hospital gown only    Upper body assist Assist Level: Set up assist    Lower Body Dressing/Undressing Lower body dressing      What is the patient wearing?: Hospital gown only     Lower body assist       Toileting Toileting    Toileting assist Assist for toileting: Independent with assistive device     Transfers Chair/bed transfer  Transfers assist     Chair/bed transfer assist level: Supervision/Verbal cueing     Locomotion Ambulation   Ambulation assist      Assist level: Supervision/Verbal cueing Assistive device: Walker-rolling Max distance: 129ft  Walk 10 feet activity   Assist     Assist level: Supervision/Verbal cueing Assistive device: Walker-rolling   Walk 50 feet activity   Assist    Assist level: Supervision/Verbal cueing Assistive device: Walker-rolling    Walk 150 feet activity   Assist Walk 150 feet activity did not occur: Safety/medical concerns  Assist level: Contact Guard/Touching assist Assistive device: Walker-rolling    Walk 10 feet on uneven surface  activity   Assist Walk 10 feet on uneven surfaces activity did not occur: Safety/medical concerns          Wheelchair     Assist Is the patient using a wheelchair?: Yes Type of Wheelchair: Manual    Wheelchair assist level: Dependent - Patient 0% Max wheelchair distance: 150    Wheelchair 50 feet with 2 turns activity    Assist        Assist Level: Dependent - Patient 0%   Wheelchair 150 feet activity     Assist      Assist Level: Dependent - Patient 0%   Blood pressure 105/65, pulse (!) 101, temperature 98.7 F (37.1 C), temperature source Oral, resp. rate 18, height 5' 7.75" (1.721 m), weight 93.9 kg, SpO2 98 %.  Medical Problem List and Plan: 1. Functional deficits due to extensive bilateral thigh wounds secondary to crush injury             -patient may not shower             -ELOS/Goals: 5-7 days-              -Continue CIR therapies including PT and OT. Interdisciplinary team conference today to discuss goals, barriers to discharge, and dc planning.   -need to discuss plan with plastics as he's near rehab goals.  2.  Antithrombotics: -DVT/anticoagulation:  Pharmaceutical: Other (comment) Eliquis for left femoral DVT diagnosed 06/26/21             -antiplatelet therapy: None 3. Pain: continue Tylenol as needed, Flexeril as needed for spasms, Norco as needed  1/17 will change norco to oxycodone to see if his pain is better controlled 4. Mood: LCSW to evaluate and provide emotional support             -antipsychotic agents: Not applicable 5. Neuropsych: This patient is capable of making decisions on his own behalf. 6. Skin/Wound Care:  Routine skin checks.             -- Bilateral thigh wound VAC;   wound VAC change  07/29/2021 in OR ,also scheduled on 1/23 7. Fluids/Electrolytes/Nutrition: Routine I's and O's and follow-up chemistries 8.  Left lower extremity DVT: continue Eliquis.  Monitor for edema 9. Last Bm 2 days ago: continue anti-constipation regimen. Add magnesium gluconate 250mg  HS.  10. Tachycardia: increase propanolol 10mg  BID Vitals:   07/29/21  1951 07/30/21 0449  BP: (!) 114/58 105/65  Pulse: 94 (!) 101  Resp: 18 18  Temp: 98.9 F (37.2 C) 98.7 F (37.1 C)  SpO2: 98% 98%   BP controlled 1/17 11.  ABLA stable  at 9.3- serosanguinous drainage in wound vac tubing large volume on Left side output   12.  Fever without new symptoms WBC normal.  -plastics changed him to unasyn 1/16 -currently afebrile   LOS: 6 days A FACE TO FACE EVALUATION WAS PERFORMED  2/17 07/30/2021, 10:14 AM

## 2021-07-30 NOTE — Progress Notes (Signed)
Physical Therapy Session Note  Patient Details  Name: Wayne Hunter MRN: [id number] Date of Birth: 04-Jun-1996  Today's Date: 07/30/2021 PT Individual Time: 3762-8315 PT Individual Time Calculation (min): 30 min   Today's Date: 07/30/2021 PT Missed Time: 15 Minutes Missed Time Reason: Pain  Short Term Goals: Week 1:  PT Short Term Goal 1 (Week 1): =LTGs d/t ELOS  Skilled Therapeutic Interventions/Progress Updates:   Received pt sitting in recliner with wife present at bedside. Pt agreeable to attempt PT and reported pain 5/10 in R lateral thigh initially, increasing to intense 10/10 during session. Repositioning, rest breaks, and distraction done to reduce pain levels and RN present to administer medications and connect pt to IV.  Session with emphasis on functional mobility and gait training. Donned second gown with mod A and pt performed all transfers with RW and supervision. Pt ambulated 188ft with RW and supervision with +2 assist from wife and therapist to manage 2 wound vacs and IV pole. Pt ambulated with antalgic gait pattern, decreased bilateral foot clearance, downward gaze, and with decreased cadence due to pain. Pt reports pain in R thigh intensified after "RN touched dressing" and ultimately requested to return to bed. Sit<>supine with supervision and increased time due to pain. Wound vac beeping with "leak" - RN called to bedside and reset wound vac seal, however immediately pt began screaming and crying in pain due to severe hypersensitivity and pressure from seal - MD notified for request for additional pain medications. Concluded session with pt semi-reclined in bed with all needs within reach and wife present at bedside. 15 minutes missed of skilled physical therapy due to excruciating pain and inability to tolerate any further activity.  Therapy Documentation Precautions:  Precautions Precautions: Fall Precaution Comments: Bil wound VACS Restrictions Weight Bearing  Restrictions: Yes RLE Weight Bearing: Weight bearing as tolerated LLE Weight Bearing: Weight bearing as tolerated  Therapy/Group: Individual Therapy Martin Majestic PT, DPT   07/30/2021, 8:32 AM

## 2021-07-30 NOTE — Progress Notes (Addendum)
Call placed to Dr. Kittie Plater office regarding follow-up for next [facility] change in OR. Patient to be discharged this week from rehab and this procedure is scheduled for next Monday. Keenan Bachelor, PA-C will follow-up with patient tomorrow. Patient's has both home VAC pumps, canisters and extra drape in his room. Need to clarify antibiotic therapy as outpatient. (Augmentin was prescribed to [facility] pharmacy at time of discharge from acute side.)

## 2021-07-30 NOTE — Discharge Instructions (Addendum)
Inpatient Rehab Discharge Instructions  Wayne Hunter Discharge date and time: 08/01/2021  Activities/Precautions/ Functional Status:  Activity: no lifting, driving, or strenuous exercise for until cleared by MD Diet: regular diet Wound Care: reinforce dressing PRN  Functional status:  ___ No restrictions     ___ Walk up steps independently ___ 24/7 supervision/assistance   ___ Walk up steps with assistance ___ Intermittent supervision/assistance  ___ Bathe/dress independently ___ Walk with walker     __x_ Bathe/dress with assistance ___ Walk Independently    ___ Shower independently ___ Walk with assistance    ___ Shower with assistance __x_ No alcohol     ___ Return to work/school ________  Special Instructions:  Nothing to eat or drink after midnight on Sunday, 08/04/2021 for surgery on Monday  No driving, alcohol consumption or tobacco use.    COMMUNITY REFERRALS UPON DISCHARGE:    Home Health:   PT & OT                 Agency: [facility]  [employer] -[phone] [employer]  Medical Equipment/Items Ordered: 3 IN 1 AND ROLLING WALKER                                                 Agency/Supplier:[employer] ORDERED FOR PATIENT AND SENT TO HOME  [employer] CASE MANAGER-[id number] ([place] CASE MANAGER)    My questions have been answered and I understand these instructions. I will adhere to these goals and the provided educational materials after my discharge from the hospital.  Patient/Caregiver Signature _______________________________ Date __________  Clinician Signature _______________________________________ Date __________  Please bring this form and your medication list with you to all your follow-up doctor's appointments.    Information on my medicine - ELIQUIS (apixaban)  This medication education was reviewed with me or my healthcare representative as part of my discharge preparation.  The  pharmacist that spoke with me during my hospital stay was:   Why was Eliquis prescribed for you? Eliquis was prescribed to treat blood clots that may have been found in the veins of your legs (deep vein thrombosis) or in your lungs (pulmonary embolism) and to reduce the risk of them occurring again.  What do You need to know about Eliquis ? Continue Eliquis 5 mg tablet taken TWICE daily.  Eliquis may be taken with or without food.   Try to take the dose about the same time in the morning and in the evening. If you have difficulty swallowing the tablet whole please discuss with your pharmacist how to take the medication safely.  Take Eliquis exactly as prescribed and DO NOT stop taking Eliquis without talking to the doctor who prescribed the medication.  Stopping may increase your risk of developing a new blood clot.  Refill your prescription before you run out.  After discharge, you should have regular check-up appointments with your healthcare provider that is prescribing your Eliquis.    What do you do if you miss a dose? If a dose of ELIQUIS is not taken at the scheduled time, take it as soon as possible on the same day and twice-daily administration should be resumed. The dose should not be doubled to make up for a missed dose.  Important Safety Information A possible side effect of Eliquis is bleeding. You should call your healthcare provider  right away if you experience any of the following: Bleeding from an injury or your nose that does not stop. Unusual colored urine (red or dark brown) or unusual colored stools (red or black). Unusual bruising for unknown reasons. A serious fall or if you hit your head (even if there is no bleeding).  Some medicines may interact with Eliquis and might increase your risk of bleeding or clotting while on Eliquis. To help avoid this, consult your healthcare provider or pharmacist prior to using any new prescription or non-prescription  medications, including herbals, vitamins, non-steroidal anti-inflammatory drugs (NSAIDs) and supplements.  This website has more information on Eliquis (apixaban): http://www.eliquis.com/eliquis/home

## 2021-07-30 NOTE — Progress Notes (Signed)
Occupational Therapy Session Note  Patient Details  Name: Wayne Hunter MRN: [id number] Date of Birth: 1996-03-06  Today's Date: 07/30/2021 OT Missed Time: 45 Minutes Missed Time Reason: Pain   Short Term Goals: Week 1:  OT Short Term Goal 1 (Week 1): STG = LTG 2/2 ELOS  Skilled Therapeutic Interventions/Progress Updates:    Pt received semi-reclined in bed with wife present. Wife reports no concerns going home. Briefly discussed nonpharm pain management methods. Ongoing c/o pain in site of B thigh wound vacs, reports RN to return with pain meds. Pt suddenly crying out due to pain and unable to focus on conversation. RN to folllow up. Pt missed 45 min of OT due to pain.    Therapy Documentation Precautions:  Precautions Precautions: Fall Precaution Comments: Bil wound VACS Restrictions Weight Bearing Restrictions: Yes RLE Weight Bearing: Weight bearing as tolerated LLE Weight Bearing: Weight bearing as tolerated  Pain:  Did not rate, see session note ADL: See Care Tool for more details.   Therapy/Group: Individual Therapy  Claudie Revering MS, OTR/L  07/30/2021, 6:55 AM

## 2021-07-30 NOTE — Progress Notes (Signed)
Occupational Therapy Session Note  Patient Details  Name: Wayne Hunter MRN: [id number] Date of Birth: 02-24-1996  Today's Date: 07/30/2021 OT Individual Time: 7846-9629 OT Individual Time Calculation (min): 43 min    Short Term Goals: Week 1:  OT Short Term Goal 1 (Week 1): STG = LTG 2/2 ELOS  Skilled Therapeutic Interventions/Progress Updates:  Pt greeted  supine in bed agreeable to OT intervention. Session focus on BADL reeducation, functional mobility and decreasing overall caregiver burden. Pt completed supine>sit with supervision with increaesed time and effort 2/2 increased pain. Pt preferred to transition into long sitting and then maneuver BLEs to EOB however transitional movements created the most pain this session, likely d/t wound vac change yesterday.  Pt completed sit<>stand from EOB with rw with supervision, stand pivot transfer from EOB>w/c however pt unable to tolerate sitting in w/c d/t pain/ increased pressure/ sensation in RLE >LLE even with roho air cushion provided. Stand pivot back to EOB with Rw and supervision. Pt was able to ambulate to toilet with supervision needing assistance only to manage wound vacs. Pt completed 3/3 toileting tasks MOD I. Pt completed UB dressing from toilet with set- up assist. Pt dependent to don socks d/t pain, may benefit from LB AE education? Pt completed functional mobility from toilet >recliner with rw and supervision. Pt able to tolerate sitting in recliner with R hip slide more forward in chair for pain mgmt.    pt left seated in recliner with wife present and all needs within reach.                     Therapy Documentation Precautions:  Precautions Precautions: Fall Precaution Comments: Bil wound VACS Restrictions Weight Bearing Restrictions: Yes RLE Weight Bearing: Weight bearing as tolerated LLE Weight Bearing: Weight bearing as tolerated  Pain: 6/10 pain in BLEs ( RLE>LLE), deep breathing strategies, rest breaks and  distraction used as pain mgmt strategies.     Therapy/Group: Individual Therapy  Pollyann Glen [facility] 07/30/2021, 8:53 AM

## 2021-07-30 NOTE — Patient Care Conference (Signed)
Inpatient RehabilitationTeam Conference and Plan of Care Update Date: 07/30/2021   Time: 12:13 PM    Patient Name: Wayne Hunter      Medical Record Number: [id number]  Date of Birth: 04/13/1996 Sex: Male         Room/Bed: [id number] Payor Info: Payor: [employer] / Plan: [employer] / Product Type: *No Product type* /    Admit Date/Time:  07/24/2021  6:57 PM  Primary Diagnosis:  [facility] Problems: Principal Problem:   Debility    Expected Discharge Date: Expected Discharge Date: 08/01/21  Team Members Present: Physician leading conference: Dr. Alger Simons Social Worker Present: Ovidio Kin, LCSW Nurse Present: Dorien Chihuahua, RN PT Present: Becky Sax, PT OT Present: Meriel Pica, OT PPS Coordinator present : Gunnar Fusi, SLP     Current Status/Progress Goal Weekly Team Focus  Bowel/Bladder     Cont of bowel and bladder        Swallow/Nutrition/ Hydration             ADL's   supervision with transfers to bathroom with RW, set up bathing and dressing, mod I toileting  s/u dressing and bathing; mod I toileting, toilet transfers, standing with RW  ADL training, functional mobility, pt education   Mobility   CGA overall, gait up to 147ft with RW  Mod I  Transfers, pt/fam edu, gait training, endurance, D/C planning   Communication             Safety/Cognition/ Behavioral Observations            Pain     Pain controlled until today with prns; wound vac disconnected and excruciating pain with replacement   Pain at or below level 4 with prn medications   Monitor need for and effectiveness of medications  Skin     Wound vacs for home;    Wounds managed and healing   OR debridement weekly at present; wound vac replacement    Discharge Planning:  HOme with wife whois able to provide 24/7 care, pain issues and now will require IV antibiotics. Wound vacs in room for home.   Team Discussion: Patient was doing fairly well overall;  moving as well as possible given double wound vacs and weekly debridements. IV abx vic PICC continued.  Patient on target to meet rehab goals: yes, currently supervision overall. Able to ambulate up to 100'. Ready for discharge pending plastics plan  *See Care Plan and progress notes for long and short-term goals.   Revisions to Treatment Plan:  Weekly debridement and wound vac replacement   Teaching Needs: Safety, skin care, vac care, medications, etc.  Current Barriers to Discharge: Decreased caregiver support and Wound care  Possible Resolutions to Barriers: Family education Wound vac for home supplies at bedside DME: BSC,  and RW recommended     Medical Summary Current Status: bilateral degloving injuries to thighs. s/p i&D, wound vacs with changes in OR.  Barriers to Discharge: Medical stability;Behavior   Possible Resolutions to [employer]: daily assessment of wounds and pain control   Continued Need for Acute Rehabilitation Level of Care: The patient requires daily medical management by a physician with specialized training in physical medicine and rehabilitation for the following reasons: Direction of a multidisciplinary physical rehabilitation program to maximize functional independence : Yes Medical management of patient stability for increased activity during participation in an intensive rehabilitation regime.: Yes Analysis of laboratory values and/or radiology reports with any subsequent need for medication adjustment  and/or medical intervention. : Yes   I attest that I was present, lead the team conference, and concur with the assessment and plan of the team.   Dorien Chihuahua B 07/30/2021, 4:44 PM

## 2021-07-30 NOTE — Progress Notes (Signed)
Patient ID: Wayne Hunter, male   DOB: 09/21/1995, 26 y.o.   MRN: [id number] Updated [employer]acey[employer]Work[employer] CM regarding team conference and target discharge ate of 1/19. Will fax her notes and she will await MD recommendations for IV antibiotics and readiness for discharge. Pt having much pain today due to in OR yesterday. Will update Workers Comp CM when know more information

## 2021-07-30 NOTE — Progress Notes (Signed)
Occupational Therapy Session Note  Patient Details  Name: Wayne Hunter MRN: [id number] Date of Birth: Oct 28, 1995  Today's Date: 07/30/2021 OT Individual Time: 1405-1500 OT Individual Time Calculation (min): 55 min    Short Term Goals: Week 1:  OT Short Term Goal 1 (Week 1): STG = LTG 2/2 ELOS  Skilled Therapeutic Interventions/Progress Updates:  Skilled OT intervention completed with focus on functional transfers, toileting, activity tolerance. Pt received upright in bed with wife and LPN present- LPN administering meds. Pt reporting need to use bathroom, with increased time needed for upright to EOB bed mobility however with supervision assist. Pt sit > stand with supervision using RW then short ambulatory transfer <> toilet using RW and supervision, with most assist provided for managing both wounds vacs to prevent entanglement. Pt requiring min A to help descend stand > sit on toilet for comfort of wound areas. Pt doffed/donned new gown with set up. Pt completed toileting with supervision. Sitting EOB, pt participated in BUE strengthening to promote endurance needed for transfers and self-care, with green band including the following:  Shoulder horizontal abduction 2x10 Shoulder flexion x10 each arm Shoulder external rotation x10 Alternating chest presses 2x10  Cues needed for form and position, however completed with supervision with good form once corrected technique. Issued pt a HEP handout for exercises performed this session, however suggested to pt that he wait to complete the exercises on his own until finished being instructed on all exercises and all with proper form. Pt completed bed mobility with mod I. Pt left upright in bed, with bed alarm on and all needs in reach at end of session.     Therapy Documentation Precautions:  Precautions Precautions: Fall Precaution Comments: Bil wound VACS Restrictions Weight Bearing Restrictions: Yes RLE Weight Bearing: Weight  bearing as tolerated LLE Weight Bearing: Weight bearing as tolerated  Pain: Unrated pain in R leg, LPN in room providing meds   Therapy/Group: Individual Therapy  Kevon Tench E Sarely Stracener 07/30/2021, 7:52 AM

## 2021-07-30 NOTE — Progress Notes (Signed)
Pharmacy Antibiotic Note  Wayne Hunter is a 26 y.o. male admitted on 07/24/2021 with  wound infection .  Pharmacy has been consulted for Zosyn dosing.   ID: BL wound vacs for thigh wounds after industrial accident.  Cultures remained neg. Abx was changed to Unasyn. Scr stable. VAC changed 1/16 and plan for 1/23. D5 total abx  1/12: BC x 2>> NGTD 1/12: UCx>> 20K pseudomonas (pan sens)   1/13: Zosyn>>1/16 1/16 unasyn>>  Plan: Unasyn 3g IV q8    Height: 5' 7.75" (172.1 cm) Weight: 93.9 kg (207 lb 0.2 oz) IBW/kg (Calculated) : 67.83  Temp (24hrs), Avg:98 F (36.7 C), Min:97 F (36.1 C), Max:98.9 F (37.2 C)  Recent Labs  Lab 07/25/21 0512 07/25/21 2015 07/26/21 0457  WBC 7.6  --  10.1  CREATININE 0.75 0.75  --      Estimated Creatinine Clearance: 158.8 mL/min (by C-G formula based on SCr of 0.75 mg/dL).    Allergies  Allergen Reactions   Bee Venom Swelling     Ulyses Southward, PharmD, BCIDP, AAHIVP, CPP Infectious Disease Pharmacist 07/30/2021 10:31 AM

## 2021-07-31 ENCOUNTER — Telehealth: Payer: Self-pay | Admitting: Plastic Surgery

## 2021-07-31 ENCOUNTER — Other Ambulatory Visit (HOSPITAL_COMMUNITY): Payer: Self-pay

## 2021-07-31 NOTE — Progress Notes (Signed)
Physical Therapy Session Note  Patient Details  Name: Wayne Hunter MRN: [id number] Date of Birth: 29-Jun-1996  Today's Date: 07/31/2021 PT Individual Time: 0935-1000 PT Individual Time Calculation (min): 25 min   Short Term Goals: Week 1:  PT Short Term Goal 1 (Week 1): =LTGs d/t ELOS  Skilled Therapeutic Interventions/Progress Updates:   Pt received supine in bed and agreeable to PT at bed level. PT instructed pt in supine therex. SAQ, hip abduction, SLR, ankle DF/PF. Each performed x 10 with intermittently AAROM due to pain at injury site on BLE as well as prolonged rest breaks due to fatigue intermittently. Left supine in bed with call bell in reach and all needs met.       Therapy Documentation Precautions:  Precautions Precautions: Fall Precaution Comments: Bil wound VACS Restrictions Weight Bearing Restrictions: Yes RLE Weight Bearing: Weight bearing as tolerated LLE Weight Bearing: Weight bearing as tolerated    Vital Signs: Therapy Vitals Temp: 98 F (36.7 C) Pulse Rate: 86 Resp: 18 BP: (!) 107/58 Patient Position (if appropriate): Lying Oxygen Therapy SpO2: 100 % O2 Device: Room Air Pain: Pain Assessment Pain Scale: 0-10 Pain Score: 5  Pain Type: Acute pain Pain Location: Hip Pain Orientation: Right Pain Descriptors / Indicators: Throbbing Pain Onset: On-going Patients Stated Pain Goal: 2 Pain Intervention(s): Medication (See eMAR)    Therapy/Group: Individual Therapy  Lorie Phenix 07/31/2021, 12:58 PM

## 2021-07-31 NOTE — Progress Notes (Addendum)
Inpatient Rehabilitation Discharge Medication Review by a Pharmacist  A complete drug regimen review was completed for this patient to identify any potential clinically significant medication issues.  High Risk Drug Classes Is patient taking? Indication by Medication  Antipsychotic No   Anticoagulant Yes Eliquis for DVT  Antibiotic Yes Augmentin for wound infection  Opioid Yes Percocet for wound pain  Antiplatelet No   Hypoglycemics/insulin No   Vasoactive Medication Yes Propranolol for tachycardia  Chemotherapy No   Other Yes Flexeril - spasms     Clinically significant medication issues were identified that warrant physician communication and completion of prescribed/recommended actions by midnight of the next day:  No

## 2021-07-31 NOTE — Progress Notes (Signed)
Physical Therapy Discharge Summary  Patient Details  Name: Wayne Hunter MRN: [id number] Date of Birth: [birth year]  Today's Date: 07/31/2021   Patient has met 5 of 5 long term goals due to improved activity tolerance, increased strength, and decreased pain.  Patient to discharge at an ambulatory level Modified Independent.   Patient's care partner is independent to provide the necessary physical assistance at discharge.  Reasons goals not met: N/A  Recommendation:  Patient will benefit from ongoing skilled PT services in home health setting to continue to advance safe functional mobility, address ongoing impairments in BLE weakness, global deconditioning and minimize fall risk.  Equipment: RW  Reasons for discharge: treatment goals met  Patient/family agrees with progress made and goals achieved: Yes  PT Discharge Precautions/Restrictions Precautions Precautions: Fall Precaution Comments: Bil wound VACS Vital Signs Therapy Vitals Temp: 98 F (36.7 C) Pulse Rate: 86 Resp: 18 BP: (!) 107/58 Patient Position (if appropriate): Lying Oxygen Therapy SpO2: 100 % O2 Device: Room Air Pain Pain Assessment Pain Scale: 0-10 Pain Score: 5  Pain Interference Pain Interference Pain Effect on Sleep: 1. Rarely or not at all Pain Interference with Therapy Activities: 1. Rarely or not at all Pain Interference with Day-to-Day Activities: 2. Occasionally Vision/Perception  Vision - History Ability to See in Adequate Light: 0 Adequate Perception Perception: Within Functional Limits Praxis Praxis: Intact  Cognition Overall Cognitive Status: Within Functional Limits for tasks assessed Arousal/Alertness: Awake/alert Orientation Level: Oriented X4 Year: 2023 Month: January Day of Week: Correct Memory: Appears intact Immediate Memory Recall: Sock;Blue;Bed Memory Recall Sock: Without Cue Memory Recall Blue: Without Cue Memory Recall Bed: Without Cue Awareness: Appears  intact Problem Solving: Appears intact Safety/Judgment: Appears intact Sensation Sensation Light Touch: Appears Intact Hot/Cold: Appears Intact Proprioception: Appears Intact Stereognosis: Not tested Coordination Gross Motor Movements are Fluid and Coordinated: Yes Fine Motor Movements are Fluid and Coordinated: Yes Motor  Motor Motor: Within Functional Limits  Mobility Bed Mobility Supine to Sit: Independent Sit to Supine: Independent Transfers Transfers: Sit to Stand;Stand Pivot Transfers;Stand to Sit Sit to Stand: Independent with assistive device Stand to Sit: Independent with assistive device Stand Pivot Transfers: Independent with assistive device Transfer (Assistive device): Rolling walker Locomotion  Gait Ambulation: Yes Gait Assistance: Independent with assistive device Gait Distance (Feet): 300 Feet Assistive device: Rolling walker Gait Gait: Yes Gait Pattern: Step-through pattern;Decreased step length - right;Decreased step length - left;Narrow base of support Gait velocity: decreased Stairs / Additional Locomotion Stairs: Yes Stairs Assistance: Supervision/Verbal cueing Stair Management Technique: Two rails Number of Stairs: 4 Height of Stairs: 6 Pick up small object from the floor (from standing position) activity did not occur: Safety/medical concerns Wheelchair Mobility Wheelchair Mobility: No  Trunk/Postural Assessment  Cervical Assessment Cervical Assessment: Within Functional Limits Thoracic Assessment Thoracic Assessment: Within Functional Limits Lumbar Assessment Lumbar Assessment: Within Functional Limits Postural Control Postural Control: Within Functional Limits  Balance Balance Balance Assessed: Yes Dynamic Sitting Balance Dynamic Sitting - Balance Support: During functional activity Dynamic Sitting - Level of Assistance: 6: Modified independent (Device/Increase time) Static Standing Balance Static Standing - Balance Support:  Bilateral upper extremity supported Static Standing - Level of Assistance: 6: Modified independent (Device/Increase time) Dynamic Standing Balance Dynamic Standing - Balance Support: During functional activity Dynamic Standing - Level of Assistance: 6: Modified independent (Device/Increase time) Extremity Assessment  RUE Assessment RUE Assessment: Within Functional Limits LUE Assessment LUE Assessment: Within Functional Limits RLE Assessment RLE Assessment: Exceptions to [facility] General Strength Comments: Grossly 4/5 LLE Assessment  LLE Assessment: Exceptions to [facility] General Strength Comments: Grossly 4/5    Rosita DeChalus 07/31/2021, 4:13 PM

## 2021-07-31 NOTE — Telephone Encounter (Signed)
Wendi Maya, PA for In-Patient rehab called and stated that patient is set to be discharged tomorrow. Patient is still on IV antibiotics for wounds and they need to know if she needs to go home on IV antibiotics.  Please follow up.

## 2021-07-31 NOTE — Progress Notes (Signed)
Occupational Therapy Discharge Summary  Patient Details  Name: Wayne Hunter MRN: [id number] Date of Birth: 11/18/1995   Patient has met 8 of 8 long term goals due to improved activity tolerance, improved balance, and ability to compensate for deficits.  Patient to discharge at overall set up A with bathing and dressing and Modified Independent level with transfers and toileting. Patient's care partner is independent to provide the necessary physical assistance at discharge.    Pt's wife present at all sessions, family education completed.   Reasons goals not met: n/a  Recommendation:  Patient will benefit from ongoing skilled OT services in home health setting to continue to advance functional skills in the area of BADL and iADL.  Equipment: No equipment provided - pt already has needed equipment at home  Reasons for discharge: treatment goals met  Patient/family agrees with progress made and goals achieved: Yes  OT Discharge Vision Baseline Vision/History: 0 No visual deficits Patient Visual Report: No change from baseline Vision Assessment?: No apparent visual deficits Perception  Perception: Within Functional Limits Praxis Praxis: Intact Cognition Overall Cognitive Status: Within Functional Limits for tasks assessed Arousal/Alertness: Awake/alert Orientation Level: Oriented X4 Year: 2023 Month: January Day of Week: Correct Memory: Appears intact Immediate Memory Recall: Sock;Blue;Bed Memory Recall Sock: Without Cue Memory Recall Blue: Without Cue Memory Recall Bed: Without Cue Awareness: Appears intact Problem Solving: Appears intact Safety/Judgment: Appears intact Sensation Sensation Light Touch: Appears Intact Hot/Cold: Appears Intact Proprioception: Appears Intact Stereognosis: Not tested Coordination Gross Motor Movements are Fluid and Coordinated: Yes Fine Motor Movements are Fluid and Coordinated: Yes Motor  Motor Motor: Within Functional  Limits    Trunk/Postural Assessment  Cervical Assessment Cervical Assessment: Within Functional Limits Thoracic Assessment Thoracic Assessment: Within Functional Limits Lumbar Assessment Lumbar Assessment: Within Functional Limits Postural Control Postural Control: Within Functional Limits  Balance Dynamic Sitting Balance Dynamic Sitting - Balance Support: During functional activity Dynamic Sitting - Level of Assistance: 6: Modified independent (Device/Increase time) Extremity/Trunk Assessment RUE Assessment RUE Assessment: Within Functional Limits LUE Assessment LUE Assessment: Within Functional Limits   Leroy Libman 07/31/2021, 1:42 PM

## 2021-07-31 NOTE — Progress Notes (Signed)
Occupational Therapy Session Note  Patient Details  Name: Wayne Hunter MRN: [id number] Date of Birth: Nov 15, 1995  Today's Date: 07/31/2021 OT Individual Time: 1130-1155 OT Individual Time Calculation (min): 25 min    Short Term Goals: Week 1:  OT Short Term Goal 1 (Week 1): STG = LTG 2/2 ELOS  Skilled Therapeutic Interventions/Progress Updates:    Pt resting in bed upon arrival. Pt declined bathing/dressing 2/2 IV running and BLE wound vacs. Bed mobility with supervision/mod I. OT focus on discharge planning. Pt looking forward to discharge home. Pt stated he was tired from earlier PT session and walking in hallway.   Therapy Documentation Precautions:  Precautions Precautions: Fall Precaution Comments: Bil wound VACS Restrictions Weight Bearing Restrictions: Yes RLE Weight Bearing: Weight bearing as tolerated LLE Weight Bearing: Weight bearing as tolerated  Pain: Pain Assessment Pain Scale: 0-10 Pain Score: 5  Pain Type: Acute pain Pain Location: Hip Pain Orientation: Right Pain Descriptors / Indicators: Throbbing Pain Onset: On-going Patients Stated Pain Goal: 2 Pain Intervention(s): premedicated and repositioned   Therapy/Group: Individual Therapy  Rich Brave 07/31/2021, 12:31 PM

## 2021-07-31 NOTE — Progress Notes (Signed)
2 Days Post-Op  Subjective: 26 year old male with bilateral hip wounds, he is currently ambulating in the hall with physical therapy on evaluation today.  He is ambulating with a walker, his wife is present.  Patient is doing well, he is ambulating well.  He is excited to possibly go home in the next day or so.   Objective: Vital signs in last 24 hours: Temp:  [98.1 F (36.7 C)-100.4 F (38 C)] 98.4 F (36.9 C) (01/18 0512) Pulse Rate:  [87-100] 87 (01/18 0512) Resp:  [16-18] 18 (01/18 0512) BP: (111-119)/(56-76) 111/56 (01/18 0512) SpO2:  [98 %-100 %] 99 % (01/18 0512) Last BM Date: 07/28/21  Intake/Output from previous day: 01/17 0701 - 01/18 0700 In: 120 [P.O.:120] Out: 1350 [Urine:1250; Drains:100] Intake/Output this shift: No intake/output data recorded.  General appearance: alert, cooperative, no distress, and ambulating in hallway with PT and wife Resp: Unlabored Incision/Wound: Bilateral wound VAC in place, good seal is noted.  Approximately 100 cc of drainage in each canister.  Drainage is serosanguineous.  Lab Results:  CBC Latest Ref Rng & Units 07/26/2021 07/25/2021 07/22/2021  WBC 4.0 - 10.5 K/uL 10.1 7.6 8.7  Hemoglobin 13.0 - 17.0 g/dL 9.2(L) 9.3(L) 8.7(L)  Hematocrit 39.0 - 52.0 % 29.7(L) 30.4(L) 28.2(L)  Platelets 150 - 400 K/uL 566(H) 594(H) 585(H)    BMET No results for input(s): NA, K, CL, CO2, GLUCOSE, BUN, CREATININE, CALCIUM in the last 72 hours. PT/INR No results for input(s): LABPROT, INR in the last 72 hours. ABG No results for input(s): PHART, HCO3 in the last 72 hours.  Invalid input(s): PCO2, PO2  Studies/Results: No results found.  Anti-infectives: Anti-infectives (From admission, onward)    Start     Dose/Rate Route Frequency Ordered Stop   07/29/21 1600  Ampicillin-Sulbactam (UNASYN) 3 g in sodium chloride 0.9 % 100 mL IVPB        3 g 200 mL/hr over 30 Minutes Intravenous Every 8 hours 07/29/21 1415     07/29/21 0800  ceFAZolin  (ANCEF) IVPB 2g/100 mL premix        2 g 200 mL/hr over 30 Minutes Intravenous To Short Stay 07/29/21 0640 07/29/21 1130   07/26/21 0930  piperacillin-tazobactam (ZOSYN) IVPB 3.375 g  Status:  Discontinued        3.375 g 12.5 mL/hr over 240 Minutes Intravenous Every 8 hours 07/26/21 0834 07/29/21 1415       Assessment/Plan: s/p Procedure(s): [place] CHANGE  Plan for return to the OR on 08/05/2021 with Dr. Marla Roe for additional debridement, application of wound matrix and wound VAC change.  Patient is aware that surgery will be outpatient.  All of his questions and his wife questions were answered to their content.  Patient is aware to be n.p.o. prior to surgery.  Discussed with patient discharge instructions.  Patient will benefit from home health for assistance with wound VAC dressing changes once he is able to tolerate dressing changes without sedation.   LOS: 7 days    Charlies Constable, PA-C 07/31/2021

## 2021-07-31 NOTE — Progress Notes (Signed)
Patient ID: Wayne Hunter, male   DOB: Sep 25, 1995, 26 y.o.   MRN: [id number]  Spoke with Tracey-Workers [employer] CM to update plan for discharge tomorrow and on oral antibiotics as of now. She will contact home health agencies for therapies and RN. Will make aware no vac changes yet and is aware of appoint,ment on Monday 1/23 for OR and wound vac change. Has needed equipment and wife has been here for therapies.

## 2021-07-31 NOTE — Progress Notes (Signed)
Physical Therapy Session Note  Patient Details  Name: Wayne Hunter MRN: [id number] Date of Birth: [birth year]  Today's Date: 07/31/2021 PT Individual Time: 1607-3710 1305-1410 PT Individual Time Calculation (min): 70 min and 65 min  Short Term Goals: Week 1:  PT Short Term Goal 1 (Week 1): =LTGs d/t ELOS  Skilled Therapeutic Interventions/Progress Updates: Tx1: Pt presented in bed with wife present agreeable to therapy. Pt states pain currently 5/10 prior to mobility, increased with mobility, rest breaks, distraction, and emotional support provided as needed. Pt performed supine to sit independently on flat bed without rails with significantly increased time. Pt set up and participated in ambulation to/from tower hallway mod I with PTA assisting with IV pole and wound vacs. Pt required x 1 seated rest during ambulation. On way back to room pt participate din 6MWT ambulating 111ft without rest break this time. Pt initially ambulating with narrow BOS, shortened step length and decreased heel strike. With increased ambulation distance, pt noted to improve step length, BOS, and cadence. Upon return to room pt performed sit to supine independently with increased time. Pt repositioned to comfort and left with bed alarm on, call bell within reach and needs met.   Tx2: Pt presented in bed with wife present agreeable to therapy. Pt states pain 5/10, increased ambulation provided for pain management. Pt performed bed mobility mod I (use of bed rails for time) stood mod I and ambulated to elevators mod I. Pt was able to transfer on/off elevator and continued ambulation to ortho gym where pt took seated rest. Pt noted to improve gait mechanics this pm with improved cadence, step length, BOS, and heel strike. Pt participated in car transfer performed at mod I and then returned to w/c. After brief rest pt ambulated to rehab gym and participated in ascending/descending x 4 steps with B rails at supervision level.  Pt's wife then inquired about stairs without rails as that is set up at her mom's home. Discussed using RW and ascending backwards. PTA demonstrated with wife holding RW and then pt performed with wife managing RW and PTA providing CGA. Pt extremely fatigued after stairs therefore transported back to room total A. Pt performed ambulatory transfer to bed with RW and with increased time performed sit ti supine. Pt left in bed at end of session with bed alarm on, call bell within reach and needs met.      Therapy Documentation Precautions:  Precautions Precautions: Fall Precaution Comments: Bil wound VACS Restrictions Weight Bearing Restrictions: Yes RLE Weight Bearing: Weight bearing as tolerated LLE Weight Bearing: Weight bearing as tolerated General:   Vital Signs: Therapy Vitals Temp: 98 F (36.7 C) Pulse Rate: 86 Resp: 18 BP: (!) 107/58 Patient Position (if appropriate): Lying Oxygen Therapy SpO2: 100 % O2 Device: Room Air Pain: Pain Assessment Pain Scale: 0-10 Pain Score: 5  Pain Type: Acute pain Pain Location: Hip Pain Orientation: Right Pain Descriptors / Indicators: Throbbing Pain Onset: On-going Patients Stated Pain Goal: 2 Pain Intervention(s): Medication (See eMAR) Mobility:   Locomotion :    Trunk/Postural Assessment :    Balance:   Exercises:   Other Treatments:      Therapy/Group: Individual Therapy  Ina Scrivens 07/31/2021, 1:02 PM

## 2021-08-01 ENCOUNTER — Other Ambulatory Visit (HOSPITAL_COMMUNITY): Payer: Self-pay

## 2021-08-01 ENCOUNTER — Other Ambulatory Visit: Payer: Self-pay

## 2021-08-01 ENCOUNTER — Telehealth: Payer: Self-pay

## 2021-08-01 ENCOUNTER — Encounter (HOSPITAL_COMMUNITY): Payer: Self-pay | Admitting: Plastic Surgery

## 2021-08-01 DIAGNOSIS — I82402 Acute embolism and thrombosis of unspecified deep veins of left lower extremity: Secondary | ICD-10-CM

## 2021-08-01 DIAGNOSIS — R5381 Other malaise: Secondary | ICD-10-CM

## 2021-08-01 DIAGNOSIS — S71109A Unspecified open wound, unspecified thigh, initial encounter: Secondary | ICD-10-CM

## 2021-08-01 DIAGNOSIS — D62 Acute posthemorrhagic anemia: Secondary | ICD-10-CM

## 2021-08-01 MED ORDER — ACETAMINOPHEN 325 MG PO TABS: 325.0000 mg | ORAL_TABLET | Freq: Four times a day (QID) | ORAL | Status: DC | PRN

## 2021-08-01 MED ORDER — ADULT MULTIVITAMIN W/MINERALS CH: 1.0000 | ORAL_TABLET | Freq: Every day | ORAL | Status: DC

## 2021-08-01 MED ORDER — PROPRANOLOL HCL 10 MG PO TABS
10.0000 mg | ORAL_TABLET | Freq: Two times a day (BID) | ORAL | 0 refills | Status: DC
  Filled 2021-08-01: qty 30, 15d supply, fill #0

## 2021-08-01 MED ORDER — AMOXICILLIN-POT CLAVULANATE 875-125 MG PO TABS
1.0000 | ORAL_TABLET | Freq: Two times a day (BID) | ORAL | 0 refills | Status: DC
  Filled 2021-08-01: qty 14, 7d supply, fill #0

## 2021-08-01 MED ORDER — SENNOSIDES-DOCUSATE SODIUM 8.6-50 MG PO TABS: 2.0000 | ORAL_TABLET | Freq: Two times a day (BID) | ORAL | Status: DC

## 2021-08-01 MED ORDER — JUVEN PO PACK
1.0000 | PACK | Freq: Two times a day (BID) | ORAL | 2 refills | Status: DC
  Filled 2021-08-01: qty 28, 14d supply, fill #0

## 2021-08-01 MED ORDER — CYCLOBENZAPRINE HCL 5 MG PO TABS
5.0000 mg | ORAL_TABLET | Freq: Three times a day (TID) | ORAL | 0 refills | Status: DC | PRN
  Filled 2021-08-01: qty 30, 5d supply, fill #0

## 2021-08-01 MED ORDER — FERROUS SULFATE 325 (65 FE) MG PO TABS
325.0000 mg | ORAL_TABLET | Freq: Three times a day (TID) | ORAL | 0 refills | Status: DC
  Filled 2021-08-01: qty 30, 10d supply, fill #0

## 2021-08-01 MED ORDER — MAGNESIUM GLUCONATE 500 MG PO TABS
250.0000 mg | ORAL_TABLET | Freq: Every day | ORAL | 0 refills | Status: DC
  Filled 2021-08-01: qty 30, 60d supply, fill #0

## 2021-08-01 NOTE — Progress Notes (Signed)
Inpatient Rehabilitation Care Coordinator Discharge Note   Patient Details  Name: Wayne Hunter MRN: [id number] Date of Birth: 11/23/1995   Discharge location: HOME WITH WIFE WHO CAN PROVIDE 24/7 CARE  Length of Stay:  8 days  Discharge activity level: MOD/I-SUPERVISION LEVEL  Home/community participation: ACTIVE  Patient response MV:HQIONG Literacy - How often do you need to have someone help you when you read instructions, pamphlets, or other written material from your doctor or pharmacy?: Sometimes  Patient response EX:BMWUXL Isolation - How often do you feel lonely or isolated from those around you?: Rarely  Services provided included: MD, RD, PT, OT, RN, CM, Pharmacy, SW  Financial Services:  Financial Services Utilized: Worker's Charity fundraiser offered to/list presented to: PT AND WIFE  Follow-up services arranged:  Home Health, DME, Patient/Family has no preference for HH/DME agencies Home Health Agency: [facility]-PT AND OT WORKERS COMP ARRANGED    DME : WORKERS COMP ARRANGED-WOUND VAC'S, 3 IN 1 AND ROLLING WALKER    Patient response to transportation need: Is the patient able to respond to transportation needs?: Yes In the past 12 months, has lack of transportation kept you from medical appointments or from getting medications?: Yes In the past 12 months, has lack of transportation kept you from meetings, work, or from getting things needed for daily living?: Yes    Comments (or additional information):WIFE WAS HERE DAILY AND PARTICIPATED IN THERAPIES WITH PT. PT MAIN ISSUE IS HIS PAIN. WORKERS COMP TO FOLLOW AND ARRANGED ANY OTHER NEEDS AS HE CONTINUES TO RECOVER FROM HIS INJURIES. NO RESPONSE FROM PLASTICS REGARDING NEED FOR IV ANTIBIOTICS SO WILL ASSUME HE WILL GO HOME ON ORAL AND THEY WILL MANAGE THIS. SENT WORKERS COMP CM-TRACEY BELTON HIS DC SUMMARY FROM ACUTE AND REHAB ALONG WITH THERAPY SUMMARIES.  Patient/Family verbalized understanding of follow-up  arrangements:  Yes  Individual responsible for coordination of the follow-up plan: Wayne Hunter [phone]  Confirmed correct DME delivered: Wayne Hunter 08/01/2021    Wayne Hunter, Wayne Hunter

## 2021-08-01 NOTE — Progress Notes (Signed)
INPATIENT REHABILITATION DISCHARGE NOTE   Discharge instructions by: Wendi Maya PA  Verbalized understanding:yes  Skin care/Wound care healing?wound vac to go home with patient (portable)  Pain: none  IV's:none  Tubes/Drains:wound vac  O2:none  Safety instructions:done  Patient belongings: done  Discharged EX:HBZJ   Discharged IRC:[id number] with NT   Notes:patient d/c with wound vac changed to portable per order;   Chrisette Man RNC,BSN, WTA

## 2021-08-01 NOTE — Progress Notes (Addendum)
PROGRESS NOTE   Subjective/Complaints: Pt doing fairly well. Pain controlled. Happy to be going home today  ROS: Patient denies fever, rash, sore throat, blurred vision, nausea, vomiting, diarrhea, cough, shortness of breath or chest pain,  headache, or mood change.    Objective:   No results found. No results for input(s): WBC, HGB, HCT, PLT in the last 72 hours. No results for input(s): NA, K, CL, CO2, GLUCOSE, BUN, CREATININE, CALCIUM in the last 72 hours.  Intake/Output Summary (Last 24 hours) at 08/01/2021 0920 Last data filed at 08/01/2021 0005 Gross per 24 hour  Intake 240 ml  Output 950 ml  Net -710 ml        Physical Exam: Vital Signs Blood pressure (!) 107/55, pulse 85, temperature 99.2 F (37.3 C), temperature source Oral, resp. rate 18, height 5' 7.75" (1.721 m), weight 99.8 kg, SpO2 98 %.  Constitutional: No distress . Vital signs reviewed. HEENT: NCAT, EOMI, oral membranes moist Neck: supple Cardiovascular: RRR without murmur. No JVD    Respiratory/Chest: CTA Bilaterally without wheezes or rales. Normal effort    GI/Abdomen: BS +, non-tender, non-distended Ext: no clubbing, cyanosis, or edema Psych: pleasant and cooperative  Skin: large wounds with vac bilateral thighs vastus lateralis area remain in place Neurologic: Cranial nerves II through XII intact, motor strength is 5/5 in bilateral deltoid, bicep, tricep, grip, hip flexor, knee extensors, ankle dorsiflexor and plantar flexor. Normal sensory exam.  Musculoskeletal: Full range of motion in all 4 extremities. No joint swelling. Hip/knee rom causes thigh pain still   Assessment/Plan: 1. Functional deficits which require 3+ hours per day of interdisciplinary therapy in a comprehensive inpatient rehab setting. Physiatrist is providing close team supervision and 24 hour management of active medical problems listed below. Physiatrist and rehab team  continue to assess barriers to discharge/monitor patient progress toward functional and medical goals  Care Tool:  Bathing    Body parts bathed by patient: Right arm, Left arm, Chest, Abdomen, Front perineal area, Buttocks, Right upper leg, Left upper leg, Face, Right lower leg, Left lower leg   Body parts bathed by helper: Right lower leg, Left lower leg     Bathing assist Assist Level: Set up assist     Upper Body Dressing/Undressing Upper body dressing   What is the patient wearing?: Hospital gown only    Upper body assist Assist Level: Set up assist    Lower Body Dressing/Undressing Lower body dressing      What is the patient wearing?: Hospital gown only     Lower body assist Assist for lower body dressing: Set up assist     Toileting Toileting    Toileting assist Assist for toileting: Independent with assistive device     Transfers Chair/bed transfer  Transfers assist     Chair/bed transfer assist level: Independent with assistive device     Locomotion Ambulation   Ambulation assist      Assist level: Independent with assistive device Assistive device: Walker-rolling Max distance: 414ft   Walk 10 feet activity   Assist     Assist level: Independent with assistive device Assistive device: Walker-rolling   Walk 50 feet activity  Assist    Assist level: Independent with assistive device Assistive device: Walker-rolling    Walk 150 feet activity   Assist Walk 150 feet activity did not occur: Safety/medical concerns  Assist level: Independent with assistive device Assistive device: Walker-rolling    Walk 10 feet on uneven surface  activity   Assist Walk 10 feet on uneven surfaces activity did not occur: Safety/medical concerns         Wheelchair     Assist Is the patient using a wheelchair?: No Type of Wheelchair: Manual    Wheelchair assist level: Dependent - Patient 0% Max wheelchair distance: 150     Wheelchair 50 feet with 2 turns activity    Assist        Assist Level: Dependent - Patient 0%   Wheelchair 150 feet activity     Assist      Assist Level: Dependent - Patient 0%   Blood pressure (!) 107/55, pulse 85, temperature 99.2 F (37.3 C), temperature source Oral, resp. rate 18, height 5' 7.75" (1.721 m), weight 99.8 kg, SpO2 98 %.  Medical Problem List and Plan: 1. Functional deficits due to extensive bilateral thigh wounds secondary to crush injury             -dc home today  -pt to follow up with plastics for further operative mgt/vac mgt  -will not make him see [facility] 2.  Antithrombotics: -DVT/anticoagulation:  Pharmaceutical: Other (comment) Eliquis for left femoral DVT diagnosed 06/26/21             -antiplatelet therapy: None 3. Pain: continue Tylenol as needed, Flexeril as needed for spasms, Norco as needed  1/19 dc home on oxycodone for pain, vac related issues 4. Mood: LCSW to evaluate and provide emotional support             -antipsychotic agents: Not applicable 5. Neuropsych: This patient is capable of making decisions on his own behalf. 6. Skin/Wound Care:  Routine skin checks.             -- Bilateral thigh wound VAC;   wound VAC change  07/29/2021 in OR ,also scheduled on 1/23 as outpt 7. Fluids/Electrolytes/Nutrition: Routine I's and O's and follow-up chemistries 8.  Left lower extremity DVT: continue Eliquis.  Monitor for edema 9. Bowels: had bm 1/17--needs to keep up with this at hom  10. Tachycardia: increase propanolol 10mg  BID Vitals:   07/31/21 1953 08/01/21 0448  BP: (!) 113/58 (!) 107/55  Pulse: 99 85  Resp: 20 18  Temp: 99.6 F (37.6 C) 99.2 F (37.3 C)  SpO2: 98% 98%   BP controlled 1/19 11.  ABLA stable  at 9.2---f/u as outpt per plastics  12.  Fever without new symptoms WBC normal.  -plastics changed him to unasyn 1/16 -await plan for future abx   LOS: 8 days A FACE TO FACE EVALUATION WAS PERFORMED  2/16 08/01/2021, 9:20 AM

## 2021-08-01 NOTE — Progress Notes (Signed)
PCP - denies Cardiologist - denies EKG - 07/25/21 Chest x-ray -  ECHO -  Cardiac Cath -  CPAP -   Blood Thinner Instructions: Follow your surgeon's instructions on when to stop apixaban (ELIQUIS). If no instructions were given by your surgeon then you will need to call the office to get those instructions.    Aspirin Instructions:  ERAS Protcol - clears 1115 COVID TEST- n/a  Anesthesia review: n/a  -------------  SDW INSTRUCTIONS:  Your procedure is scheduled on 08/05/21. Please report to [facility] Main Entrance "A" at 11:45 A.M., and check in at the Admitting office. Call this number if you have problems the morning of surgery: [phone]   Remember: Do not eat after midnight the night before your surgery  You may drink clear liquids until 11:15 AM the morning of your surgery.   Clear liquids allowed are: Water, Non-Citrus Juices (without pulp), Carbonated Beverages, Clear Tea, Black Coffee Only, and Gatorade (do NOT add anything to drinks)    Medications to take morning of surgery with a sip of water include: acetaminophen (TYLENOL)  amoxicillin-clavulanate (AUGMENTIN)  cyclobenzaprine (FLEXERIL)  oxyCODONE-acetaminophen (PERCOCET/ROXICET) propranolol (INDERAL)   As of today, STOP taking any Aspirin (unless otherwise instructed by your surgeon), Aleve, Naproxen, Ibuprofen, Motrin, Advil, Goody's, BC's, all herbal medications, fish oil, and all vitamins.    The Morning of Surgery Do not wear jewelry Do not wear lotions, powders, colognes, or deodorant Do not bring valuables to the hospital. [facility] is not responsible for any belongings or valuables.  If you are a smoker, DO NOT Smoke 24 hours prior to surgery  If you wear a CPAP at night please bring your mask the morning of surgery   Remember that you must have someone to transport you home after your surgery, and remain with you for 24 hours if you are discharged the same day.  Please bring cases for  contacts, glasses, hearing aids, dentures or bridgework because it cannot be worn into surgery.   Patients discharged the day of surgery will not be allowed to drive home.   Please shower the NIGHT BEFORE/MORNING OF SURGERY (use antibacterial soap like DIAL soap if possible). Wear comfortable clothes the morning of surgery. Oral Hygiene is also important to reduce your risk of infection.  Remember - BRUSH YOUR TEETH THE MORNING OF SURGERY WITH YOUR REGULAR TOOTHPASTE  Patient denies shortness of breath, fever, cough and chest pain.

## 2021-08-01 NOTE — Telephone Encounter (Signed)
Patient's mother was called and informed that we spoke with Matthew,PA-C,and he would like for the patient to come in the office in the morning at 8:00am to be seen.  Patient's mother verbalized understanding and agreed to bring the patient to the office in the morning to be seen.//AB/CMA

## 2021-08-01 NOTE — Telephone Encounter (Signed)
Patient's wife, Marcelino Duster, called to say patient was discharged today and the wound vac they sent him home with, the function pressure keeps going down.  She said they spoke with the wound vac carrier and tried to reset it.  She said they determined that the clog is not in the wound vac.  They said they think it may be clogged from the cord from his wound.  Please call.

## 2021-08-02 ENCOUNTER — Ambulatory Visit (INDEPENDENT_AMBULATORY_CARE_PROVIDER_SITE_OTHER): Payer: PRIVATE HEALTH INSURANCE | Admitting: Surgical

## 2021-08-02 ENCOUNTER — Other Ambulatory Visit (HOSPITAL_COMMUNITY): Payer: Self-pay

## 2021-08-02 DIAGNOSIS — S71109D Unspecified open wound, unspecified thigh, subsequent encounter: Secondary | ICD-10-CM

## 2021-08-02 NOTE — Progress Notes (Signed)
26 year old male here for follow-up on his bilateral hip wounds.  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  We were then consulted for assistance with additional debridement and wound healing.  He subsequently underwent multiple operations over the past month for debridement, placement of wound matrix and wound VAC changes.  Patient is here today with his wife.  They called the office yesterday evening at 5 PM stating that the wound VAC was potentially clogged.  He is otherwise feeling well.  He is scheduled for additional debridement and wound VAC change on 08/05/2021.  On exam bilateral wound vacs are in place, good seal noted.  I do not appreciate any clogs.  The left wound VAC pressure is jumping from 125 mmHg to 150 mmHg intermittently.  I do not appreciate any clocks in the line.  There is some serosanguineous drainage in the canister.  There is no surrounding erythema.  Right wound VAC pressure is at 125 mmHg.  No erythema or cellulitic changes.  No bilateral lower extremity swelling is noted.  Patient is in no acute distress, walking with a walker with assistance of his wife.  His breathing is unlabored.  He is in no acute distress.  Discussed with patient if he notices a leak, can apply new wound VAC tape to obtain a seal.  Unsure of why the pressure is jumping from 125 mmHg 250 mmHg on the left side, however it is holding a good seal.  We are planning to exchange this on 08/05/2021.  Recommend holding blood thinners 1 day prior to surgery.  Recommend calling with questions or concerns.

## 2021-08-02 NOTE — H&P (View-Only) (Signed)
26 year old male here for follow-up on his bilateral hip wounds.  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  We were then consulted for assistance with additional debridement and wound healing.  He subsequently underwent multiple operations over the past month for debridement, placement of wound matrix and wound VAC changes.  Patient is here today with his wife.  They called the office yesterday evening at 5 PM stating that the wound VAC was potentially clogged.  He is otherwise feeling well.  He is scheduled for additional debridement and wound VAC change on 08/05/2021.  On exam bilateral wound vacs are in place, good seal noted.  I do not appreciate any clogs.  The left wound VAC pressure is jumping from 125 mmHg to 150 mmHg intermittently.  I do not appreciate any clocks in the line.  There is some serosanguineous drainage in the canister.  There is no surrounding erythema.  Right wound VAC pressure is at 125 mmHg.  No erythema or cellulitic changes.  No bilateral lower extremity swelling is noted.  Patient is in no acute distress, walking with a walker with assistance of his wife.  His breathing is unlabored.  He is in no acute distress.  Discussed with patient if he notices a leak, can apply new wound VAC tape to obtain a seal.  Unsure of why the pressure is jumping from 125 mmHg 250 mmHg on the left side, however it is holding a good seal.  We are planning to exchange this on 08/05/2021.  Recommend holding blood thinners 1 day prior to surgery.  Recommend calling with questions or concerns.

## 2021-08-02 NOTE — H&P (View-Only) (Signed)
25-year-old male here for follow-up on his bilateral hip wounds.  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  We were then consulted for assistance with additional debridement and wound healing.  He subsequently underwent multiple operations over the past month for debridement, placement of wound matrix and wound VAC changes.  Patient is here today with his wife.  They called the office yesterday evening at 5 PM stating that the wound VAC was potentially clogged.  He is otherwise feeling well.  He is scheduled for additional debridement and wound VAC change on 08/05/2021. ° °On exam bilateral wound vacs are in place, good seal noted.  I do not appreciate any clogs.  The left wound VAC pressure is jumping from 125 mmHg to 150 mmHg intermittently.  I do not appreciate any clocks in the line.  There is some serosanguineous drainage in the canister.  There is no surrounding erythema.  Right wound VAC pressure is at 125 mmHg.  No erythema or cellulitic changes.  No bilateral lower extremity swelling is noted. ° °Patient is in no acute distress, walking with a walker with assistance of his wife.  His breathing is unlabored.  He is in no acute distress. ° °Discussed with patient if he notices a leak, can apply new wound VAC tape to obtain a seal.  Unsure of why the pressure is jumping from 125 mmHg 250 mmHg on the left side, however it is holding a good seal.  We are planning to exchange this on 08/05/2021.  Recommend holding blood thinners 1 day prior to surgery.  Recommend calling with questions or concerns.  °

## 2021-08-05 ENCOUNTER — Ambulatory Visit (HOSPITAL_COMMUNITY): Payer: PRIVATE HEALTH INSURANCE | Admitting: Certified Registered Nurse Anesthetist

## 2021-08-05 ENCOUNTER — Ambulatory Visit (HOSPITAL_COMMUNITY)
Admission: RE | Admit: 2021-08-05 | Discharge: 2021-08-05 | Disposition: A | Discharge status: Home / Self Care | Payer: PRIVATE HEALTH INSURANCE | Attending: Plastic Surgery | Admitting: Plastic Surgery

## 2021-08-05 ENCOUNTER — Encounter (HOSPITAL_COMMUNITY): Payer: Self-pay | Admitting: Plastic Surgery

## 2021-08-05 ENCOUNTER — Encounter (HOSPITAL_COMMUNITY)
Admission: RE | Disposition: A | Discharge status: Home / Self Care | Payer: Self-pay | Source: Home / Self Care | Attending: Plastic Surgery

## 2021-08-05 ENCOUNTER — Other Ambulatory Visit: Payer: Self-pay

## 2021-08-05 DIAGNOSIS — Z4801 Encounter for change or removal of surgical wound dressing: Secondary | ICD-10-CM | POA: Diagnosis present

## 2021-08-05 DIAGNOSIS — S71102D Unspecified open wound, left thigh, subsequent encounter: Secondary | ICD-10-CM | POA: Insufficient documentation

## 2021-08-05 DIAGNOSIS — Z7901 Long term (current) use of anticoagulants: Secondary | ICD-10-CM | POA: Diagnosis not present

## 2021-08-05 DIAGNOSIS — Z86718 Personal history of other venous thrombosis and embolism: Secondary | ICD-10-CM | POA: Diagnosis not present

## 2021-08-05 DIAGNOSIS — Z79899 Other long term (current) drug therapy: Secondary | ICD-10-CM | POA: Diagnosis not present

## 2021-08-05 DIAGNOSIS — S71101D Unspecified open wound, right thigh, subsequent encounter: Secondary | ICD-10-CM | POA: Insufficient documentation

## 2021-08-05 HISTORY — PX: APPLICATION OF WOUND VAC: SHX5189

## 2021-08-05 HISTORY — PX: APPLICATION OF A-CELL OF EXTREMITY: SHX6303

## 2021-08-05 HISTORY — PX: INCISION AND DRAINAGE OF WOUND: SHX1803

## 2021-08-05 SURGERY — IRRIGATION AND DEBRIDEMENT WOUND
Anesthesia: General | Site: Thigh | Laterality: Bilateral

## 2021-08-05 MED ORDER — PROPOFOL 10 MG/ML IV BOLUS
INTRAVENOUS | Status: DC | PRN
Start: 2021-08-05 — End: 2021-08-05
  Administered 2021-08-05: 20 mg via INTRAVENOUS
  Administered 2021-08-05: 100 mg via INTRAVENOUS
  Administered 2021-08-05: 20 mg via INTRAVENOUS

## 2021-08-05 MED ORDER — CELECOXIB 200 MG PO CAPS
200.0000 mg | ORAL_CAPSULE | Freq: Once | ORAL | Status: AC
  Administered 2021-08-05: 200 mg via ORAL
  Filled 2021-08-05: qty 1

## 2021-08-05 MED ORDER — OXYCODONE HCL 5 MG PO TABS
ORAL_TABLET | ORAL | Status: AC
  Filled 2021-08-05: qty 1

## 2021-08-05 MED ORDER — 0.9 % SODIUM CHLORIDE (POUR BTL) OPTIME
TOPICAL | Status: DC | PRN
  Administered 2021-08-05: 1000 mL

## 2021-08-05 MED ORDER — DEXAMETHASONE SODIUM PHOSPHATE 10 MG/ML IJ SOLN
INTRAMUSCULAR | Status: DC | PRN
Start: 2021-08-05 — End: 2021-08-05
  Administered 2021-08-05: 10 mg via INTRAVENOUS

## 2021-08-05 MED ORDER — PROPOFOL 10 MG/ML IV BOLUS
INTRAVENOUS | Status: AC
  Filled 2021-08-05: qty 20

## 2021-08-05 MED ORDER — MIDAZOLAM HCL 2 MG/2ML IJ SOLN
INTRAMUSCULAR | Status: AC
  Filled 2021-08-05: qty 2

## 2021-08-05 MED ORDER — OXYCODONE HCL 5 MG PO TABS: 5.0000 mg | ORAL_TABLET | ORAL | Status: DC | PRN

## 2021-08-05 MED ORDER — ONDANSETRON HCL 4 MG/2ML IJ SOLN: 4.0000 mg | Freq: Once | INTRAMUSCULAR | Status: DC | PRN

## 2021-08-05 MED ORDER — ACETAMINOPHEN 500 MG PO TABS
ORAL_TABLET | ORAL | Status: AC
  Administered 2021-08-05: 1000 mg via ORAL
  Filled 2021-08-05: qty 2

## 2021-08-05 MED ORDER — ORAL CARE MOUTH RINSE: 15.0000 mL | Freq: Once | OROMUCOSAL | Status: AC

## 2021-08-05 MED ORDER — ACETAMINOPHEN 325 MG PO TABS: 650.0000 mg | ORAL_TABLET | ORAL | Status: DC | PRN

## 2021-08-05 MED ORDER — ACETAMINOPHEN 500 MG PO TABS: 1000.0000 mg | ORAL_TABLET | Freq: Once | ORAL | Status: AC

## 2021-08-05 MED ORDER — ONDANSETRON HCL 4 MG/2ML IJ SOLN
INTRAMUSCULAR | Status: DC | PRN
  Administered 2021-08-05: 4 mg via INTRAVENOUS

## 2021-08-05 MED ORDER — HEMOSTATIC AGENTS (NO CHARGE) OPTIME
TOPICAL | Status: DC | PRN
  Administered 2021-08-05: 1 via TOPICAL

## 2021-08-05 MED ORDER — CEFAZOLIN SODIUM-DEXTROSE 2-4 GM/100ML-% IV SOLN
2.0000 g | INTRAVENOUS | Status: AC
  Administered 2021-08-05: 2 g via INTRAVENOUS

## 2021-08-05 MED ORDER — OXYCODONE HCL 5 MG PO TABS
5.0000 mg | ORAL_TABLET | Freq: Once | ORAL | Status: AC | PRN
  Administered 2021-08-05: 5 mg via ORAL

## 2021-08-05 MED ORDER — SODIUM CHLORIDE 0.9% FLUSH: 3.0000 mL | INTRAVENOUS | Status: DC | PRN

## 2021-08-05 MED ORDER — HYDROMORPHONE HCL 1 MG/ML IJ SOLN
0.2500 mg | INTRAMUSCULAR | Status: DC | PRN
  Administered 2021-08-05 (×2): 0.5 mg via INTRAVENOUS

## 2021-08-05 MED ORDER — ACETAMINOPHEN 650 MG RE SUPP: 650.0000 mg | RECTAL | Status: DC | PRN

## 2021-08-05 MED ORDER — OXYCODONE HCL 5 MG/5ML PO SOLN: 5.0000 mg | Freq: Once | ORAL | Status: AC | PRN

## 2021-08-05 MED ORDER — CHLORHEXIDINE GLUCONATE CLOTH 2 % EX PADS: 6.0000 | MEDICATED_PAD | Freq: Once | CUTANEOUS | Status: DC

## 2021-08-05 MED ORDER — LIDOCAINE-EPINEPHRINE 1 %-1:100000 IJ SOLN
INTRAMUSCULAR | Status: AC
  Filled 2021-08-05: qty 1

## 2021-08-05 MED ORDER — CHLORHEXIDINE GLUCONATE 0.12 % MT SOLN
OROMUCOSAL | Status: AC
  Administered 2021-08-05: 15 mL
  Filled 2021-08-05: qty 15

## 2021-08-05 MED ORDER — FENTANYL CITRATE (PF) 100 MCG/2ML IJ SOLN: 25.0000 ug | INTRAMUSCULAR | Status: DC | PRN

## 2021-08-05 MED ORDER — SODIUM CHLORIDE 0.9% FLUSH: 3.0000 mL | Freq: Two times a day (BID) | INTRAVENOUS | Status: DC

## 2021-08-05 MED ORDER — LACTATED RINGERS IV SOLN: INTRAVENOUS | Status: DC

## 2021-08-05 MED ORDER — PROPOFOL 500 MG/50ML IV EMUL
INTRAVENOUS | Status: DC | PRN
  Administered 2021-08-05: 100 ug/kg/min via INTRAVENOUS

## 2021-08-05 MED ORDER — MIDAZOLAM HCL 2 MG/2ML IJ SOLN
INTRAMUSCULAR | Status: DC | PRN
  Administered 2021-08-05: 2 mg via INTRAVENOUS

## 2021-08-05 MED ORDER — CHLORHEXIDINE GLUCONATE 0.12 % MT SOLN: 15.0000 mL | Freq: Once | OROMUCOSAL | Status: AC

## 2021-08-05 MED ORDER — FENTANYL CITRATE (PF) 250 MCG/5ML IJ SOLN
INTRAMUSCULAR | Status: AC
  Filled 2021-08-05: qty 5

## 2021-08-05 MED ORDER — HYDROMORPHONE HCL 1 MG/ML IJ SOLN
INTRAMUSCULAR | Status: AC
  Filled 2021-08-05: qty 1

## 2021-08-05 MED ORDER — SODIUM CHLORIDE 0.9 % IV SOLN: 250.0000 mL | INTRAVENOUS | Status: DC | PRN

## 2021-08-05 MED ORDER — PHENYLEPHRINE 40 MCG/ML (10ML) SYRINGE FOR IV PUSH (FOR BLOOD PRESSURE SUPPORT)
PREFILLED_SYRINGE | INTRAVENOUS | Status: DC | PRN
  Administered 2021-08-05 (×2): 80 ug via INTRAVENOUS

## 2021-08-05 MED ORDER — CEFAZOLIN SODIUM-DEXTROSE 2-4 GM/100ML-% IV SOLN
INTRAVENOUS | Status: AC
  Filled 2021-08-05: qty 100

## 2021-08-05 MED ORDER — FENTANYL CITRATE (PF) 250 MCG/5ML IJ SOLN
INTRAMUSCULAR | Status: DC | PRN
  Administered 2021-08-05: 25 ug via INTRAVENOUS
  Administered 2021-08-05: 50 ug via INTRAVENOUS
  Administered 2021-08-05 (×2): 25 ug via INTRAVENOUS
  Administered 2021-08-05: 50 ug via INTRAVENOUS

## 2021-08-05 SURGICAL SUPPLY — 60 items
APPLICATOR COTTON TIP 6 STRL (MISCELLANEOUS) IMPLANT
APPLICATOR COTTON TIP 6IN STRL (MISCELLANEOUS) ×3
BAG COUNTER SPONGE SURGICOUNT (BAG) ×2 IMPLANT
BAG DECANTER FOR FLEXI CONT (MISCELLANEOUS) IMPLANT
BAG SURGICOUNT SPONGE COUNTING (BAG) ×1
BENZOIN TINCTURE PRP APPL 2/3 (GAUZE/BANDAGES/DRESSINGS) ×1 IMPLANT
CANISTER SUCT 3000ML PPV (MISCELLANEOUS) ×3 IMPLANT
CNTNR URN SCR LID CUP LEK RST (MISCELLANEOUS) IMPLANT
CONT SPEC 4OZ STRL OR WHT (MISCELLANEOUS)
COVER SURGICAL LIGHT HANDLE (MISCELLANEOUS) ×3 IMPLANT
DRAIN CHANNEL 19F RND (DRAIN) IMPLANT
DRAIN JP 10F RND SILICONE (MISCELLANEOUS) IMPLANT
DRAPE DERMATAC (DRAPES) ×10 IMPLANT
DRAPE HALF SHEET 40X57 (DRAPES) ×2 IMPLANT
DRAPE IMP U-DRAPE 54X76 (DRAPES) ×3 IMPLANT
DRAPE INCISE IOBAN 66X45 STRL (DRAPES) IMPLANT
DRAPE LAPAROSCOPIC ABDOMINAL (DRAPES) IMPLANT
DRAPE LAPAROTOMY 100X72 PEDS (DRAPES) ×1 IMPLANT
DRSG ADAPTIC 3X8 NADH LF (GAUZE/BANDAGES/DRESSINGS) IMPLANT
DRSG CUTIMED SORBACT 7X9 (GAUZE/BANDAGES/DRESSINGS) ×4 IMPLANT
DRSG PAD ABDOMINAL 8X10 ST (GAUZE/BANDAGES/DRESSINGS) IMPLANT
DRSG VAC ATS LRG SENSATRAC (GAUZE/BANDAGES/DRESSINGS) IMPLANT
DRSG VAC ATS MED SENSATRAC (GAUZE/BANDAGES/DRESSINGS) ×4 IMPLANT
DRSG VAC ATS SM SENSATRAC (GAUZE/BANDAGES/DRESSINGS) IMPLANT
ELECT CAUTERY BLADE 6.4 (BLADE) IMPLANT
ELECT REM PT RETURN 9FT ADLT (ELECTROSURGICAL) ×3
ELECTRODE REM PT RTRN 9FT ADLT (ELECTROSURGICAL) ×1 IMPLANT
GAUZE SPONGE 4X4 12PLY STRL (GAUZE/BANDAGES/DRESSINGS) IMPLANT
GLOVE SURG ENC MOIS LTX SZ6.5 (GLOVE) ×3 IMPLANT
GLOVE SURG ENC TEXT LTX SZ6.5 (GLOVE) ×3 IMPLANT
GOWN STRL REUS W/ TWL LRG LVL3 (GOWN DISPOSABLE) ×3 IMPLANT
GOWN STRL REUS W/TWL LRG LVL3 (GOWN DISPOSABLE) ×6
GRAFT MYRIAD 3 LAYER 7X10 (Graft) ×2 IMPLANT
HEMOSTAT ARISTA ABSORB 3G PWDR (HEMOSTASIS) ×2 IMPLANT
KIT BASIN OR (CUSTOM PROCEDURE TRAY) ×3 IMPLANT
KIT TURNOVER KIT B (KITS) ×3 IMPLANT
MATRIX WOUND 3-LAYER 10X15 (Tissue) ×1 IMPLANT
MICROMATRIX 1000MG (Tissue) ×6 IMPLANT
NDL HYPO 25GX1X1/2 BEV (NEEDLE) ×1 IMPLANT
NEEDLE HYPO 25GX1X1/2 BEV (NEEDLE) IMPLANT
NS IRRIG 1000ML POUR BTL (IV SOLUTION) ×3 IMPLANT
PACK GENERAL/GYN (CUSTOM PROCEDURE TRAY) ×3 IMPLANT
PACK UNIVERSAL I (CUSTOM PROCEDURE TRAY) ×1 IMPLANT
PAD ARMBOARD 7.5X6 YLW CONV (MISCELLANEOUS) ×6 IMPLANT
POWDER MYRIAD MORCELLS 1000MG (Miscellaneous) ×2 IMPLANT
SOLUTION PARTIC MCRMTRX 1000MG (Tissue) IMPLANT
STAPLER VISISTAT 35W (STAPLE) ×1 IMPLANT
SURGILUBE 2OZ TUBE FLIPTOP (MISCELLANEOUS) ×2 IMPLANT
SUT MNCRL AB 3-0 PS2 27 (SUTURE) IMPLANT
SUT MNCRL AB 4-0 PS2 18 (SUTURE) IMPLANT
SUT MON AB 2-0 CT1 36 (SUTURE) IMPLANT
SUT MON AB 5-0 PS2 18 (SUTURE) IMPLANT
SUT VIC AB 5-0 PS2 18 (SUTURE) ×6 IMPLANT
SUT VICRYL 3 0 (SUTURE) IMPLANT
SWAB COLLECTION DEVICE MRSA (MISCELLANEOUS) IMPLANT
SWAB CULTURE ESWAB REG 1ML (MISCELLANEOUS) IMPLANT
SYR CONTROL 10ML LL (SYRINGE) ×3 IMPLANT
TOWEL GREEN STERILE (TOWEL DISPOSABLE) ×3 IMPLANT
UNDERPAD 30X36 HEAVY ABSORB (UNDERPADS AND DIAPERS) ×3 IMPLANT
WOUND MATRIX 3-LAYER 10X15 (Tissue) ×1 IMPLANT
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
## 2021-08-05 NOTE — Transfer of Care (Signed)
Immediate Anesthesia Transfer of Care Note  Patient: Wayne Hunter  Procedure(s) Performed: IRRIGATION AND DEBRIDEMENT BILATERAL THIGH WOUND (Bilateral: Thigh) WOUND VAC CHANGE (Bilateral: Thigh)  Patient Location: PACU  Anesthesia Type:General  Level of Consciousness: awake and drowsy  Airway & Oxygen Therapy: Patient Spontanous Breathing  Post-op Assessment: Report given to RN, Post -op Vital signs reviewed and stable and Patient moving all extremities X 4  Post vital signs: Reviewed and stable  Last Vitals:  Vitals Value Taken Time  BP 136/74 08/05/21 1503  Temp    Pulse 102 08/05/21 1504  Resp 19 08/05/21 1504  SpO2 100 % 08/05/21 1504  Vitals shown include unvalidated device data.  Last Pain:  Vitals:   08/05/21 1220  TempSrc:   PainSc: 4       Patients Stated Pain Goal: 2 (08/05/21 1220)  Complications: No notable events documented.

## 2021-08-05 NOTE — Discharge Instructions (Addendum)
Keep vac in place. 125 mmHg Continuous pressure

## 2021-08-05 NOTE — Anesthesia Procedure Notes (Addendum)
Procedure Name: MAC Date/Time: 08/05/2021 2:00 PM Performed by: Inda Coke, CRNA Pre-anesthesia Checklist: Patient identified, Emergency Drugs available, Suction available, Timeout performed and Patient being monitored Patient Re-evaluated:Patient Re-evaluated prior to induction Oxygen Delivery Method: Simple face mask Induction Type: IV induction Dental Injury: Teeth and Oropharynx as per pre-operative assessment

## 2021-08-05 NOTE — Anesthesia Procedure Notes (Signed)
Procedure Name: LMA Insertion Date/Time: 08/05/2021 2:13 PM Performed by: Epifanio Lesches, CRNA Pre-anesthesia Checklist: Patient identified, Emergency Drugs available, Suction available and Patient being monitored Patient Re-evaluated:Patient Re-evaluated prior to induction Oxygen Delivery Method: Circle System Utilized Preoxygenation: Pre-oxygenation with 100% oxygen Induction Type: IV induction Ventilation: Mask ventilation without difficulty LMA: LMA inserted LMA Size: 4.0 Number of attempts: 1 Placement Confirmation: positive ETCO2 and breath sounds checked- equal and bilateral Tube secured with: Tape Dental Injury: Teeth and Oropharynx as per pre-operative assessment
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
## 2021-08-05 NOTE — Anesthesia Postprocedure Evaluation (Signed)
Anesthesia Post Note ° °Patient: Hermann M Rembold ° °Procedure(s) Performed: IRRIGATION AND DEBRIDEMENT BILATERAL THIGH WOUND (Bilateral: Thigh) °WOUND VAC CHANGE (Bilateral: Thigh) °APPLICATION OF A-CELL AND MYRIAD MATRIX TO BILATERAL THIGH WOUNDS (Bilateral: Thigh) ° °  ° °Patient location during evaluation: PACU °Anesthesia Type: General °Level of consciousness: sedated and patient cooperative °Pain management: pain level controlled °Vital Signs Assessment: post-procedure vital signs reviewed and stable °Respiratory status: spontaneous breathing °Cardiovascular status: stable °Anesthetic complications: no ° ° °No notable events documented. ° °Last Vitals:  °Vitals:  ° 08/05/21 1533 08/05/21 1548  °BP: (!) 141/77 (!) 116/93  °Pulse: 86 93  °Resp: 13 17  °Temp:  36.7 °C  °SpO2: 99% 100%  °  °Last Pain:  °Vitals:  ° 08/05/21 1548  °TempSrc:   °PainSc: 3   ° ° °  °  °  °  °  °  ° °John Germeroth ° ° ° ° °

## 2021-08-05 NOTE — Interval H&P Note (Signed)
History and Physical Interval Note:  08/05/2021 1:46 PM  Wayne Hunter  has presented today for surgery, with the diagnosis of Bilateral Thigh Wounds.  The various methods of treatment have been discussed with the patient and family. After consideration of risks, benefits and other options for treatment, the patient has consented to  Procedure(s) with comments: IRRIGATION AND DEBRIDEMENT BILATERAL THIGH WOUND (Bilateral) - 45 MIN WOUND VAC CHANGE (Bilateral) as a surgical intervention.  The patient's history has been reviewed, patient examined, no change in status, stable for surgery.  I have reviewed the patient's chart and labs.  Questions were answered to the patient's satisfaction.     Alena Bills Suzzette Gasparro

## 2021-08-06 ENCOUNTER — Ambulatory Visit: Payer: Medicaid Other | Admitting: Surgical

## 2021-08-06 ENCOUNTER — Encounter (HOSPITAL_COMMUNITY): Payer: Self-pay | Admitting: Plastic Surgery

## 2021-08-09 ENCOUNTER — Encounter (HOSPITAL_COMMUNITY): Payer: Self-pay | Admitting: Plastic Surgery

## 2021-08-09 ENCOUNTER — Other Ambulatory Visit: Payer: Self-pay

## 2021-08-09 NOTE — Progress Notes (Signed)
Spoke with pt's wife, Wayne Hunter for pre-op call. I spoke with her about a month ago and pt has been in the hospital and rehab up until a week ago. She states pt is doing well. Pt is on Eliquis due to a blood clot in his left leg. Per Keenan Bachelor, PA pt is to hold his Eliquis 1 day prior to surgery. Last dose will be Saturday PM dose.   Pt's surgery is scheduled as ambulatory so no Covid test is required prior to surgery.
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
## 2021-08-12 ENCOUNTER — Ambulatory Visit (HOSPITAL_COMMUNITY): Payer: PRIVATE HEALTH INSURANCE | Admitting: Anesthesiology

## 2021-08-12 ENCOUNTER — Encounter (HOSPITAL_COMMUNITY): Payer: Self-pay | Admitting: Plastic Surgery

## 2021-08-12 ENCOUNTER — Ambulatory Visit (HOSPITAL_COMMUNITY)
Admission: RE | Admit: 2021-08-12 | Discharge: 2021-08-12 | Disposition: A | Discharge status: Home / Self Care | Payer: PRIVATE HEALTH INSURANCE | Attending: Plastic Surgery | Admitting: Plastic Surgery

## 2021-08-12 ENCOUNTER — Other Ambulatory Visit: Payer: Self-pay

## 2021-08-12 ENCOUNTER — Encounter (HOSPITAL_COMMUNITY)
Admission: RE | Disposition: A | Discharge status: Home / Self Care | Payer: Self-pay | Source: Home / Self Care | Attending: Plastic Surgery

## 2021-08-12 DIAGNOSIS — Z86718 Personal history of other venous thrombosis and embolism: Secondary | ICD-10-CM | POA: Insufficient documentation

## 2021-08-12 DIAGNOSIS — S71102A Unspecified open wound, left thigh, initial encounter: Secondary | ICD-10-CM | POA: Diagnosis not present

## 2021-08-12 DIAGNOSIS — W230XXA Caught, crushed, jammed, or pinched between moving objects, initial encounter: Secondary | ICD-10-CM | POA: Insufficient documentation

## 2021-08-12 DIAGNOSIS — S7712XA Crushing injury of left thigh, initial encounter: Secondary | ICD-10-CM | POA: Diagnosis present

## 2021-08-12 DIAGNOSIS — Z7901 Long term (current) use of anticoagulants: Secondary | ICD-10-CM | POA: Diagnosis not present

## 2021-08-12 DIAGNOSIS — S71102D Unspecified open wound, left thigh, subsequent encounter: Secondary | ICD-10-CM | POA: Diagnosis not present

## 2021-08-12 DIAGNOSIS — S7711XA Crushing injury of right thigh, initial encounter: Secondary | ICD-10-CM | POA: Insufficient documentation

## 2021-08-12 DIAGNOSIS — S71101D Unspecified open wound, right thigh, subsequent encounter: Secondary | ICD-10-CM | POA: Diagnosis not present

## 2021-08-12 DIAGNOSIS — S71101A Unspecified open wound, right thigh, initial encounter: Secondary | ICD-10-CM | POA: Insufficient documentation

## 2021-08-12 HISTORY — PX: APPLICATION OF WOUND VAC: SHX5189

## 2021-08-12 HISTORY — PX: INCISION AND DRAINAGE OF WOUND: SHX1803

## 2021-08-12 SURGERY — IRRIGATION AND DEBRIDEMENT WOUND
Anesthesia: General | Site: Thigh | Laterality: Bilateral

## 2021-08-12 MED ORDER — SODIUM CHLORIDE 0.9% FLUSH: 3.0000 mL | Freq: Two times a day (BID) | INTRAVENOUS | Status: DC

## 2021-08-12 MED ORDER — NEOSTIGMINE METHYLSULFATE 3 MG/3ML IV SOSY
PREFILLED_SYRINGE | INTRAVENOUS | Status: AC
  Filled 2021-08-12: qty 3

## 2021-08-12 MED ORDER — ONDANSETRON HCL 4 MG/2ML IJ SOLN: 4.0000 mg | Freq: Once | INTRAMUSCULAR | Status: DC | PRN

## 2021-08-12 MED ORDER — OXYCODONE HCL 5 MG PO TABS: 5.0000 mg | ORAL_TABLET | ORAL | Status: DC | PRN

## 2021-08-12 MED ORDER — ACETAMINOPHEN 325 MG PO TABS: 325.0000 mg | ORAL_TABLET | ORAL | Status: DC | PRN

## 2021-08-12 MED ORDER — CHLORHEXIDINE GLUCONATE 0.12 % MT SOLN
15.0000 mL | Freq: Once | OROMUCOSAL | Status: AC
  Administered 2021-08-12: 15 mL via OROMUCOSAL
  Filled 2021-08-12: qty 15

## 2021-08-12 MED ORDER — FENTANYL CITRATE (PF) 100 MCG/2ML IJ SOLN: 25.0000 ug | INTRAMUSCULAR | Status: DC | PRN

## 2021-08-12 MED ORDER — PROPOFOL 10 MG/ML IV BOLUS
INTRAVENOUS | Status: AC
  Filled 2021-08-12: qty 20

## 2021-08-12 MED ORDER — CHLORHEXIDINE GLUCONATE CLOTH 2 % EX PADS
6.0000 | MEDICATED_PAD | Freq: Once | CUTANEOUS | Status: DC
Start: 2021-08-12 — End: 2021-08-12

## 2021-08-12 MED ORDER — MIDAZOLAM HCL 2 MG/2ML IJ SOLN
INTRAMUSCULAR | Status: DC | PRN
  Administered 2021-08-12: 2 mg via INTRAVENOUS

## 2021-08-12 MED ORDER — FENTANYL CITRATE (PF) 250 MCG/5ML IJ SOLN
INTRAMUSCULAR | Status: DC | PRN
  Administered 2021-08-12 (×4): 50 ug via INTRAVENOUS

## 2021-08-12 MED ORDER — PHENYLEPHRINE 40 MCG/ML (10ML) SYRINGE FOR IV PUSH (FOR BLOOD PRESSURE SUPPORT)
PREFILLED_SYRINGE | INTRAVENOUS | Status: AC
  Filled 2021-08-12: qty 10

## 2021-08-12 MED ORDER — LACTATED RINGERS IV SOLN: INTRAVENOUS | Status: DC

## 2021-08-12 MED ORDER — OXYCODONE HCL 5 MG PO TABS: 5.0000 mg | ORAL_TABLET | Freq: Once | ORAL | Status: DC | PRN

## 2021-08-12 MED ORDER — ACETAMINOPHEN 160 MG/5ML PO SOLN: 325.0000 mg | ORAL | Status: DC | PRN

## 2021-08-12 MED ORDER — MEPERIDINE HCL 25 MG/ML IJ SOLN: 6.2500 mg | INTRAMUSCULAR | Status: DC | PRN

## 2021-08-12 MED ORDER — KETAMINE HCL 10 MG/ML IJ SOLN
INTRAMUSCULAR | Status: DC | PRN
Start: 2021-08-12 — End: 2021-08-12
  Administered 2021-08-12: 30 mg via INTRAVENOUS
  Administered 2021-08-12: 10 mg via INTRAVENOUS

## 2021-08-12 MED ORDER — LIDOCAINE-EPINEPHRINE 1 %-1:100000 IJ SOLN
INTRAMUSCULAR | Status: AC
  Filled 2021-08-12: qty 2

## 2021-08-12 MED ORDER — LIDOCAINE 2% (20 MG/ML) 5 ML SYRINGE
INTRAMUSCULAR | Status: DC | PRN
  Administered 2021-08-12: 80 mg via INTRAVENOUS

## 2021-08-12 MED ORDER — ACETAMINOPHEN 325 MG PO TABS: 650.0000 mg | ORAL_TABLET | ORAL | Status: DC | PRN

## 2021-08-12 MED ORDER — 0.9 % SODIUM CHLORIDE (POUR BTL) OPTIME
TOPICAL | Status: DC | PRN
  Administered 2021-08-12: 1000 mL

## 2021-08-12 MED ORDER — SODIUM CHLORIDE 0.9 % IV SOLN: 250.0000 mL | INTRAVENOUS | Status: DC | PRN

## 2021-08-12 MED ORDER — GLYCOPYRROLATE PF 0.2 MG/ML IJ SOSY
PREFILLED_SYRINGE | INTRAMUSCULAR | Status: AC
  Filled 2021-08-12: qty 1

## 2021-08-12 MED ORDER — KETAMINE HCL 50 MG/5ML IJ SOSY
PREFILLED_SYRINGE | INTRAMUSCULAR | Status: AC
  Filled 2021-08-12: qty 5

## 2021-08-12 MED ORDER — ONDANSETRON HCL 4 MG/2ML IJ SOLN
INTRAMUSCULAR | Status: AC
  Filled 2021-08-12: qty 2

## 2021-08-12 MED ORDER — FENTANYL CITRATE (PF) 250 MCG/5ML IJ SOLN
INTRAMUSCULAR | Status: AC
  Filled 2021-08-12: qty 5

## 2021-08-12 MED ORDER — SUCCINYLCHOLINE CHLORIDE 200 MG/10ML IV SOSY
PREFILLED_SYRINGE | INTRAVENOUS | Status: AC
  Filled 2021-08-12: qty 10

## 2021-08-12 MED ORDER — MIDAZOLAM HCL 2 MG/2ML IJ SOLN
INTRAMUSCULAR | Status: AC
  Filled 2021-08-12: qty 2

## 2021-08-12 MED ORDER — CEFAZOLIN SODIUM-DEXTROSE 2-4 GM/100ML-% IV SOLN
2.0000 g | INTRAVENOUS | Status: AC
  Administered 2021-08-12: 2 g via INTRAVENOUS
  Filled 2021-08-12: qty 100

## 2021-08-12 MED ORDER — PROPOFOL 10 MG/ML IV BOLUS
INTRAVENOUS | Status: DC | PRN
  Administered 2021-08-12: 200 mg via INTRAVENOUS
  Administered 2021-08-12: 70 mg via INTRAVENOUS

## 2021-08-12 MED ORDER — SODIUM CHLORIDE (PF) 0.9 % IJ SOLN
INTRAMUSCULAR | Status: AC
  Filled 2021-08-12: qty 10

## 2021-08-12 MED ORDER — DEXAMETHASONE SODIUM PHOSPHATE 10 MG/ML IJ SOLN
INTRAMUSCULAR | Status: DC | PRN
Start: 2021-08-12 — End: 2021-08-12
  Administered 2021-08-12: 10 mg via INTRAVENOUS

## 2021-08-12 MED ORDER — ORAL CARE MOUTH RINSE: 15.0000 mL | Freq: Once | OROMUCOSAL | Status: AC

## 2021-08-12 MED ORDER — SODIUM CHLORIDE 0.9% FLUSH: 3.0000 mL | INTRAVENOUS | Status: DC | PRN

## 2021-08-12 MED ORDER — ARTIFICIAL TEARS OPHTHALMIC OINT
TOPICAL_OINTMENT | OPHTHALMIC | Status: AC
  Filled 2021-08-12: qty 3.5

## 2021-08-12 MED ORDER — ACETAMINOPHEN 650 MG RE SUPP: 650.0000 mg | RECTAL | Status: DC | PRN

## 2021-08-12 MED ORDER — LIDOCAINE 2% (20 MG/ML) 5 ML SYRINGE
INTRAMUSCULAR | Status: AC
  Filled 2021-08-12: qty 5

## 2021-08-12 MED ORDER — ONDANSETRON HCL 4 MG/2ML IJ SOLN
INTRAMUSCULAR | Status: DC | PRN
  Administered 2021-08-12: 4 mg via INTRAVENOUS

## 2021-08-12 MED ORDER — ROCURONIUM BROMIDE 10 MG/ML (PF) SYRINGE
PREFILLED_SYRINGE | INTRAVENOUS | Status: AC
  Filled 2021-08-12: qty 10

## 2021-08-12 MED ORDER — OXYCODONE HCL 5 MG/5ML PO SOLN: 5.0000 mg | Freq: Once | ORAL | Status: DC | PRN

## 2021-08-12 SURGICAL SUPPLY — 57 items
APPLICATOR COTTON TIP 6 STRL (MISCELLANEOUS) IMPLANT
APPLICATOR COTTON TIP 6IN STRL (MISCELLANEOUS)
BAG COUNTER SPONGE SURGICOUNT (BAG) ×2 IMPLANT
BAG DECANTER FOR FLEXI CONT (MISCELLANEOUS) IMPLANT
BENZOIN TINCTURE PRP APPL 2/3 (GAUZE/BANDAGES/DRESSINGS) ×1 IMPLANT
CANISTER SUCT 3000ML PPV (MISCELLANEOUS) ×2 IMPLANT
CNTNR URN SCR LID CUP LEK RST (MISCELLANEOUS) IMPLANT
CONT SPEC 4OZ STRL OR WHT (MISCELLANEOUS)
COVER SURGICAL LIGHT HANDLE (MISCELLANEOUS) ×2 IMPLANT
DRAIN CHANNEL 19F RND (DRAIN) IMPLANT
DRAIN JP 10F RND SILICONE (MISCELLANEOUS) IMPLANT
DRAPE DERMATAC (DRAPES) ×2 IMPLANT
DRAPE HALF SHEET 40X57 (DRAPES) ×1 IMPLANT
DRAPE IMP U-DRAPE 54X76 (DRAPES) ×1 IMPLANT
DRAPE INCISE IOBAN 66X45 STRL (DRAPES) ×2 IMPLANT
DRAPE LAPAROSCOPIC ABDOMINAL (DRAPES) IMPLANT
DRAPE LAPAROTOMY 100X72 PEDS (DRAPES) ×1 IMPLANT
DRSG ADAPTIC 3X8 NADH LF (GAUZE/BANDAGES/DRESSINGS) IMPLANT
DRSG CUTIMED SORBACT 7X9 (GAUZE/BANDAGES/DRESSINGS) ×2 IMPLANT
DRSG PAD ABDOMINAL 8X10 ST (GAUZE/BANDAGES/DRESSINGS) IMPLANT
DRSG VAC ATS LRG SENSATRAC (GAUZE/BANDAGES/DRESSINGS) IMPLANT
DRSG VAC ATS MED SENSATRAC (GAUZE/BANDAGES/DRESSINGS) ×1 IMPLANT
DRSG VAC ATS SM SENSATRAC (GAUZE/BANDAGES/DRESSINGS) ×1 IMPLANT
ELECT CAUTERY BLADE 6.4 (BLADE) IMPLANT
ELECT REM PT RETURN 9FT ADLT (ELECTROSURGICAL) ×2
ELECTRODE REM PT RTRN 9FT ADLT (ELECTROSURGICAL) ×1 IMPLANT
GAUZE SPONGE 4X4 12PLY STRL (GAUZE/BANDAGES/DRESSINGS) ×1 IMPLANT
GLOVE SURG ENC MOIS LTX SZ6.5 (GLOVE) ×2 IMPLANT
GLOVE SURG ENC TEXT LTX SZ6.5 (GLOVE) ×2 IMPLANT
GOWN STRL REUS W/ TWL LRG LVL3 (GOWN DISPOSABLE) ×3 IMPLANT
GOWN STRL REUS W/TWL LRG LVL3 (GOWN DISPOSABLE) ×3
GRAFT MYRIAD 3 LAYER 7X10 (Graft) ×1 IMPLANT
KIT BASIN OR (CUSTOM PROCEDURE TRAY) ×2 IMPLANT
KIT TURNOVER KIT B (KITS) ×2 IMPLANT
MATRIX WOUND 3-LAYER 7X10 (Tissue) ×1 IMPLANT
MICROMATRIX 1000MG (Tissue) ×2 IMPLANT
NDL HYPO 25GX1X1/2 BEV (NEEDLE) ×1 IMPLANT
NEEDLE HYPO 25GX1X1/2 BEV (NEEDLE) IMPLANT
NS IRRIG 1000ML POUR BTL (IV SOLUTION) ×2 IMPLANT
PACK GENERAL/GYN (CUSTOM PROCEDURE TRAY) ×2 IMPLANT
PACK UNIVERSAL I (CUSTOM PROCEDURE TRAY) ×2 IMPLANT
PAD ARMBOARD 7.5X6 YLW CONV (MISCELLANEOUS) ×4 IMPLANT
POWDER MYRIAD MORCELLS 1000MG (Miscellaneous) ×1 IMPLANT
SOLUTION PARTIC MCRMTRX 1000MG (Tissue) IMPLANT
STAPLER VISISTAT 35W (STAPLE) ×1 IMPLANT
SURGILUBE 2OZ TUBE FLIPTOP (MISCELLANEOUS) ×1 IMPLANT
SUT MNCRL AB 3-0 PS2 27 (SUTURE) IMPLANT
SUT MNCRL AB 4-0 PS2 18 (SUTURE) IMPLANT
SUT MON AB 2-0 CT1 36 (SUTURE) IMPLANT
SUT MON AB 5-0 PS2 18 (SUTURE) IMPLANT
SUT VIC AB 5-0 PS2 18 (SUTURE) ×4 IMPLANT
SUT VICRYL 3 0 (SUTURE) IMPLANT
SWAB COLLECTION DEVICE MRSA (MISCELLANEOUS) IMPLANT
SWAB CULTURE ESWAB REG 1ML (MISCELLANEOUS) IMPLANT
SYR CONTROL 10ML LL (SYRINGE) ×1 IMPLANT
TOWEL GREEN STERILE (TOWEL DISPOSABLE) ×2 IMPLANT
UNDERPAD 30X36 HEAVY ABSORB (UNDERPADS AND DIAPERS) ×3 IMPLANT

## 2021-08-12 NOTE — Anesthesia Postprocedure Evaluation (Signed)
Anesthesia Post Note  Patient: Wayne Hunter  Procedure(s) Performed: IRRIGATION AND DEBRIDEMENT BILATERAL THIGH WOUNDS (Bilateral: Thigh) BILATERAL WOUND VAC CHANGE (Bilateral: Thigh) BILATERAL APPLICATIONS OF SKIN SUBSTITUTE (Bilateral: Thigh)     Patient location during evaluation: PACU Anesthesia Type: General Level of consciousness: awake and alert Pain management: pain level controlled Vital Signs Assessment: post-procedure vital signs reviewed and stable Respiratory status: spontaneous breathing, nonlabored ventilation, respiratory function stable and patient connected to nasal cannula oxygen Cardiovascular status: blood pressure returned to baseline and stable Postop Assessment: no apparent nausea or vomiting Anesthetic complications: no   No notable events documented.  Last Vitals:  Vitals:   08/12/21 0830 08/12/21 0845  BP: 125/70 137/75  Pulse: (!) 104 (!) 109  Resp: (!) 22 (!) 21  Temp: 36.7 C   SpO2: 100% 100%    Last Pain:  Vitals:   08/12/21 0830  TempSrc:   PainSc: Asleep                 Onisha Cedeno

## 2021-08-12 NOTE — Anesthesia Procedure Notes (Addendum)
Procedure Name: LMA Insertion Date/Time: 08/12/2021 7:39 AM Performed by: Zollie Beckers, CRNA Pre-anesthesia Checklist: Patient identified, Emergency Drugs available, Suction available and Patient being monitored Patient Re-evaluated:Patient Re-evaluated prior to induction Oxygen Delivery Method: Circle System Utilized Preoxygenation: Pre-oxygenation with 100% oxygen Induction Type: IV induction Ventilation: Mask ventilation without difficulty LMA: LMA inserted LMA Size: 4.0 Number of attempts: 1 Placement Confirmation: positive ETCO2 Tube secured with: Tape Dental Injury: Teeth and Oropharynx as per pre-operative assessment

## 2021-08-12 NOTE — Interval H&P Note (Signed)
History and Physical Interval Note:  08/12/2021 7:06 AM  Wayne Hunter  has presented today for surgery, with the diagnosis of Bilateral Thigh Wound.  The various methods of treatment have been discussed with the patient and family. After consideration of risks, benefits and other options for treatment, the patient has consented to  Procedure(s) with comments: IRRIGATION AND DEBRIDEMENT THIGH WOUND (Bilateral) - 1 hour APPLICATION OF WOUND VAC (Bilateral) APPLICATION OF SKIN SUBSTITUTE (Bilateral) as a surgical intervention.  The patient's history has been reviewed, patient examined, no change in status, stable for surgery.  I have reviewed the patient's chart and labs.  Questions were answered to the patient's satisfaction.     [name]

## 2021-08-12 NOTE — Op Note (Signed)
DATE OF OPERATION: 08/12/2021  LOCATION: [facility]  PREOPERATIVE DIAGNOSIS: bilateral thigh wounds  POSTOPERATIVE DIAGNOSIS: Same  PROCEDURE:  Left thigh: preparation of 7 x 13 cm wound for placement of Myriad powder 1 gm and 7 x 10 cm sheet 2.   Left thigh:  placement of VAC 3.   Right thigh:  preparation of 10 x 15 cm wound for placement of Acell powder 1 gm and 7 x 10 cm sheet 4.   Right thigh:  placement of VAC  SURGEON: Ceri Mayer Sanger Jacqualyn Sedgwick, DO  ASSISTANT: Roetta Sessions, PA  EBL: 5 cc  CONDITION: Stable  COMPLICATIONS: None  INDICATION: The patient, Wayne Hunter, is a 26 y.o. male born on [birth year], is here for treatment of bilateral traumatic thigh wounds.   PROCEDURE DETAILS:  The patient was seen prior to surgery and marked.  The IV antibiotics were given. The patient was taken to the operating room and given a general anesthetic. A standard time out was performed and all information was confirmed by those in the room.   The thighs were prepped and draped.    Left: The wound of the thigh 7 x 13 cm was irrigated with saline.  All of the Myriad powder and sheet was applied and secured with the 5-0 Vicryl.  The sorbact was applied and secured with the Vicryl.  The VAC and KY gel was placed.  There was an excellent seal.   Right:  The wound of the thigh 10 x 15 cm was irrigated with saline.  All of the Acell powder and sheet was applied and secured with the 5-0 Vicryl. The sorbact was applied and secured with the Vicryl.  The VAC and KY gel was applied and there was an excellent seal. The patient was allowed to wake up and taken to recovery room in stable condition at the end of the case. The family was notified at the end of the case. The patient and wife know to start Eliquist today.   The advanced practice practitioner (APP) assisted throughout the case.  The APP was essential in retraction and counter traction when needed to make the case  progress smoothly.  This retraction and assistance made it possible to see the tissue plans for the procedure.  The assistance was needed for blood control, tissue re-approximation and assisted with closure of the incision site.

## 2021-08-12 NOTE — Discharge Instructions (Addendum)
Keep VAC in place 125 mm Hg continuous pressure

## 2021-08-12 NOTE — Transfer of Care (Signed)
Immediate Anesthesia Transfer of Care Note  Patient: Wayne Hunter  Procedure(s) Performed: IRRIGATION AND DEBRIDEMENT BILATERAL THIGH WOUNDS (Bilateral: Thigh) BILATERAL WOUND VAC CHANGE (Bilateral: Thigh) BILATERAL APPLICATIONS OF SKIN SUBSTITUTE (Bilateral: Thigh)  Patient Location: PACU  Anesthesia Type:General  Level of Consciousness: drowsy  Airway & Oxygen Therapy: Patient Spontanous Breathing and Patient connected to face mask oxygen  Post-op Assessment: Report given to RN and Post -op Vital signs reviewed and stable  Post vital signs: Reviewed and stable  Last Vitals:  Vitals Value Taken Time  BP 125/70 08/12/21 0827  Temp    Pulse 104 08/12/21 0829  Resp 21 08/12/21 0829  SpO2 100 % 08/12/21 0829  Vitals shown include unvalidated device data.  Last Pain:  Vitals:   08/12/21 0618  TempSrc:   PainSc: 5       Patients Stated Pain Goal: 2 (08/12/21 0618)  Complications: No notable events documented.

## 2021-08-13 ENCOUNTER — Encounter (HOSPITAL_COMMUNITY): Payer: Self-pay | Admitting: Plastic Surgery

## 2021-08-15 ENCOUNTER — Encounter (HOSPITAL_COMMUNITY): Payer: Self-pay | Admitting: Plastic Surgery

## 2021-08-15 ENCOUNTER — Other Ambulatory Visit: Payer: Self-pay

## 2021-08-15 NOTE — Progress Notes (Signed)
PCP - denies Cardiologist - denies EKG - 07/25/21 Chest x-ray -  ECHO -   Blood Thinner Instructions: Follow your surgeon's instructions on when to stop Eliquis. If no instructions were given by your surgeon then you will need to call the office to get those instructions.    Aspirin Instructions:  ERAS Protcol - clears 0730 COVID TEST- n/a  Anesthesia review: n/a  -------------  SDW INSTRUCTIONS:  Your procedure is scheduled on Monday 2/6. Please report to [facility] Main Entrance "A" at 0800 A.M., and check in at the Admitting office. Call this number if you have problems the morning of surgery: [phone]   Remember: Do not eat after midnight the night before your surgery  You may drink clear liquids until 0730 AM the morning of your surgery.   Clear liquids allowed are: Water, Non-Citrus Juices (without pulp), Carbonated Beverages, Clear Tea, Black Coffee Only, and Gatorade   Medications to take morning of surgery with a sip of water include: acetaminophen (TYLENOL) if needed amoxicillin-clavulanate (AUGMENTIN)  cyclobenzaprine (FLEXERIL) if needed oxyCODONE-acetaminophen (PERCOCET/ROXICET) if needed propranolol (INDERAL)    Follow your surgeon's instructions on when to stop apixaban (ELIQUIS)  If no instructions were given by your surgeon then you will need to call the office to get those instructions.    As of today, STOP taking any Aspirin (unless otherwise instructed by your surgeon), Aleve, Naproxen, Ibuprofen, Motrin, Advil, Goody's, BC's, all herbal medications, fish oil, and all vitamins.    The Morning of Surgery Do not wear jewelry Do not wear lotions, powders, colognes, or deodorant Do not bring valuables to the hospital. [facility] is not responsible for any belongings or valuables.  If you are a smoker, DO NOT Smoke 24 hours prior to surgery  If you wear a CPAP at night please bring your mask the morning of surgery   Remember that you must have  someone to transport you home after your surgery, and remain with you for 24 hours if you are discharged the same day.  Please bring cases for contacts, glasses, hearing aids, dentures or bridgework because it cannot be worn into surgery.   Patients discharged the day of surgery will not be allowed to drive home.   Please shower the NIGHT BEFORE/MORNING OF SURGERY (use antibacterial soap like DIAL soap if possible). Wear comfortable clothes the morning of surgery. Oral Hygiene is also important to reduce your risk of infection.  Remember - BRUSH YOUR TEETH THE MORNING OF SURGERY WITH YOUR REGULAR TOOTHPASTE  Patient denies shortness of breath, fever, cough and chest pain.

## 2021-08-17 ENCOUNTER — Emergency Department (HOSPITAL_COMMUNITY)
Admission: EM | Admit: 2021-08-17 | Discharge: 2021-08-18 | Disposition: A | Discharge status: Home / Self Care | Payer: PRIVATE HEALTH INSURANCE | Attending: Emergency Medicine | Admitting: Emergency Medicine

## 2021-08-17 ENCOUNTER — Other Ambulatory Visit: Payer: Self-pay

## 2021-08-17 DIAGNOSIS — Z7901 Long term (current) use of anticoagulants: Secondary | ICD-10-CM | POA: Diagnosis not present

## 2021-08-17 DIAGNOSIS — Z4689 Encounter for fitting and adjustment of other specified devices: Secondary | ICD-10-CM

## 2021-08-17 DIAGNOSIS — Z4801 Encounter for change or removal of surgical wound dressing: Secondary | ICD-10-CM | POA: Insufficient documentation

## 2021-08-17 NOTE — ED Provider Triage Note (Signed)
Emergency Medicine Provider Triage Evaluation Note  Wayne Hunter , a 26 y.o. male  was evaluated in triage.  Pt complains of wound VAC malfunction since this morning.  Patient reports he is placed tape around the wound to help with seal but without success.  Currently working without issue.  He states he just reset of the machine.  Denies fever, chills.  Wound VAC has been in place since mid January.  Review of Systems  Positive: As above Negative: As above  Physical Exam  BP 100/70 (BP Location: Right Arm)    Pulse 96    Temp 98.1 F (36.7 C) (Oral)    Resp 18    SpO2 100%  Gen:   Awake, no distress   Resp:  Normal effort  MSK:   Moves extremities without difficulty  Other:    Medical Decision Making  Medically screening exam initiated at 11:23 PM.  Appropriate orders placed.  Wayne Hunter was informed that the remainder of the evaluation will be completed by another provider, this initial triage assessment does not replace that evaluation, and the importance of remaining in the ED until their evaluation is complete.     Marita Kansas, PA-C 08/17/21 2324

## 2021-08-17 NOTE — ED Triage Notes (Signed)
Pt here for wound vac alarming "air leak" all day, pt's wound is to R upper thigh, states the dressing was changed on Monday (goes every Monday), pt tried applying more tape to stop leak but it hasn't worked. Pt has bilateral wound vacs to both thighs since January.

## 2021-08-18 NOTE — ED Provider Notes (Signed)
[facility] EMERGENCY DEPARTMENT Provider Note   CSN: [id number] Arrival date & time: 08/17/21  2256     History Chief Complaint  Patient presents with   Wound vac problem    Wayne Hunter is a 26 y.o. male.  26 year old male who had some injuries to his legs that have been getting debrided and treated by plastic surgery for the last few weeks.  Has a wound VAC in place bilaterally.  Sounds like over the last 12 to 24 hours he has had very consistent alarms going off on the one on his right leg.  His significant other has been read taping it to try to help with the alarm however keeps going off they called a phone number and they told him to come here for further evaluation.  Since being here has not gone off.  States that they have tried changing the cartridge out on that side.  They also state that the machine is making an odd noise.       Home Medications Prior to Admission medications   Medication Sig Start Date End Date Taking? Authorizing Provider  acetaminophen (TYLENOL) 325 MG tablet Take 1-2 tablets (325-650 mg total) by mouth every 6 (six) hours as needed for mild pain (pain score 1-3 or temp > 100.5). Patient not taking: Reported on 08/06/2021 08/01/21   Milinda Antis, PA-C  amoxicillin-clavulanate (AUGMENTIN) 875-125 MG tablet Take 1 tablet by mouth every 12 (twelve) hours. 08/01/21   Setzer, Lynnell Jude, PA-C  apixaban (ELIQUIS) 5 MG TABS tablet Take 1 tablet (5 mg total) by mouth every 12 (twelve) hours. 07/12/21     ascorbic acid (VITAMIN C) 1000 MG tablet Take 1 tablet (1,000 mg total) by mouth daily. Patient not taking: Reported on 08/06/2021 06/08/21   Montez Morita, PA-C  calcium citrate (CALCITRATE - DOSED IN MG ELEMENTAL CALCIUM) 950 (200 Ca) MG tablet Take 1 tablet (200 mg of elemental calcium total) by mouth 2 (two) times daily. Patient not taking: Reported on 08/06/2021 06/07/21 08/06/21  Montez Morita, PA-C  Cholecalciferol 125 MCG (5000 UT) TABS  Take 1 tablet by mouth daily. Patient not taking: Reported on 08/06/2021 06/07/21   Montez Morita, PA-C  cyclobenzaprine (FLEXERIL) 5 MG tablet Take 1-2 tablets (5-10 mg total) by mouth 3 (three) times daily as needed for muscle spasms. 08/01/21   Setzer, Lynnell Jude, PA-C  ferrous sulfate 325 (65 FE) MG tablet Take 1 tablet (325 mg total) by mouth 3 (three) times daily after meals. Patient not taking: Reported on 08/06/2021 08/01/21   Milinda Antis, PA-C  ibuprofen (ADVIL) 200 MG tablet Take 600 mg by mouth every 8 (eight) hours as needed (BACK PAIN.).    [provider]  magnesium gluconate (MAGONATE) 500 MG tablet Take 0.5 tablets (250 mg total) by mouth at bedtime. Patient not taking: Reported on 08/06/2021 08/01/21   Milinda Antis, PA-C  Multiple Vitamin (MULTIVITAMIN WITH MINERALS) TABS tablet Take 1 tablet by mouth daily. Patient not taking: Reported on 08/15/2021 08/01/21   Milinda Antis, PA-C  nutrition supplement, JUVEN, (JUVEN) PACK Take 1 packet by mouth 2 (two) times daily between meals. Patient not taking: Reported on 08/06/2021 08/01/21   Milinda Antis, PA-C  oxyCODONE-acetaminophen (PERCOCET/ROXICET) 5-325 MG tablet Take 1-2 tablets by mouth every 12 (twelve) hours as needed for severe pain Patient taking differently: Take 1-2 tablets by mouth every 12 (twelve) hours as needed for moderate pain or severe pain. 07/12/21  propranolol (INDERAL) 10 MG tablet Take 1 tablet (10 mg total) by mouth 2 (two) times daily. 08/01/21   Setzer, Lynnell Jude, PA-C  PROTEIN PO Take by mouth daily. Protein shake    [provider]  senna-docusate (SENOKOT-S) 8.6-50 MG tablet Take 2 tablets by mouth 2 (two) times daily. Patient not taking: Reported on 08/06/2021 08/01/21   Milinda Antis, PA-C      Allergies    Bee venom    Review of Systems   Review of Systems  Physical Exam Updated Vital Signs BP 100/70 (BP Location: Right Arm)    Pulse 96    Temp 98.1 F (36.7 C) (Oral)     Resp 18    SpO2 100%  Physical Exam Vitals and nursing note reviewed.  Constitutional:      Appearance: He is well-developed.  HENT:     Head: Normocephalic and atraumatic.     Mouth/Throat:     Mouth: Mucous membranes are moist.     Pharynx: Oropharynx is clear.  Eyes:     Pupils: Pupils are equal, round, and reactive to light.  Cardiovascular:     Rate and Rhythm: Normal rate.  Pulmonary:     Effort: Pulmonary effort is normal. No respiratory distress.  Abdominal:     General: Abdomen is flat. There is no distension.  Musculoskeletal:        General: No swelling or tenderness. Normal range of motion.     Cervical back: Normal range of motion.  Skin:    General: Skin is warm and dry.     Comments: Bilateral wound vacs in place.  The one on the right was inspected more closely and the vacuum seems to be working at this time.  Patient was observed for over an hour with a nurse who had wound care experience that evaluated it and does not notice any leaks or other issues with the wound VAC itself.  The wound VAC has been consistently between 100-150 millimeters of mercury pressure similar to the left side.  Family states that the drainage out of the wound appears to be normal.  Neurological:     General: No focal deficit present.     Mental Status: He is alert.    ED Results / Procedures / Treatments   Labs (all labs ordered are listed, but only abnormal results are displayed) Labs Reviewed - No data to display  EKG None  Radiology No results found.  Procedures Procedures   Medications Ordered in ED Medications - No data to display  ED Course/ Medical Decision Making/ A&P                           Medical Decision Making  Bilateral wound vacs in place.  The one on the right was inspected more closely and the vacuum seems to be working at this time.  Patient was observed for over an hour with a nurse who had wound care experience that evaluated it and does not notice  any leaks or other issues with the wound VAC itself.  The wound VAC has been consistently between 100-150 millimeters of mercury pressure similar to the left side.  Family states that the drainage out of the wound appears to be normal. Will follow up with surgeon on Monday as scheduled, otherwise return here for new/worsening/recurrent symptoms.   Final Clinical Impression(s) / ED Diagnoses Final diagnoses:  Encounter for management of wound VAC  Rx / DC Orders ED Discharge Orders     None         Brigitt Mcclish, Barbara Cower, MD 08/18/21 [phone]

## 2021-08-19 ENCOUNTER — Other Ambulatory Visit: Payer: Self-pay

## 2021-08-19 ENCOUNTER — Ambulatory Visit (HOSPITAL_COMMUNITY): Payer: PRIVATE HEALTH INSURANCE | Admitting: Anesthesiology

## 2021-08-19 ENCOUNTER — Encounter (HOSPITAL_COMMUNITY)
Admission: RE | Disposition: A | Discharge status: Home / Self Care | Payer: Self-pay | Source: Home / Self Care | Attending: Plastic Surgery

## 2021-08-19 ENCOUNTER — Ambulatory Visit (HOSPITAL_COMMUNITY)
Admission: RE | Admit: 2021-08-19 | Discharge: 2021-08-19 | Disposition: A | Discharge status: Home / Self Care | Payer: PRIVATE HEALTH INSURANCE | Attending: Plastic Surgery | Admitting: Plastic Surgery

## 2021-08-19 ENCOUNTER — Telehealth: Payer: Self-pay | Admitting: Plastic Surgery

## 2021-08-19 ENCOUNTER — Encounter (HOSPITAL_COMMUNITY): Payer: Self-pay | Admitting: Plastic Surgery

## 2021-08-19 DIAGNOSIS — Z86718 Personal history of other venous thrombosis and embolism: Secondary | ICD-10-CM | POA: Insufficient documentation

## 2021-08-19 DIAGNOSIS — D759 Disease of blood and blood-forming organs, unspecified: Secondary | ICD-10-CM | POA: Diagnosis not present

## 2021-08-19 DIAGNOSIS — Z7901 Long term (current) use of anticoagulants: Secondary | ICD-10-CM | POA: Diagnosis not present

## 2021-08-19 DIAGNOSIS — W231XXA Caught, crushed, jammed, or pinched between stationary objects, initial encounter: Secondary | ICD-10-CM | POA: Diagnosis not present

## 2021-08-19 DIAGNOSIS — G709 Myoneural disorder, unspecified: Secondary | ICD-10-CM | POA: Insufficient documentation

## 2021-08-19 DIAGNOSIS — D649 Anemia, unspecified: Secondary | ICD-10-CM | POA: Diagnosis not present

## 2021-08-19 DIAGNOSIS — S71101D Unspecified open wound, right thigh, subsequent encounter: Secondary | ICD-10-CM | POA: Diagnosis not present

## 2021-08-19 DIAGNOSIS — S71102D Unspecified open wound, left thigh, subsequent encounter: Secondary | ICD-10-CM | POA: Diagnosis not present

## 2021-08-19 DIAGNOSIS — S71102A Unspecified open wound, left thigh, initial encounter: Secondary | ICD-10-CM | POA: Insufficient documentation

## 2021-08-19 HISTORY — PX: INCISION AND DRAINAGE OF WOUND: SHX1803

## 2021-08-19 HISTORY — PX: APPLICATION OF WOUND VAC: SHX5189

## 2021-08-19 SURGERY — IRRIGATION AND DEBRIDEMENT WOUND
Anesthesia: General | Site: Thigh | Laterality: Bilateral

## 2021-08-19 MED ORDER — MIDAZOLAM HCL 2 MG/2ML IJ SOLN
INTRAMUSCULAR | Status: AC
  Filled 2021-08-19: qty 2

## 2021-08-19 MED ORDER — ACETAMINOPHEN 500 MG PO TABS
1000.0000 mg | ORAL_TABLET | Freq: Once | ORAL | Status: AC
  Administered 2021-08-19: 1000 mg via ORAL
  Filled 2021-08-19: qty 2

## 2021-08-19 MED ORDER — DEXAMETHASONE SODIUM PHOSPHATE 10 MG/ML IJ SOLN
INTRAMUSCULAR | Status: DC | PRN
  Administered 2021-08-19: 10 mg via INTRAVENOUS

## 2021-08-19 MED ORDER — FENTANYL CITRATE (PF) 250 MCG/5ML IJ SOLN
INTRAMUSCULAR | Status: AC
  Filled 2021-08-19: qty 5

## 2021-08-19 MED ORDER — CHLORHEXIDINE GLUCONATE 0.12 % MT SOLN
OROMUCOSAL | Status: AC
  Administered 2021-08-19: 15 mL
  Filled 2021-08-19: qty 15

## 2021-08-19 MED ORDER — PROPOFOL 10 MG/ML IV BOLUS
INTRAVENOUS | Status: AC
  Filled 2021-08-19: qty 20

## 2021-08-19 MED ORDER — CHLORHEXIDINE GLUCONATE CLOTH 2 % EX PADS: 6.0000 | MEDICATED_PAD | Freq: Once | CUTANEOUS | Status: DC

## 2021-08-19 MED ORDER — MIDAZOLAM HCL 2 MG/2ML IJ SOLN
INTRAMUSCULAR | Status: DC | PRN
  Administered 2021-08-19: 2 mg via INTRAVENOUS

## 2021-08-19 MED ORDER — CEFAZOLIN SODIUM-DEXTROSE 2-4 GM/100ML-% IV SOLN
2.0000 g | INTRAVENOUS | Status: AC
  Administered 2021-08-19: 2 g via INTRAVENOUS
  Filled 2021-08-19: qty 100

## 2021-08-19 MED ORDER — FENTANYL CITRATE (PF) 100 MCG/2ML IJ SOLN
INTRAMUSCULAR | Status: DC | PRN
  Administered 2021-08-19: 50 ug via INTRAVENOUS
  Administered 2021-08-19: 100 ug via INTRAVENOUS

## 2021-08-19 MED ORDER — ONDANSETRON HCL 4 MG/2ML IJ SOLN
INTRAMUSCULAR | Status: DC | PRN
  Administered 2021-08-19: 4 mg via INTRAVENOUS

## 2021-08-19 MED ORDER — LACTATED RINGERS IV SOLN
INTRAVENOUS | Status: DC | PRN
Start: 2021-08-19 — End: 2021-08-19

## 2021-08-19 MED ORDER — CHLORHEXIDINE GLUCONATE 0.12 % MT SOLN: 15.0000 mL | Freq: Once | OROMUCOSAL | Status: AC

## 2021-08-19 MED ORDER — LACTATED RINGERS IV SOLN: INTRAVENOUS | Status: DC

## 2021-08-19 MED ORDER — 0.9 % SODIUM CHLORIDE (POUR BTL) OPTIME
TOPICAL | Status: DC | PRN
  Administered 2021-08-19: 1000 mL

## 2021-08-19 MED ORDER — LIDOCAINE-EPINEPHRINE 1 %-1:100000 IJ SOLN
INTRAMUSCULAR | Status: AC
  Filled 2021-08-19: qty 1

## 2021-08-19 MED ORDER — LIDOCAINE 2% (20 MG/ML) 5 ML SYRINGE
INTRAMUSCULAR | Status: DC | PRN
  Administered 2021-08-19: 80 mg via INTRAVENOUS

## 2021-08-19 MED ORDER — ORAL CARE MOUTH RINSE: 15.0000 mL | Freq: Once | OROMUCOSAL | Status: AC

## 2021-08-19 MED ORDER — SCOPOLAMINE 1 MG/3DAYS TD PT72
1.0000 | MEDICATED_PATCH | TRANSDERMAL | Status: DC
  Administered 2021-08-19: 1.5 mg via TRANSDERMAL
  Filled 2021-08-19: qty 1

## 2021-08-19 MED ORDER — PROPOFOL 10 MG/ML IV BOLUS
INTRAVENOUS | Status: DC | PRN
  Administered 2021-08-19: 100 mg via INTRAVENOUS
  Administered 2021-08-19: 200 mg via INTRAVENOUS

## 2021-08-19 SURGICAL SUPPLY — 60 items
APPLICATOR COTTON TIP 6 STRL (MISCELLANEOUS) IMPLANT
APPLICATOR COTTON TIP 6IN STRL (MISCELLANEOUS)
BAG COUNTER SPONGE SURGICOUNT (BAG) ×2 IMPLANT
BAG DECANTER FOR FLEXI CONT (MISCELLANEOUS) IMPLANT
BENZOIN TINCTURE PRP APPL 2/3 (GAUZE/BANDAGES/DRESSINGS) ×2 IMPLANT
CANISTER SUCT 3000ML PPV (MISCELLANEOUS) ×2 IMPLANT
CNTNR URN SCR LID CUP LEK RST (MISCELLANEOUS) IMPLANT
CONT SPEC 4OZ STRL OR WHT (MISCELLANEOUS)
COVER SURGICAL LIGHT HANDLE (MISCELLANEOUS) ×2 IMPLANT
DRAIN CHANNEL 19F RND (DRAIN) IMPLANT
DRAIN JP 10F RND SILICONE (MISCELLANEOUS) IMPLANT
DRAPE DERMATAC (DRAPES) ×2 IMPLANT
DRAPE HALF SHEET 40X57 (DRAPES) IMPLANT
DRAPE IMP U-DRAPE 54X76 (DRAPES) ×2 IMPLANT
DRAPE INCISE IOBAN 66X45 STRL (DRAPES) IMPLANT
DRAPE LAPAROSCOPIC ABDOMINAL (DRAPES) IMPLANT
DRAPE LAPAROTOMY 100X72 PEDS (DRAPES) ×2 IMPLANT
DRAPE ORTHO SPLIT 77X108 STRL (DRAPES) ×2
DRAPE SURG ORHT 6 SPLT 77X108 (DRAPES) IMPLANT
DRSG ADAPTIC 3X8 NADH LF (GAUZE/BANDAGES/DRESSINGS) IMPLANT
DRSG CUTIMED SORBACT 7X9 (GAUZE/BANDAGES/DRESSINGS) ×2 IMPLANT
DRSG PAD ABDOMINAL 8X10 ST (GAUZE/BANDAGES/DRESSINGS) IMPLANT
DRSG VAC ATS LRG SENSATRAC (GAUZE/BANDAGES/DRESSINGS) IMPLANT
DRSG VAC ATS MED SENSATRAC (GAUZE/BANDAGES/DRESSINGS) IMPLANT
DRSG VAC ATS SM SENSATRAC (GAUZE/BANDAGES/DRESSINGS) IMPLANT
ELECT CAUTERY BLADE 6.4 (BLADE) IMPLANT
ELECT REM PT RETURN 9FT ADLT (ELECTROSURGICAL) ×2
ELECTRODE REM PT RTRN 9FT ADLT (ELECTROSURGICAL) ×1 IMPLANT
GAUZE SPONGE 4X4 12PLY STRL (GAUZE/BANDAGES/DRESSINGS) IMPLANT
GLOVE SURG ENC MOIS LTX SZ6.5 (GLOVE) ×2 IMPLANT
GLOVE SURG ENC TEXT LTX SZ6.5 (GLOVE) ×2 IMPLANT
GOWN STRL REUS W/ TWL LRG LVL3 (GOWN DISPOSABLE) ×3 IMPLANT
GOWN STRL REUS W/TWL LRG LVL3 (GOWN DISPOSABLE) ×3
GRAFT MYRIAD 3 LAYER 7X10 (Graft) ×1 IMPLANT
KIT BASIN OR (CUSTOM PROCEDURE TRAY) ×2 IMPLANT
KIT TURNOVER KIT B (KITS) ×2 IMPLANT
MATRIX WOUND 3-LAYER 5X5 (Tissue) ×1 IMPLANT
MATRIX WOUND 3-LAYER 7X10 (Tissue) ×1 IMPLANT
MICROMATRIX 1000MG (Tissue) ×2 IMPLANT
NDL HYPO 25GX1X1/2 BEV (NEEDLE) ×1 IMPLANT
NEEDLE HYPO 25GX1X1/2 BEV (NEEDLE) ×2 IMPLANT
NS IRRIG 1000ML POUR BTL (IV SOLUTION) ×2 IMPLANT
PACK GENERAL/GYN (CUSTOM PROCEDURE TRAY) ×2 IMPLANT
PACK UNIVERSAL I (CUSTOM PROCEDURE TRAY) ×2 IMPLANT
PAD ARMBOARD 7.5X6 YLW CONV (MISCELLANEOUS) ×4 IMPLANT
POWDER MYRIAD MORCELLS 1000MG (Miscellaneous) ×1 IMPLANT
SOLUTION PARTIC MCRMTRX 1000MG (Tissue) IMPLANT
STAPLER VISISTAT 35W (STAPLE) ×2 IMPLANT
SURGILUBE 2OZ TUBE FLIPTOP (MISCELLANEOUS) IMPLANT
SUT MNCRL AB 3-0 PS2 27 (SUTURE) IMPLANT
SUT MNCRL AB 4-0 PS2 18 (SUTURE) IMPLANT
SUT MON AB 2-0 CT1 36 (SUTURE) IMPLANT
SUT MON AB 5-0 PS2 18 (SUTURE) IMPLANT
SUT VIC AB 5-0 PS2 18 (SUTURE) IMPLANT
SUT VICRYL 3 0 (SUTURE) IMPLANT
SWAB COLLECTION DEVICE MRSA (MISCELLANEOUS) IMPLANT
SWAB CULTURE ESWAB REG 1ML (MISCELLANEOUS) IMPLANT
SYR CONTROL 10ML LL (SYRINGE) ×2 IMPLANT
TOWEL GREEN STERILE (TOWEL DISPOSABLE) ×2 IMPLANT
UNDERPAD 30X36 HEAVY ABSORB (UNDERPADS AND DIAPERS) ×2 IMPLANT

## 2021-08-19 NOTE — Transfer of Care (Signed)
Immediate Anesthesia Transfer of Care Note  Patient: Wayne Hunter  Procedure(s) Performed: IRRIGATION AND DEBRIDEMENT BILATERAL THIGH  WOUND (Bilateral: Thigh) WOUND VAC CHANGE (Bilateral: Thigh) APPLICATION OF MYRIAD and ACELL (Bilateral: Thigh)  Patient Location: PACU  Anesthesia Type:General  Level of Consciousness: drowsy  Airway & Oxygen Therapy: Patient Spontanous Breathing and Patient connected to nasal cannula oxygen  Post-op Assessment: Report given to RN and Post -op Vital signs reviewed and stable  Post vital signs: Reviewed and stable  Last Vitals:  Vitals Value Taken Time  BP    Temp    Pulse 87 08/19/21 1145  Resp 25 08/19/21 1145  SpO2 100 % 08/19/21 1145    Last Pain:  Vitals:   08/19/21 0852  TempSrc:   PainSc: 4       Patients Stated Pain Goal: 0 (09/90/68 9340)  Complications: No notable events documented.

## 2021-08-19 NOTE — Telephone Encounter (Signed)
LVM for wife, Sharyn Lull, regarding next surgery for patient. Dr. Marla Roe wanted him to have another procedure next week. Left details on voicemail and advised her to call back should she have questions or concerns.

## 2021-08-19 NOTE — Anesthesia Procedure Notes (Signed)
Procedure Name: LMA Insertion Date/Time: 08/19/2021 11:04 AM Performed by: Leonor Liv, CRNA Pre-anesthesia Checklist: Patient identified, Suction available, Emergency Drugs available, Patient being monitored and Timeout performed Patient Re-evaluated:Patient Re-evaluated prior to induction Oxygen Delivery Method: Circle system utilized Preoxygenation: Pre-oxygenation with 100% oxygen Induction Type: IV induction Ventilation: Mask ventilation without difficulty LMA: LMA inserted LMA Size: 4.0 Number of attempts: 1 Placement Confirmation: positive ETCO2 and breath sounds checked- equal and bilateral Tube secured with: Tape Dental Injury: Dental damage  Comments: LMA per SRNA x1

## 2021-08-19 NOTE — Anesthesia Preprocedure Evaluation (Addendum)
Anesthesia Evaluation  Patient identified by MRN, date of birth, ID band Patient awake    Reviewed: Allergy & Precautions, NPO status , Patient's Chart, lab work & pertinent test results  Airway Mallampati: I  TM Distance: >3 FB Neck ROM: Full    Dental no notable dental hx. (+) Teeth Intact, Dental Advisory Given   Pulmonary neg pulmonary ROS,    Pulmonary exam normal breath sounds clear to auscultation       Cardiovascular + DVT (eliquis)  Normal cardiovascular exam Rhythm:Regular Rate:Tachycardia     Neuro/Psych  Neuromuscular disease negative psych ROS   GI/Hepatic negative GI ROS, Neg liver ROS,   Endo/Other  negative endocrine ROS  Renal/GU negative Renal ROS  negative genitourinary   Musculoskeletal Bilateral open thigh wounds due to crush injury, S/P multiple I&D's Rhabdomyolysisi   Abdominal   Peds  Hematology  (+) Blood dyscrasia, anemia ,   Anesthesia Other Findings Run over by a lawn equipment tractor at the end of November- b/l LE crush injuries  Reproductive/Obstetrics negative OB ROS                             Anesthesia Physical Anesthesia Plan  ASA: 2  Anesthesia Plan: General   Post-op Pain Management:    Induction: Intravenous  PONV Risk Score and Plan: 2 and Treatment may vary due to age or medical condition, Midazolam, Scopolamine patch - Pre-op, Ondansetron and Dexamethasone  Airway Management Planned: LMA  Additional Equipment: None  Intra-op Plan:   Post-operative Plan: Extubation in OR  Informed Consent: I have reviewed the patients History and Physical, chart, labs and discussed the procedure including the risks, benefits and alternatives for the proposed anesthesia with the patient or authorized representative who has indicated his/her understanding and acceptance.     Dental advisory given  Plan Discussed with: CRNA, Anesthesiologist and  Surgeon  Anesthesia Plan Comments:        Anesthesia Quick Evaluation

## 2021-08-19 NOTE — Anesthesia Postprocedure Evaluation (Signed)
Anesthesia Post Note  Patient: Wayne Hunter  Procedure(s) Performed: IRRIGATION AND DEBRIDEMENT BILATERAL THIGH  WOUND (Bilateral: Thigh) WOUND VAC CHANGE (Bilateral: Thigh) APPLICATION OF MYRIAD and ACELL (Bilateral: Thigh)     Patient location during evaluation: PACU Anesthesia Type: General Level of consciousness: awake and alert Pain management: pain level controlled Vital Signs Assessment: post-procedure vital signs reviewed and stable Respiratory status: spontaneous breathing, nonlabored ventilation and respiratory function stable Cardiovascular status: blood pressure returned to baseline and stable Postop Assessment: no apparent nausea or vomiting Anesthetic complications: no   No notable events documented.  Last Vitals:  Vitals:   08/19/21 1200 08/19/21 1215  BP: 121/66 119/70  Pulse: 98 81  Resp: (!) 22 17  Temp:  36.6 C  SpO2: 99% 100%    Last Pain:  Vitals:   08/19/21 1215  TempSrc:   PainSc: 0-No pain                 Merlinda Frederick

## 2021-08-19 NOTE — Op Note (Signed)
DATE OF OPERATION: 08/19/2021 ° °LOCATION: [facility] ° °PREOPERATIVE DIAGNOSIS: bilateral thigh wounds ° °POSTOPERATIVE DIAGNOSIS: Same ° °PROCEDURE:  ° Preparation of right thigh wound 8 x 15 cm for placement of Acell 7 x 10 cm and 5 x 5 cm sheet and 1 gm powder °2.    Placement of VAC to right thigh °3.    Preparation of left thigh wound 8 x 11 cm for placement of Myriad 7 x 10 cm sheet and 1 gm powder °4     Placement of VAC to left thigh ° °SURGEON: Claire Sanger Dillingham, DO ° °ASSISTANT: Matthew Scheeler, PA ° °EBL: none ° °CONDITION: Stable ° °COMPLICATIONS: None ° °INDICATION: The patient, Wayne Hunter, is a 25 y.o. male born on 05/25/1996, is here for treatment of bilateral thigh wounds.  ° °PROCEDURE DETAILS:  °The patient was seen prior to surgery and marked.  The IV antibiotics were given. The patient was taken to the operating room and given a general anesthetic. A standard time out was performed and all information was confirmed by those in the room. SCDs were placed.   The thighs were prepped and draped.  The wounds were irrigated with saline.   ° °Right thigh:  The 8 x 15 cm wound was irrigated with saline.  All of the Acell sheet and powder was applied and secured with the 5-0 Vicryl.  The sorbact was secured and the vac was applied with the KY gel. There was an excellent seal.  ° °Left thigh:  The 8 x 11 cm wound was irrigated with saline.  All of the Myriad powder and sheet was applied and secured with the 5-0 Vicryl.  The sorbact was secured and the vac was applied with the KY gel.  There was an excellent seal.  The patient was allowed to wake up and taken to recovery room in stable condition at the end of the case. The family was notified at the end of the case.  ° °The advanced practice practitioner (APP) assisted throughout the case.  The APP was essential in retraction and counter traction when needed to make the case progress smoothly.  This retraction and assistance made it  possible to see the tissue plans for the procedure.  The assistance was needed for blood control, tissue re-approximation and assisted with closure of the incision site. ° °

## 2021-08-19 NOTE — Interval H&P Note (Signed)
History and Physical Interval Note:  08/19/2021 10:35 AM  Wayne Hunter  has presented today for surgery, with the diagnosis of Bilateral Thigh Wounds.  The various methods of treatment have been discussed with the patient and family. After consideration of risks, benefits and other options for treatment, the patient has consented to  Procedure(s) with comments: [place] (Bilateral) - 1 hour WOUND VAC CHANGE (Bilateral) APPLICATION OF MYRIAD OR ACELL (Bilateral) as a surgical intervention.  The patient's history has been reviewed, patient examined, no change in status, stable for surgery.  I have reviewed the patient's chart and labs.  Questions were answered to the patient's satisfaction.     [name]

## 2021-08-19 NOTE — Discharge Instructions (Signed)
Continue with the VAC at 125 mm Hg pressure continuous at home.

## 2021-08-20 ENCOUNTER — Encounter (HOSPITAL_COMMUNITY): Payer: Self-pay | Admitting: Plastic Surgery

## 2021-08-22 ENCOUNTER — Telehealth: Payer: Self-pay | Admitting: *Deleted

## 2021-08-22 NOTE — Telephone Encounter (Signed)
Received V.A.C Wound Progress Tracking from [facility].  Requesting monthly wound assessments for re-authorization of V.A.C. Therapy.  Given to provider to complete.    V.A.C Wound Progress Trackling completed and faxed back to [facility].  Confirmation received and copy scanned into the chart.//AB/CMA

## 2021-08-29 ENCOUNTER — Ambulatory Visit (HOSPITAL_BASED_OUTPATIENT_CLINIC_OR_DEPARTMENT_OTHER): Payer: PRIVATE HEALTH INSURANCE | Admitting: Anesthesiology

## 2021-08-29 ENCOUNTER — Other Ambulatory Visit: Payer: Self-pay

## 2021-08-29 ENCOUNTER — Encounter (HOSPITAL_BASED_OUTPATIENT_CLINIC_OR_DEPARTMENT_OTHER): Payer: Self-pay | Admitting: Plastic Surgery

## 2021-08-29 ENCOUNTER — Ambulatory Visit (HOSPITAL_BASED_OUTPATIENT_CLINIC_OR_DEPARTMENT_OTHER)
Admission: RE | Admit: 2021-08-29 | Discharge: 2021-08-29 | Disposition: A | Discharge status: Home / Self Care | Payer: PRIVATE HEALTH INSURANCE | Attending: Plastic Surgery | Admitting: Plastic Surgery

## 2021-08-29 ENCOUNTER — Encounter (HOSPITAL_BASED_OUTPATIENT_CLINIC_OR_DEPARTMENT_OTHER)
Admission: RE | Disposition: A | Discharge status: Home / Self Care | Payer: Self-pay | Source: Home / Self Care | Attending: Plastic Surgery

## 2021-08-29 DIAGNOSIS — S71101D Unspecified open wound, right thigh, subsequent encounter: Secondary | ICD-10-CM | POA: Diagnosis not present

## 2021-08-29 DIAGNOSIS — E669 Obesity, unspecified: Secondary | ICD-10-CM | POA: Diagnosis not present

## 2021-08-29 DIAGNOSIS — S71101A Unspecified open wound, right thigh, initial encounter: Secondary | ICD-10-CM | POA: Insufficient documentation

## 2021-08-29 DIAGNOSIS — S71102A Unspecified open wound, left thigh, initial encounter: Secondary | ICD-10-CM | POA: Diagnosis not present

## 2021-08-29 DIAGNOSIS — Z6830 Body mass index (BMI) 30.0-30.9, adult: Secondary | ICD-10-CM | POA: Diagnosis not present

## 2021-08-29 DIAGNOSIS — Z86718 Personal history of other venous thrombosis and embolism: Secondary | ICD-10-CM | POA: Diagnosis not present

## 2021-08-29 DIAGNOSIS — S71102D Unspecified open wound, left thigh, subsequent encounter: Secondary | ICD-10-CM | POA: Diagnosis not present

## 2021-08-29 HISTORY — PX: APPLICATION OF WOUND VAC: SHX5189

## 2021-08-29 HISTORY — PX: INCISION AND DRAINAGE OF WOUND: SHX1803

## 2021-08-29 SURGERY — IRRIGATION AND DEBRIDEMENT WOUND
Anesthesia: General | Site: Thigh | Laterality: Bilateral

## 2021-08-29 MED ORDER — PHENYLEPHRINE 40 MCG/ML (10ML) SYRINGE FOR IV PUSH (FOR BLOOD PRESSURE SUPPORT)
PREFILLED_SYRINGE | INTRAVENOUS | Status: AC
  Filled 2021-08-29: qty 10

## 2021-08-29 MED ORDER — CEFAZOLIN SODIUM-DEXTROSE 2-4 GM/100ML-% IV SOLN
2.0000 g | INTRAVENOUS | Status: AC
  Administered 2021-08-29: 2 g via INTRAVENOUS

## 2021-08-29 MED ORDER — SODIUM CHLORIDE 0.9% FLUSH: 3.0000 mL | Freq: Two times a day (BID) | INTRAVENOUS | Status: DC

## 2021-08-29 MED ORDER — ACETAMINOPHEN 325 MG RE SUPP: 650.0000 mg | RECTAL | Status: DC | PRN

## 2021-08-29 MED ORDER — OXYCODONE HCL 5 MG PO TABS
5.0000 mg | ORAL_TABLET | Freq: Once | ORAL | Status: AC | PRN
  Administered 2021-08-29: 5 mg via ORAL

## 2021-08-29 MED ORDER — HYDROMORPHONE HCL 1 MG/ML IJ SOLN: 0.2500 mg | INTRAMUSCULAR | Status: DC | PRN

## 2021-08-29 MED ORDER — LIDOCAINE 2% (20 MG/ML) 5 ML SYRINGE
INTRAMUSCULAR | Status: AC
  Filled 2021-08-29: qty 5

## 2021-08-29 MED ORDER — ONDANSETRON HCL 4 MG/2ML IJ SOLN
INTRAMUSCULAR | Status: DC | PRN
  Administered 2021-08-29: 4 mg via INTRAVENOUS

## 2021-08-29 MED ORDER — CEFAZOLIN SODIUM-DEXTROSE 2-4 GM/100ML-% IV SOLN
INTRAVENOUS | Status: AC
  Filled 2021-08-29: qty 100

## 2021-08-29 MED ORDER — PROPOFOL 10 MG/ML IV BOLUS
INTRAVENOUS | Status: DC | PRN
  Administered 2021-08-29: 300 mg via INTRAVENOUS

## 2021-08-29 MED ORDER — SODIUM CHLORIDE 0.9% FLUSH: 3.0000 mL | INTRAVENOUS | Status: DC | PRN

## 2021-08-29 MED ORDER — DROPERIDOL 2.5 MG/ML IJ SOLN
INTRAMUSCULAR | Status: DC | PRN
  Administered 2021-08-29: .625 mg via INTRAVENOUS

## 2021-08-29 MED ORDER — CHLORHEXIDINE GLUCONATE CLOTH 2 % EX PADS: 6.0000 | MEDICATED_PAD | Freq: Once | CUTANEOUS | Status: DC

## 2021-08-29 MED ORDER — OXYCODONE HCL 5 MG/5ML PO SOLN: 5.0000 mg | Freq: Once | ORAL | Status: AC | PRN

## 2021-08-29 MED ORDER — PROMETHAZINE HCL 25 MG/ML IJ SOLN: 6.2500 mg | INTRAMUSCULAR | Status: DC | PRN

## 2021-08-29 MED ORDER — OXYCODONE HCL 5 MG PO TABS: 5.0000 mg | ORAL_TABLET | ORAL | Status: DC | PRN

## 2021-08-29 MED ORDER — MEPERIDINE HCL 25 MG/ML IJ SOLN: 6.2500 mg | INTRAMUSCULAR | Status: DC | PRN

## 2021-08-29 MED ORDER — LIDOCAINE 2% (20 MG/ML) 5 ML SYRINGE
INTRAMUSCULAR | Status: DC | PRN
Start: 2021-08-29 — End: 2021-08-29
  Administered 2021-08-29: 50 mg via INTRAVENOUS

## 2021-08-29 MED ORDER — ONDANSETRON HCL 4 MG/2ML IJ SOLN
INTRAMUSCULAR | Status: AC
  Filled 2021-08-29: qty 2

## 2021-08-29 MED ORDER — MIDAZOLAM HCL 2 MG/2ML IJ SOLN
INTRAMUSCULAR | Status: AC
  Filled 2021-08-29: qty 2

## 2021-08-29 MED ORDER — FENTANYL CITRATE (PF) 100 MCG/2ML IJ SOLN
INTRAMUSCULAR | Status: AC
Start: 2021-08-29 — End: ?
  Filled 2021-08-29: qty 2

## 2021-08-29 MED ORDER — OXYCODONE HCL 5 MG PO TABS
ORAL_TABLET | ORAL | Status: AC
Start: 2021-08-29 — End: ?
  Filled 2021-08-29: qty 1

## 2021-08-29 MED ORDER — FENTANYL CITRATE (PF) 100 MCG/2ML IJ SOLN
INTRAMUSCULAR | Status: DC | PRN
  Administered 2021-08-29: 100 ug via INTRAVENOUS
  Administered 2021-08-29 (×2): 50 ug via INTRAVENOUS

## 2021-08-29 MED ORDER — ATROPINE SULFATE 0.4 MG/ML IV SOLN
INTRAVENOUS | Status: AC
  Filled 2021-08-29: qty 1

## 2021-08-29 MED ORDER — ACETAMINOPHEN 325 MG PO TABS: 650.0000 mg | ORAL_TABLET | ORAL | Status: DC | PRN

## 2021-08-29 MED ORDER — MIDAZOLAM HCL 5 MG/5ML IJ SOLN
INTRAMUSCULAR | Status: DC | PRN
  Administered 2021-08-29: 2 mg via INTRAVENOUS

## 2021-08-29 MED ORDER — SUCCINYLCHOLINE CHLORIDE 200 MG/10ML IV SOSY
PREFILLED_SYRINGE | INTRAVENOUS | Status: AC
  Filled 2021-08-29: qty 10

## 2021-08-29 MED ORDER — SODIUM CHLORIDE 0.9 % IV SOLN: 250.0000 mL | INTRAVENOUS | Status: DC | PRN

## 2021-08-29 MED ORDER — DEXAMETHASONE SODIUM PHOSPHATE 10 MG/ML IJ SOLN
INTRAMUSCULAR | Status: AC
  Filled 2021-08-29: qty 1

## 2021-08-29 MED ORDER — LACTATED RINGERS IV SOLN: INTRAVENOUS | Status: DC

## 2021-08-29 MED ORDER — DEXAMETHASONE SODIUM PHOSPHATE 4 MG/ML IJ SOLN
INTRAMUSCULAR | Status: DC | PRN
Start: 2021-08-29 — End: 2021-08-29
  Administered 2021-08-29: 5 mg via INTRAVENOUS

## 2021-08-29 MED ORDER — FENTANYL CITRATE (PF) 100 MCG/2ML IJ SOLN: 25.0000 ug | INTRAMUSCULAR | Status: DC | PRN

## 2021-08-29 SURGICAL SUPPLY — 79 items
ADH SKN CLS APL DERMABOND .7 (GAUZE/BANDAGES/DRESSINGS)
APL SKNCLS STERI-STRIP NONHPOA (GAUZE/BANDAGES/DRESSINGS)
BAG DECANTER FOR FLEXI CONT (MISCELLANEOUS) IMPLANT
BENZOIN TINCTURE PRP APPL 2/3 (GAUZE/BANDAGES/DRESSINGS) IMPLANT
BLADE HEX COATED 2.75 (ELECTRODE) IMPLANT
BLADE MINI RND TIP GREEN BEAV (BLADE) IMPLANT
BLADE SURG 10 STRL SS (BLADE) IMPLANT
BLADE SURG 15 STRL LF DISP TIS (BLADE) ×1 IMPLANT
BLADE SURG 15 STRL SS (BLADE) ×2
BNDG COHESIVE 4X5 TAN ST LF (GAUZE/BANDAGES/DRESSINGS) IMPLANT
BNDG ELASTIC 4X5.8 VLCR STR LF (GAUZE/BANDAGES/DRESSINGS) IMPLANT
BNDG ELASTIC 6X5.8 VLCR STR LF (GAUZE/BANDAGES/DRESSINGS) IMPLANT
BNDG GAUZE ELAST 4 BULKY (GAUZE/BANDAGES/DRESSINGS) IMPLANT
CANISTER SUCT 1200ML W/VALVE (MISCELLANEOUS) IMPLANT
COVER BACK TABLE 60X90IN (DRAPES) ×2 IMPLANT
COVER MAYO STAND STRL (DRAPES) ×2 IMPLANT
DERMABOND ADVANCED (GAUZE/BANDAGES/DRESSINGS)
DERMABOND ADVANCED .7 DNX12 (GAUZE/BANDAGES/DRESSINGS) IMPLANT
DRAIN PENROSE .5X12 LATEX STL (DRAIN) IMPLANT
DRAPE DERMATAC (DRAPES) ×1 IMPLANT
DRAPE INCISE IOBAN 66X45 STRL (DRAPES) ×1 IMPLANT
DRAPE LAPAROSCOPIC ABDOMINAL (DRAPES) IMPLANT
DRAPE U-SHAPE 76X120 STRL (DRAPES) ×2 IMPLANT
DRSG ADAPTIC 3X8 NADH LF (GAUZE/BANDAGES/DRESSINGS) IMPLANT
DRSG CUTIMED SORBACT 7X9 (GAUZE/BANDAGES/DRESSINGS) ×2 IMPLANT
DRSG EMULSION OIL 3X3 NADH (GAUZE/BANDAGES/DRESSINGS) IMPLANT
DRSG PAD ABDOMINAL 8X10 ST (GAUZE/BANDAGES/DRESSINGS) IMPLANT
DRSG VAC ATS MED SENSATRAC (GAUZE/BANDAGES/DRESSINGS) ×1 IMPLANT
ELECT REM PT RETURN 9FT ADLT (ELECTROSURGICAL) ×2
ELECTRODE REM PT RTRN 9FT ADLT (ELECTROSURGICAL) ×1 IMPLANT
GAUZE SPONGE 4X4 12PLY STRL (GAUZE/BANDAGES/DRESSINGS) ×2 IMPLANT
GAUZE SPONGE 4X4 12PLY STRL LF (GAUZE/BANDAGES/DRESSINGS) IMPLANT
GAUZE XEROFORM 5X9 LF (GAUZE/BANDAGES/DRESSINGS) IMPLANT
GLOVE SURG ENC MOIS LTX SZ6.5 (GLOVE) ×6 IMPLANT
GOWN STRL REUS W/ TWL LRG LVL3 (GOWN DISPOSABLE) ×2 IMPLANT
GOWN STRL REUS W/TWL LRG LVL3 (GOWN DISPOSABLE) ×6
GRAFT MYRIAD 3 LAYER 7X10 (Graft) ×1 IMPLANT
IV NS IRRIG 3000ML ARTHROMATIC (IV SOLUTION) IMPLANT
MANIFOLD NEPTUNE II (INSTRUMENTS) IMPLANT
MATRIX WOUND 3-LAYER 7X10 (Tissue) ×1 IMPLANT
MICROMATRIX 1000MG (Tissue) ×4 IMPLANT
NDL HYPO 25X1 1.5 SAFETY (NEEDLE) IMPLANT
NDL HYPO 27GX1-1/4 (NEEDLE) IMPLANT
NEEDLE HYPO 25X1 1.5 SAFETY (NEEDLE) IMPLANT
NEEDLE HYPO 27GX1-1/4 (NEEDLE) IMPLANT
NS IRRIG 1000ML POUR BTL (IV SOLUTION) ×2 IMPLANT
PACK BASIN DAY SURGERY FS (CUSTOM PROCEDURE TRAY) ×2 IMPLANT
PADDING CAST ABS 3INX4YD NS (CAST SUPPLIES)
PADDING CAST ABS COTTON 3X4 (CAST SUPPLIES) IMPLANT
PENCIL SMOKE EVACUATOR (MISCELLANEOUS) ×2 IMPLANT
PIN SAFETY STERILE (MISCELLANEOUS) IMPLANT
POWDER MYRIAD MORCELLS 1000MG (Miscellaneous) ×1 IMPLANT
SHEET MEDIUM DRAPE 40X70 STRL (DRAPES) IMPLANT
SLEEVE SCD COMPRESS KNEE MED (STOCKING) IMPLANT
SOLUTION PARTIC MCRMTRX 1000MG (Tissue) IMPLANT
SPIKE FLUID TRANSFER (MISCELLANEOUS) IMPLANT
SPONGE T-LAP 18X18 ~~LOC~~+RFID (SPONGE) ×3 IMPLANT
STAPLER VISISTAT 35W (STAPLE) IMPLANT
STOCKINETTE 6  STRL (DRAPES)
STOCKINETTE 6 STRL (DRAPES) IMPLANT
STOCKINETTE IMPERVIOUS LG (DRAPES) IMPLANT
STRIP CLOSURE SKIN 1/2X4 (GAUZE/BANDAGES/DRESSINGS) IMPLANT
SURGILUBE 2OZ TUBE FLIPTOP (MISCELLANEOUS) ×2 IMPLANT
SUT MNCRL AB 4-0 PS2 18 (SUTURE) ×2 IMPLANT
SUT MON AB 3-0 SH 27 (SUTURE)
SUT MON AB 3-0 SH27 (SUTURE) IMPLANT
SUT SILK 3 0 PS 1 (SUTURE) IMPLANT
SUT VIC AB 3-0 FS2 27 (SUTURE) IMPLANT
SUT VICRYL 4-0 PS2 18IN ABS (SUTURE) ×4 IMPLANT
SWAB COLLECTION DEVICE MRSA (MISCELLANEOUS) IMPLANT
SWAB CULTURE ESWAB REG 1ML (MISCELLANEOUS) IMPLANT
SYR BULB IRRIG 60ML STRL (SYRINGE) ×1 IMPLANT
SYR CONTROL 10ML LL (SYRINGE) ×1 IMPLANT
TAPE HYPAFIX 6X30 (GAUZE/BANDAGES/DRESSINGS) IMPLANT
TOWEL GREEN STERILE FF (TOWEL DISPOSABLE) ×3 IMPLANT
TRAY DSU PREP LF (CUSTOM PROCEDURE TRAY) IMPLANT
TUBE CONNECTING 20X1/4 (TUBING) IMPLANT
UNDERPAD 30X36 HEAVY ABSORB (UNDERPADS AND DIAPERS) ×2 IMPLANT
YANKAUER SUCT BULB TIP NO VENT (SUCTIONS) IMPLANT

## 2021-08-29 NOTE — Anesthesia Preprocedure Evaluation (Signed)
Anesthesia Evaluation  Patient identified by MRN, date of birth, ID band Patient awake    Reviewed: Allergy & Precautions, NPO status , Patient's Chart, lab work & pertinent test results  Airway Mallampati: I  TM Distance: >3 FB Neck ROM: Full    Dental no notable dental hx. (+) Teeth Intact, Dental Advisory Given   Pulmonary neg pulmonary ROS,    Pulmonary exam normal breath sounds clear to auscultation       Cardiovascular + DVT (eliquis)  Normal cardiovascular exam Rhythm:Regular Rate:Tachycardia     Neuro/Psych  Neuromuscular disease negative psych ROS   GI/Hepatic negative GI ROS, Neg liver ROS,   Endo/Other  negative endocrine ROS  Renal/GU negative Renal ROS  negative genitourinary   Musculoskeletal Bilateral open thigh wounds due to crush injury, S/P multiple I&D's Rhabdomyolysisi   Abdominal (+) + obese,   Peds  Hematology  (+) Blood dyscrasia, anemia ,   Anesthesia Other Findings Run over by a lawn equipment tractor at the end of November- b/l LE crush injuries  Reproductive/Obstetrics negative OB ROS                             Anesthesia Physical  Anesthesia Plan  ASA: 2  Anesthesia Plan: General   Post-op Pain Management:    Induction: Intravenous  PONV Risk Score and Plan: 2 and Treatment may vary due to age or medical condition, Ondansetron and Droperidol  Airway Management Planned: LMA  Additional Equipment: None  Intra-op Plan:   Post-operative Plan: Extubation in OR  Informed Consent: I have reviewed the patients History and Physical, chart, labs and discussed the procedure including the risks, benefits and alternatives for the proposed anesthesia with the patient or authorized representative who has indicated his/her understanding and acceptance.     Dental advisory given  Plan Discussed with: CRNA, Anesthesiologist and Surgeon  Anesthesia Plan  Comments:         Anesthesia Quick Evaluation

## 2021-08-29 NOTE — Transfer of Care (Signed)
Immediate Anesthesia Transfer of Care Note  Patient: Wayne Hunter  Procedure(s) Performed: IRRIGATION AND DEBRIDEMENT BILATERAL THIGH WOUND (Bilateral: Thigh) WOUND VAC Change (Bilateral: Thigh) APPLICATION OF MYRIAD or ACELL (Bilateral: Thigh)  Patient Location: PACU  Anesthesia Type:General  Level of Consciousness: awake, alert , oriented, drowsy and patient cooperative  Airway & Oxygen Therapy: Patient Spontanous Breathing and Patient connected to face mask oxygen  Post-op Assessment: Report given to RN and Post -op Vital signs reviewed and stable  Post vital signs: Reviewed and stable  Last Vitals:  Vitals Value Taken Time  BP 127/83 08/29/21 1425  Temp    Pulse 104 08/29/21 1426  Resp 10 08/29/21 1426  SpO2 95 % 08/29/21 1426  Vitals shown include unvalidated device data.  Last Pain:  Vitals:   08/29/21 1229  TempSrc: Oral  PainSc: 3       Patients Stated Pain Goal: 3 (09/92/78 0044)  Complications: No notable events documented.

## 2021-08-29 NOTE — Anesthesia Postprocedure Evaluation (Signed)
Anesthesia Post Note  Patient: Wayne Hunter  Procedure(s) Performed: IRRIGATION AND DEBRIDEMENT BILATERAL THIGH WOUND (Bilateral: Thigh) WOUND VAC Change (Bilateral: Thigh) APPLICATION OF MYRIAD or ACELL (Bilateral: Thigh)     Patient location during evaluation: PACU Anesthesia Type: General Level of consciousness: awake and alert Pain management: pain level controlled Vital Signs Assessment: post-procedure vital signs reviewed and stable Respiratory status: spontaneous breathing, nonlabored ventilation and respiratory function stable Cardiovascular status: blood pressure returned to baseline and stable Postop Assessment: no apparent nausea or vomiting Anesthetic complications: no   No notable events documented.  Last Vitals:  Vitals:   08/29/21 1445 08/29/21 1502  BP: 128/76 114/72  Pulse: 95 89  Resp: 20 16  Temp:  36.6 C  SpO2: 99% 100%    Last Pain:  Vitals:   08/29/21 1510  TempSrc:   PainSc: [place]

## 2021-08-29 NOTE — Discharge Instructions (Addendum)
°  Post Anesthesia Home Care Instructions  Activity: Get plenty of rest for the remainder of the day. A responsible individual must stay with you for 24 hours following the procedure.  For the next 24 hours, DO NOT: -Drive a car -Advertising copywriter -Drink alcoholic beverages -Take any medication unless instructed by your physician -Make any legal decisions or sign important papers.  Meals: Start with liquid foods such as gelatin or soup. Progress to regular foods as tolerated. Avoid greasy, spicy, heavy foods. If nausea and/or vomiting occur, drink only clear liquids until the nausea and/or vomiting subsides. Call your physician if vomiting continues.  Special Instructions/Symptoms: Your throat may feel dry or sore from the anesthesia or the breathing tube placed in your throat during surgery. If this causes discomfort, gargle with warm salt water. The discomfort should disappear within 24 hours.  If you had a scopolamine patch placed behind your ear for the management of post- operative nausea and/or vomiting:  1. The medication in the patch is effective for 72 hours, after which it should be removed.  Wrap patch in a tissue and discard in the trash. Wash hands thoroughly with soap and water. 2. You may remove the patch earlier than 72 hours if you experience unpleasant side effects which may include dry mouth, dizziness or visual disturbances. 3. Avoid touching the patch. Wash your hands with soap and water after contact with the patch.  Keep VAC at 125 mmHg pressure continuous

## 2021-08-29 NOTE — Op Note (Signed)
DATE OF OPERATION: 08/29/2021  LOCATION: [facility]  PREOPERATIVE DIAGNOSIS: bilateral thigh wounds  POSTOPERATIVE DIAGNOSIS: Same  PROCEDURE:  Preparation of Left thigh wound 4 x 10 cm for placement of Myriad 7 x 10 cm and 1 gm Placement of VAC to left thigh wound Excision of right thigh soft tissue 2 x 10 cm Preparation of right thigh wound 6 x 11 x 4 cm for placement of Acell 7 x 10 cm and 2 gm Placement of right thigh wound VAC  SURGEON: Jerritt Cardoza Sanger Yancy Hascall, DO  ASSISTANT: Rodman Key Visual merchandiser, PA  EBL: 5 cc  CONDITION: Stable  COMPLICATIONS: None  INDICATION: The patient, Wayne Hunter, is a 26 y.o. male born on 03/18/1996, is here for treatment of bilateral thigh wounds from an accident at work.   PROCEDURE DETAILS:  The patient was seen prior to surgery and marked.  The IV antibiotics were given. The patient was taken to the operating room and given a general anesthetic. A standard time out was performed and all information was confirmed by those in the room.  Bilateral thighs were prepped and draped. The wounds were irrigated with saline.   Left:  The left thigh was irrigated with saline.  All of the myriad powder and sheet was applied and secured to the 4 x 10 cm wound with the 5-0 Vicryl.  The Sorbact was applied with KY gel and secured with the 5-0 Vicryl.  The VAC sponge was applied and had an excellent seal.  There was an excellent seal.    Right:  The #10 blade was used to excise 2 x 10 cm of soft tissue that was hypergranulated.  Hemostasis was achieved with electrocautery.  All of the Acell powder was applied to the 6 x 11 cm wound.  All of the Acell sheet was applied and secured with the 5-0 Vicryl.  The sorbact was applied and secured with the 5-0 Vicryl.   The VAC was placed with KY gel and there was an excellent seal.  The patient was allowed to wake up and taken to recovery room in stable condition at the end of the case. The family was notified  at the end of the case.   The advanced practice practitioner (APP) assisted throughout the case.  The APP was essential in retraction and counter traction when needed to make the case progress smoothly.  This retraction and assistance made it possible to see the tissue plans for the procedure.  The assistance was needed for blood control, tissue re-approximation and assisted with closure of the incision site.

## 2021-08-29 NOTE — Interval H&P Note (Signed)
History and Physical Interval Note:  08/29/2021 12:39 PM  Wayne Hunter  has presented today for surgery, with the diagnosis of BIlateral Thigh Wounds.  The various methods of treatment have been discussed with the patient and family. After consideration of risks, benefits and other options for treatment, the patient has consented to  Procedure(s) with comments: IRRIGATION AND DEBRIDEMENT BILATERAL THIGH WOUND (Bilateral) - 1 hour WOUND VAC Change (Bilateral) APPLICATION OF MYRIAD or ACELL (Bilateral) as a surgical intervention.  The patient's history has been reviewed, patient examined, no change in status, stable for surgery.  I have reviewed the patient's chart and labs.  Questions were answered to the patient's satisfaction.     [name] Kolbie [place]

## 2021-08-29 NOTE — Anesthesia Procedure Notes (Signed)
Procedure Name: LMA Insertion Date/Time: 08/29/2021 1:26 PM Performed by: Ronnette Hila, CRNA Pre-anesthesia Checklist: Patient identified, Emergency Drugs available, Suction available and Patient being monitored Patient Re-evaluated:Patient Re-evaluated prior to induction Oxygen Delivery Method: Circle system utilized Preoxygenation: Pre-oxygenation with 100% oxygen Induction Type: IV induction Ventilation: Mask ventilation without difficulty LMA: LMA inserted LMA Size: 5.0 Number of attempts: 1 Airway Equipment and Method: Bite block Placement Confirmation: positive ETCO2 Tube secured with: Tape Dental Injury: Teeth and Oropharynx as per pre-operative assessment

## 2021-08-29 NOTE — Interval H&P Note (Signed)
History and Physical Interval Note:  08/29/2021 12:39 PM  Wayne Hunter  has presented today for surgery, with the diagnosis of BIlateral Thigh Wounds.  The various methods of treatment have been discussed with the patient and family. After consideration of risks, benefits and other options for treatment, the patient has consented to  Procedure(s) with comments: IRRIGATION AND DEBRIDEMENT BILATERAL THIGH WOUND (Bilateral) - 1 hour WOUND VAC Change (Bilateral) APPLICATION OF MYRIAD or ACELL (Bilateral) as a surgical intervention.  The patient's history has been reviewed, patient examined, no change in status, stable for surgery.  I have reviewed the patient's chart and labs.  Questions were answered to the patient's satisfaction.     [name] Kivon Aprea

## 2021-08-30 ENCOUNTER — Encounter (HOSPITAL_BASED_OUTPATIENT_CLINIC_OR_DEPARTMENT_OTHER): Payer: Self-pay | Admitting: Plastic Surgery

## 2021-09-02 ENCOUNTER — Other Ambulatory Visit: Payer: Self-pay

## 2021-09-02 ENCOUNTER — Encounter (HOSPITAL_BASED_OUTPATIENT_CLINIC_OR_DEPARTMENT_OTHER): Payer: Self-pay | Admitting: Plastic Surgery

## 2021-09-04 ENCOUNTER — Telehealth: Payer: Self-pay

## 2021-09-04 NOTE — Telephone Encounter (Signed)
Patient's wife called to say she received a phone call from [facility] [phone]) letting them know that the canister for the wound vac will not be delivered tomorrow morning. They can only deliver tomorrow afternoon.

## 2021-09-05 ENCOUNTER — Other Ambulatory Visit: Payer: Self-pay

## 2021-09-05 ENCOUNTER — Ambulatory Visit (HOSPITAL_BASED_OUTPATIENT_CLINIC_OR_DEPARTMENT_OTHER): Payer: PRIVATE HEALTH INSURANCE | Admitting: Anesthesiology

## 2021-09-05 ENCOUNTER — Encounter (HOSPITAL_BASED_OUTPATIENT_CLINIC_OR_DEPARTMENT_OTHER)
Admission: RE | Disposition: A | Discharge status: Home / Self Care | Payer: Self-pay | Source: Home / Self Care | Attending: Plastic Surgery

## 2021-09-05 ENCOUNTER — Ambulatory Visit (HOSPITAL_BASED_OUTPATIENT_CLINIC_OR_DEPARTMENT_OTHER)
Admission: RE | Admit: 2021-09-05 | Discharge: 2021-09-05 | Disposition: A | Discharge status: Home / Self Care | Payer: PRIVATE HEALTH INSURANCE | Attending: Plastic Surgery | Admitting: Plastic Surgery

## 2021-09-05 ENCOUNTER — Encounter (HOSPITAL_BASED_OUTPATIENT_CLINIC_OR_DEPARTMENT_OTHER): Payer: Self-pay | Admitting: Plastic Surgery

## 2021-09-05 DIAGNOSIS — S71101A Unspecified open wound, right thigh, initial encounter: Secondary | ICD-10-CM

## 2021-09-05 DIAGNOSIS — S71102A Unspecified open wound, left thigh, initial encounter: Secondary | ICD-10-CM

## 2021-09-05 DIAGNOSIS — Z7901 Long term (current) use of anticoagulants: Secondary | ICD-10-CM | POA: Insufficient documentation

## 2021-09-05 DIAGNOSIS — S7711XA Crushing injury of right thigh, initial encounter: Secondary | ICD-10-CM | POA: Diagnosis not present

## 2021-09-05 DIAGNOSIS — Z86718 Personal history of other venous thrombosis and embolism: Secondary | ICD-10-CM | POA: Insufficient documentation

## 2021-09-05 DIAGNOSIS — S71102D Unspecified open wound, left thigh, subsequent encounter: Secondary | ICD-10-CM | POA: Diagnosis not present

## 2021-09-05 DIAGNOSIS — S71101D Unspecified open wound, right thigh, subsequent encounter: Secondary | ICD-10-CM | POA: Diagnosis not present

## 2021-09-05 HISTORY — PX: APPLICATION OF WOUND VAC: SHX5189

## 2021-09-05 HISTORY — PX: INCISION AND DRAINAGE OF WOUND: SHX1803

## 2021-09-05 SURGERY — IRRIGATION AND DEBRIDEMENT WOUND
Anesthesia: General | Site: Thigh | Laterality: Bilateral

## 2021-09-05 MED ORDER — OXYCODONE HCL 5 MG PO TABS: 5.0000 mg | ORAL_TABLET | Freq: Once | ORAL | Status: DC | PRN

## 2021-09-05 MED ORDER — ACETAMINOPHEN 325 MG PO TABS: 650.0000 mg | ORAL_TABLET | ORAL | Status: DC | PRN

## 2021-09-05 MED ORDER — SODIUM CHLORIDE 0.9% FLUSH: 3.0000 mL | Freq: Two times a day (BID) | INTRAVENOUS | Status: DC

## 2021-09-05 MED ORDER — LIDOCAINE HCL (CARDIAC) PF 100 MG/5ML IV SOSY
PREFILLED_SYRINGE | INTRAVENOUS | Status: DC | PRN
Start: 2021-09-05 — End: 2021-09-05
  Administered 2021-09-05: 100 mg via INTRAVENOUS

## 2021-09-05 MED ORDER — EPHEDRINE 5 MG/ML INJ
INTRAVENOUS | Status: AC
  Filled 2021-09-05: qty 5

## 2021-09-05 MED ORDER — FENTANYL CITRATE (PF) 100 MCG/2ML IJ SOLN
INTRAMUSCULAR | Status: DC | PRN
  Administered 2021-09-05 (×2): 50 ug via INTRAVENOUS
  Administered 2021-09-05: 100 ug via INTRAVENOUS

## 2021-09-05 MED ORDER — FENTANYL CITRATE (PF) 100 MCG/2ML IJ SOLN: 25.0000 ug | INTRAMUSCULAR | Status: DC | PRN

## 2021-09-05 MED ORDER — ATROPINE SULFATE 0.4 MG/ML IV SOLN
INTRAVENOUS | Status: AC
  Filled 2021-09-05: qty 1

## 2021-09-05 MED ORDER — PROPOFOL 10 MG/ML IV BOLUS
INTRAVENOUS | Status: DC | PRN
  Administered 2021-09-05: 200 mg via INTRAVENOUS
  Administered 2021-09-05: 50 mg via INTRAVENOUS

## 2021-09-05 MED ORDER — OXYCODONE HCL 5 MG/5ML PO SOLN: 5.0000 mg | Freq: Once | ORAL | Status: DC | PRN

## 2021-09-05 MED ORDER — ONDANSETRON HCL 4 MG/2ML IJ SOLN
INTRAMUSCULAR | Status: DC | PRN
  Administered 2021-09-05: 4 mg via INTRAVENOUS

## 2021-09-05 MED ORDER — MIDAZOLAM HCL 2 MG/2ML IJ SOLN
INTRAMUSCULAR | Status: AC
  Filled 2021-09-05: qty 2

## 2021-09-05 MED ORDER — FENTANYL CITRATE (PF) 100 MCG/2ML IJ SOLN
INTRAMUSCULAR | Status: AC
Start: 2021-09-05 — End: ?
  Filled 2021-09-05: qty 2

## 2021-09-05 MED ORDER — DEXAMETHASONE SODIUM PHOSPHATE 4 MG/ML IJ SOLN
INTRAMUSCULAR | Status: DC | PRN
  Administered 2021-09-05: 5 mg via INTRAVENOUS

## 2021-09-05 MED ORDER — CEFAZOLIN SODIUM-DEXTROSE 2-4 GM/100ML-% IV SOLN
INTRAVENOUS | Status: AC
  Filled 2021-09-05: qty 100

## 2021-09-05 MED ORDER — PHENYLEPHRINE 40 MCG/ML (10ML) SYRINGE FOR IV PUSH (FOR BLOOD PRESSURE SUPPORT)
PREFILLED_SYRINGE | INTRAVENOUS | Status: AC
  Filled 2021-09-05: qty 10

## 2021-09-05 MED ORDER — LACTATED RINGERS IV SOLN: INTRAVENOUS | Status: DC

## 2021-09-05 MED ORDER — CEFAZOLIN SODIUM-DEXTROSE 2-4 GM/100ML-% IV SOLN
2.0000 g | INTRAVENOUS | Status: AC
  Administered 2021-09-05: 2 g via INTRAVENOUS

## 2021-09-05 MED ORDER — ONDANSETRON HCL 4 MG/2ML IJ SOLN
INTRAMUSCULAR | Status: AC
  Filled 2021-09-05: qty 2

## 2021-09-05 MED ORDER — SODIUM CHLORIDE 0.9 % IV SOLN: 250.0000 mL | INTRAVENOUS | Status: DC | PRN

## 2021-09-05 MED ORDER — MEPERIDINE HCL 25 MG/ML IJ SOLN: 6.2500 mg | INTRAMUSCULAR | Status: DC | PRN

## 2021-09-05 MED ORDER — PROMETHAZINE HCL 25 MG/ML IJ SOLN: 6.2500 mg | INTRAMUSCULAR | Status: DC | PRN

## 2021-09-05 MED ORDER — FENTANYL CITRATE (PF) 100 MCG/2ML IJ SOLN
INTRAMUSCULAR | Status: AC
  Filled 2021-09-05: qty 2

## 2021-09-05 MED ORDER — SODIUM CHLORIDE 0.9% FLUSH: 3.0000 mL | INTRAVENOUS | Status: DC | PRN

## 2021-09-05 MED ORDER — MIDAZOLAM HCL 5 MG/5ML IJ SOLN
INTRAMUSCULAR | Status: DC | PRN
  Administered 2021-09-05: 2 mg via INTRAVENOUS

## 2021-09-05 MED ORDER — CHLORHEXIDINE GLUCONATE CLOTH 2 % EX PADS: 6.0000 | MEDICATED_PAD | Freq: Once | CUTANEOUS | Status: DC

## 2021-09-05 MED ORDER — LIDOCAINE 2% (20 MG/ML) 5 ML SYRINGE
INTRAMUSCULAR | Status: AC
  Filled 2021-09-05: qty 5

## 2021-09-05 MED ORDER — AMISULPRIDE (ANTIEMETIC) 5 MG/2ML IV SOLN: 10.0000 mg | Freq: Once | INTRAVENOUS | Status: DC | PRN

## 2021-09-05 MED ORDER — 0.9 % SODIUM CHLORIDE (POUR BTL) OPTIME
TOPICAL | Status: DC | PRN
  Administered 2021-09-05: 500 mL

## 2021-09-05 MED ORDER — SUCCINYLCHOLINE CHLORIDE 200 MG/10ML IV SOSY
PREFILLED_SYRINGE | INTRAVENOUS | Status: AC
  Filled 2021-09-05: qty 10

## 2021-09-05 MED ORDER — OXYCODONE HCL 5 MG PO TABS: 5.0000 mg | ORAL_TABLET | ORAL | Status: DC | PRN

## 2021-09-05 MED ORDER — DEXAMETHASONE SODIUM PHOSPHATE 10 MG/ML IJ SOLN
INTRAMUSCULAR | Status: AC
  Filled 2021-09-05: qty 1

## 2021-09-05 MED ORDER — HYDROMORPHONE HCL 1 MG/ML IJ SOLN: 0.2500 mg | INTRAMUSCULAR | Status: DC | PRN

## 2021-09-05 MED ORDER — ACETAMINOPHEN 325 MG RE SUPP: 650.0000 mg | RECTAL | Status: DC | PRN

## 2021-09-05 SURGICAL SUPPLY — 97 items
ADH SKN CLS APL DERMABOND .7 (GAUZE/BANDAGES/DRESSINGS)
APL SKNCLS STERI-STRIP NONHPOA (GAUZE/BANDAGES/DRESSINGS)
BAG DECANTER FOR FLEXI CONT (MISCELLANEOUS) IMPLANT
BENZOIN TINCTURE PRP APPL 2/3 (GAUZE/BANDAGES/DRESSINGS) IMPLANT
BLADE HEX COATED 2.75 (ELECTRODE) IMPLANT
BLADE MINI RND TIP GREEN BEAV (BLADE) IMPLANT
BLADE SURG 10 STRL SS (BLADE) IMPLANT
BLADE SURG 15 STRL LF DISP TIS (BLADE) ×1 IMPLANT
BLADE SURG 15 STRL SS (BLADE) ×2
BNDG COHESIVE 4X5 TAN ST LF (GAUZE/BANDAGES/DRESSINGS) IMPLANT
BNDG ELASTIC 3X5.8 VLCR STR LF (GAUZE/BANDAGES/DRESSINGS) IMPLANT
BNDG ELASTIC 4X5.8 VLCR STR LF (GAUZE/BANDAGES/DRESSINGS) IMPLANT
BNDG ELASTIC 6X5.8 VLCR STR LF (GAUZE/BANDAGES/DRESSINGS) IMPLANT
BNDG GAUZE ELAST 4 BULKY (GAUZE/BANDAGES/DRESSINGS) IMPLANT
CANISTER SUCT 1200ML W/VALVE (MISCELLANEOUS) IMPLANT
CORD BIPOLAR FORCEPS 12FT (ELECTRODE) IMPLANT
COVER BACK TABLE 60X90IN (DRAPES) ×2 IMPLANT
COVER MAYO STAND STRL (DRAPES) ×2 IMPLANT
DERMABOND ADVANCED (GAUZE/BANDAGES/DRESSINGS)
DERMABOND ADVANCED .7 DNX12 (GAUZE/BANDAGES/DRESSINGS) IMPLANT
DRAIN CHANNEL 19F RND (DRAIN) IMPLANT
DRAIN PENROSE .5X12 LATEX STL (DRAIN) IMPLANT
DRAPE DERMATAC (DRAPES) ×2 IMPLANT
DRAPE INCISE IOBAN 66X45 STRL (DRAPES) ×1 IMPLANT
DRAPE LAPAROSCOPIC ABDOMINAL (DRAPES) IMPLANT
DRAPE LAPAROTOMY 100X72 PEDS (DRAPES) IMPLANT
DRAPE U-SHAPE 76X120 STRL (DRAPES) ×2 IMPLANT
DRSG ADAPTIC 3X8 NADH LF (GAUZE/BANDAGES/DRESSINGS) IMPLANT
DRSG CUTIMED SORBACT 7X9 (GAUZE/BANDAGES/DRESSINGS) ×2 IMPLANT
DRSG EMULSION OIL 3X3 NADH (GAUZE/BANDAGES/DRESSINGS) IMPLANT
DRSG PAD ABDOMINAL 8X10 ST (GAUZE/BANDAGES/DRESSINGS) IMPLANT
DRSG VAC ATS LRG SENSATRAC (GAUZE/BANDAGES/DRESSINGS) ×2 IMPLANT
ELECT NDL TIP 2.8 STRL (NEEDLE) IMPLANT
ELECT NEEDLE TIP 2.8 STRL (NEEDLE) IMPLANT
ELECT REM PT RETURN 9FT ADLT (ELECTROSURGICAL) ×2
ELECTRODE REM PT RTRN 9FT ADLT (ELECTROSURGICAL) ×1 IMPLANT
EVACUATOR SILICONE 100CC (DRAIN) IMPLANT
GAUZE SPONGE 4X4 12PLY STRL (GAUZE/BANDAGES/DRESSINGS) ×2 IMPLANT
GAUZE SPONGE 4X4 12PLY STRL LF (GAUZE/BANDAGES/DRESSINGS) IMPLANT
GAUZE XEROFORM 1X8 LF (GAUZE/BANDAGES/DRESSINGS) IMPLANT
GAUZE XEROFORM 5X9 LF (GAUZE/BANDAGES/DRESSINGS) IMPLANT
GLOVE SURG ENC MOIS LTX SZ6.5 (GLOVE) ×6 IMPLANT
GOWN STRL REUS W/ TWL LRG LVL3 (GOWN DISPOSABLE) ×2 IMPLANT
GOWN STRL REUS W/TWL LRG LVL3 (GOWN DISPOSABLE) ×4
GRAFT MYRIAD 3 LAYER 7X10 (Graft) ×1 IMPLANT
IV NS IRRIG 3000ML ARTHROMATIC (IV SOLUTION) IMPLANT
MANIFOLD NEPTUNE II (INSTRUMENTS) IMPLANT
MATRIX WOUND 3-LAYER 7X10 (Tissue) ×1 IMPLANT
MICROMATRIX 1000MG (Tissue) ×4 IMPLANT
NDL HYPO 25X1 1.5 SAFETY (NEEDLE) IMPLANT
NDL HYPO 27GX1-1/4 (NEEDLE) IMPLANT
NEEDLE HYPO 25X1 1.5 SAFETY (NEEDLE) IMPLANT
NEEDLE HYPO 27GX1-1/4 (NEEDLE) IMPLANT
NS IRRIG 1000ML POUR BTL (IV SOLUTION) ×2 IMPLANT
PACK BASIN DAY SURGERY FS (CUSTOM PROCEDURE TRAY) ×2 IMPLANT
PADDING CAST ABS 3INX4YD NS (CAST SUPPLIES)
PADDING CAST ABS 4INX4YD NS (CAST SUPPLIES)
PADDING CAST ABS COTTON 3X4 (CAST SUPPLIES) IMPLANT
PADDING CAST ABS COTTON 4X4 ST (CAST SUPPLIES) IMPLANT
PENCIL SMOKE EVACUATOR (MISCELLANEOUS) ×1 IMPLANT
PIN SAFETY STERILE (MISCELLANEOUS) IMPLANT
POWDER MYRIAD MORCELLS 1000MG (Miscellaneous) ×1 IMPLANT
SHEET MEDIUM DRAPE 40X70 STRL (DRAPES) ×1 IMPLANT
SLEEVE SCD COMPRESS KNEE MED (STOCKING) ×1 IMPLANT
SOLUTION PARTIC MCRMTRX 1000MG (Tissue) IMPLANT
SPIKE FLUID TRANSFER (MISCELLANEOUS) IMPLANT
SPLINT PLASTER CAST XFAST 3X15 (CAST SUPPLIES) IMPLANT
SPLINT PLASTER XTRA FASTSET 3X (CAST SUPPLIES)
SPONGE T-LAP 18X18 ~~LOC~~+RFID (SPONGE) ×2 IMPLANT
STAPLER VISISTAT 35W (STAPLE) IMPLANT
STOCKINETTE 4X48 STRL (DRAPES) IMPLANT
STOCKINETTE 6  STRL (DRAPES) ×1
STOCKINETTE 6 STRL (DRAPES) ×1 IMPLANT
STOCKINETTE IMPERVIOUS LG (DRAPES) IMPLANT
STRIP CLOSURE SKIN 1/2X4 (GAUZE/BANDAGES/DRESSINGS) IMPLANT
SUCTION FRAZIER HANDLE 10FR (MISCELLANEOUS)
SUCTION TUBE FRAZIER 10FR DISP (MISCELLANEOUS) IMPLANT
SURGILUBE 2OZ TUBE FLIPTOP (MISCELLANEOUS) ×3 IMPLANT
SUT MNCRL AB 4-0 PS2 18 (SUTURE) ×2 IMPLANT
SUT MON AB 3-0 SH 27 (SUTURE)
SUT MON AB 3-0 SH27 (SUTURE) IMPLANT
SUT SILK 3 0 PS 1 (SUTURE) IMPLANT
SUT VIC AB 3-0 FS2 27 (SUTURE) IMPLANT
SUT VIC AB 5-0 P-3 18X BRD (SUTURE) IMPLANT
SUT VIC AB 5-0 P3 18 (SUTURE) ×2
SUT VIC AB 5-0 PS2 18 (SUTURE) IMPLANT
SUT VICRYL 4-0 PS2 18IN ABS (SUTURE) IMPLANT
SWAB COLLECTION DEVICE MRSA (MISCELLANEOUS) IMPLANT
SWAB CULTURE ESWAB REG 1ML (MISCELLANEOUS) IMPLANT
SYR BULB IRRIG 60ML STRL (SYRINGE) IMPLANT
SYR CONTROL 10ML LL (SYRINGE) ×1 IMPLANT
TAPE HYPAFIX 6X30 (GAUZE/BANDAGES/DRESSINGS) IMPLANT
TOWEL GREEN STERILE FF (TOWEL DISPOSABLE) ×3 IMPLANT
TRAY DSU PREP LF (CUSTOM PROCEDURE TRAY) IMPLANT
TUBE CONNECTING 20X1/4 (TUBING) ×1 IMPLANT
UNDERPAD 30X36 HEAVY ABSORB (UNDERPADS AND DIAPERS) ×2 IMPLANT
YANKAUER SUCT BULB TIP NO VENT (SUCTIONS) IMPLANT

## 2021-09-05 NOTE — Transfer of Care (Signed)
Immediate Anesthesia Transfer of Care Note  Patient: Wayne Hunter  Procedure(s) Performed: IRRIGATION AND DEBRIDEMENT BILATERAL THIGH WOUNDS (Bilateral: Thigh) WOUND VAC CHANGE (Bilateral: Thigh) APPLICATION OF SKIN SUBSTITUTE- MYRIAD or ACELL (Bilateral: Thigh)  Patient Location: PACU  Anesthesia Type:General  Level of Consciousness: sedated  Airway & Oxygen Therapy: Patient Spontanous Breathing and Patient connected to face mask oxygen  Post-op Assessment: Report given to RN and Post -op Vital signs reviewed and stable  Post vital signs: Reviewed and stable  Last Vitals:  Vitals Value Taken Time  BP    Temp    Pulse 154 09/05/21 0909  Resp 16 09/05/21 0909  SpO2 100 % 09/05/21 0909  Vitals shown include unvalidated device data.  Last Pain:  Vitals:   09/05/21 0656  TempSrc: Oral  PainSc: 0-No pain      Patients Stated Pain Goal: 3 (80/99/83 3825)  Complications: No notable events documented.

## 2021-09-05 NOTE — H&P (View-Only) (Signed)
Wayne Hunter is an 26 y.o. male.   Chief Complaint: bilateral thigh wound HPI: The patient is a 26 yrs old male here for further care of his thighs.  He was at work when he sustained a crush injury.  He has been undergoing debridement with improvement.   Past Medical History:  Diagnosis Date   Crush injury, thigh, right, initial encounter 06/04/2021   DVT (deep venous thrombosis) ([facility])    left leg   Rhabdomyolysis 06/04/2021    Past Surgical History:  Procedure Laterality Date   APPLICATION OF A-CELL OF EXTREMITY Bilateral 08/05/2021   Procedure: APPLICATION OF A-CELL AND MYRIAD MATRIX TO BILATERAL THIGH WOUNDS;  Surgeon: Wallace Going, DO;  Location: [place];  Service: Plastics;  Laterality: Bilateral;   APPLICATION OF WOUND VAC Bilateral 07/18/2021   Procedure: APPLICATION OF WOUND VAC;  Surgeon: Wallace Going, DO;  Location: [place];  Service: Plastics;  Laterality: Bilateral;   APPLICATION OF WOUND VAC Bilateral 07/22/2021   Procedure: WOUND VAC CHANGE;  Surgeon: Wallace Going, DO;  Location: [place];  Service: Plastics;  Laterality: Bilateral;   APPLICATION OF WOUND VAC Bilateral 07/29/2021   Procedure: WOUND VAC CHANGE;  Surgeon: Wallace Going, DO;  Location: [place];  Service: Plastics;  Laterality: Bilateral;   APPLICATION OF WOUND VAC Bilateral 08/05/2021   Procedure: WOUND VAC CHANGE;  Surgeon: Wallace Going, DO;  Location: [place];  Service: Plastics;  Laterality: Bilateral;   APPLICATION OF WOUND VAC Bilateral 08/12/2021   Procedure: BILATERAL WOUND VAC CHANGE;  Surgeon: Wallace Going, DO;  Location: [place];  Service: Plastics;  Laterality: Bilateral;   APPLICATION OF WOUND VAC Bilateral 08/19/2021   Procedure: WOUND VAC CHANGE;  Surgeon: Wallace Going, DO;  Location: [place];  Service: Plastics;  Laterality: Bilateral;   APPLICATION OF WOUND VAC Bilateral 08/29/2021   Procedure: WOUND VAC Change;  Surgeon: Wallace Going, DO;  Location:  [place];  Service: Plastics;  Laterality: Bilateral;   I & D EXTREMITY Bilateral 06/02/2021   Procedure: IRRIGATION AND DEBRIDEMENT RIGHT AND LEFT THIGH;  Surgeon: Altamese Delanson, MD;  Location: [place];  Service: Orthopedics;  Laterality: Bilateral;  Irrigation and debridement B thighs   INCISION AND DRAINAGE OF WOUND Bilateral 07/16/2021   Procedure: IRRIGATION AND DEBRIDEMENT WOUND THIGHS;  Surgeon: Altamese [place], MD;  Location: [place];  Service: Orthopedics;  Laterality: Bilateral;   INCISION AND DRAINAGE OF WOUND Bilateral 07/18/2021   Procedure: IRRIGATION AND DEBRIDEMENT WOUND;  Surgeon: Wallace Going, DO;  Location: [place];  Service: Plastics;  Laterality: Bilateral;   INCISION AND DRAINAGE OF WOUND Bilateral 07/22/2021   Procedure: IRRIGATION AND DEBRIDEMENT WOUND;  Surgeon: Wallace Going, DO;  Location: [place];  Service: Plastics;  Laterality: Bilateral;   INCISION AND DRAINAGE OF WOUND Bilateral 07/29/2021   Procedure: IRRIGATION AND DEBRIDEMENT WOUND;  Surgeon: Wallace Going, DO;  Location: [place];  Service: Plastics;  Laterality: Bilateral;   INCISION AND DRAINAGE OF WOUND Bilateral 08/05/2021   Procedure: IRRIGATION AND DEBRIDEMENT BILATERAL THIGH WOUND;  Surgeon: Wallace Going, DO;  Location: [place];  Service: Plastics;  Laterality: Bilateral;  45 MIN   INCISION AND DRAINAGE OF WOUND Bilateral 08/12/2021   Procedure: IRRIGATION AND DEBRIDEMENT BILATERAL THIGH WOUNDS;  Surgeon: Wallace Going, DO;  Location: [place];  Service: Plastics;  Laterality: Bilateral;   INCISION AND DRAINAGE OF WOUND Bilateral 08/19/2021   Procedure: IRRIGATION AND DEBRIDEMENT BILATERAL  THIGH  WOUND;  Surgeon: Wallace Going, DO;  Location: [place];  Service: Plastics;  Laterality: Bilateral;  1 hour   INCISION AND DRAINAGE OF WOUND Bilateral 08/29/2021   Procedure: IRRIGATION AND DEBRIDEMENT BILATERAL THIGH WOUND;  Surgeon: Wallace Going, DO;  Location: [place];  Service: Plastics;  Laterality: Bilateral;  1 hour    History reviewed. No pertinent family history. Social History:  reports that he has never smoked. He has never used smokeless tobacco. He reports that he does not currently use alcohol. He reports that he does not currently use drugs.  Allergies:  Allergies  Allergen Reactions   Bee Venom Swelling    Medications Prior to Admission  Medication Sig Dispense Refill   acetaminophen (TYLENOL) 325 MG tablet Take 1-2 tablets (325-650 mg total) by mouth every 6 (six) hours as needed for mild pain (pain score 1-3 or temp > 100.5).     apixaban (ELIQUIS) 5 MG TABS tablet Take 1 tablet (5 mg total) by mouth every 12 (twelve) hours. 60 tablet 1   cyclobenzaprine (FLEXERIL) 5 MG tablet Take 1-2 tablets (5-10 mg total) by mouth 3 (three) times daily as needed for muscle spasms. 30 tablet 0   ibuprofen (ADVIL) 200 MG tablet Take 600 mg by mouth every 8 (eight) hours as needed (BACK PAIN.).     oxyCODONE-acetaminophen (PERCOCET/ROXICET) 5-325 MG tablet Take 1-2 tablets by mouth every 12 (twelve) hours as needed for severe pain (Patient taking differently: Take 1-2 tablets by mouth every 12 (twelve) hours as needed for moderate pain or severe pain.) 40 tablet 0   PROTEIN PO Take by mouth daily. Protein shake     propranolol (INDERAL) 10 MG tablet Take 1 tablet (10 mg total) by mouth 2 (two) times daily. 30 tablet 0    No results found for this or any previous visit (from the past 48 hour(s)). No results found.  Review of Systems  Constitutional:  Positive for activity change.  HENT: Negative.    Eyes: Negative.   Respiratory: Negative.    Cardiovascular:  Positive for leg swelling.  Gastrointestinal: Negative.   Endocrine: Negative.   Genitourinary: Negative.   Musculoskeletal:  Positive for gait problem.  Skin:  Positive for color change and wound.  Psychiatric/Behavioral: Negative.     Blood pressure (!) 132/96, pulse 86,  temperature 97.8 F (36.6 C), temperature source Oral, resp. rate 16, height _0  (1.676 m), weight 89.5 kg, SpO2 100 %. Physical Exam Constitutional:      Appearance: Normal appearance.  Cardiovascular:     Rate and Rhythm: Normal rate.     Pulses: Normal pulses.  Pulmonary:     Effort: Pulmonary effort is normal.  Abdominal:     Palpations: Abdomen is soft.  Skin:    Findings: Bruising and lesion present.  Neurological:     Mental Status: He is alert and oriented to person, place, and time.  Psychiatric:        Mood and Affect: Mood normal.        Behavior: Behavior normal.     Assessment/Plan Bilateral thigh wounds.  Plan for vac change and placement of myriad and acell.  [place], DO 09/05/2021, 7:01 AM

## 2021-09-05 NOTE — Op Note (Signed)
DATE OF OPERATION: 09/05/2021  LOCATION: [facility]  PREOPERATIVE DIAGNOSIS: Bilateral thigh wounds  POSTOPERATIVE DIAGNOSIS: Same  PROCEDURE:  Preparation of right thigh wound 7 x 10 cm for placement of Acell powder 2 gm and sheet 7 x 10 cm Prparation of left thigh wound 5 x 10 cm for placement of Myriad powder 1 gm and sheet 7 x 10 cm Placement of VAC to bilateral thigh wounds  SURGEON: Hally Colella Sanger Trever Streater, DO  ASSISTANT: Roetta Sessions, PA  EBL: 1 cc  CONDITION: Stable  COMPLICATIONS: None  INDICATION: The patient, Wayne Hunter, is a 26 y.o. male born on [birth year], is here for treatment of complicated traumatic thigh wounds.   PROCEDURE DETAILS:  The patient was seen prior to surgery and marked.  The IV antibiotics were given. The patient was taken to the operating room and given a general anesthetic. A standard time out was performed and all information was confirmed by those in the room. The thighs were prepped and draped  Right thigh:   The 7 x 10 cm wound was irrigated with saline.  All of the acell powder and sheet was applied. The sheet was secured with the 5-0 Vicryl.  The sorbact was applied and secured with the 5-0 Vicryl.  The VAC and KY gel was placed and there was an excellent seal.  Left thigh:  the 5 x 10 wound was irrigated with saline.  All of the myriad powder and sheet was appllied and secured with the 5-0 Vicryl. The sorbact was placed and then the VAC sponge and KY gel.   There was an excellent seal. The patient was allowed to wake up and taken to recovery room in stable condition at the end of the case. The family was notified at the end of the case.   The advanced practice practitioner (APP) assisted throughout the case.  The APP was essential in retraction and counter traction when needed to make the case progress smoothly.  This retraction and assistance made it possible to see the tissue plans for the procedure.  The assistance  was needed for blood control, tissue re-approximation and assisted with closure of the incision site.

## 2021-09-05 NOTE — Addendum Note (Signed)
Addendum  created 09/05/21 1142 by Willa Frater, CRNA   Problem List reviewed, Review and Sign - Signed

## 2021-09-05 NOTE — Telephone Encounter (Signed)
Called [employer] yesterday evening, canister is in route to be delivered at patients house. Spoke with wife and they received call from [employer], who confirmed canister is supposed to be delivered that evening.

## 2021-09-05 NOTE — Discharge Instructions (Addendum)
Keep VAC at 125 mmHg pressure continuous   Post Anesthesia Home Care Instructions  Activity: Get plenty of rest for the remainder of the day. A responsible individual must stay with you for 24 hours following the procedure.  For the next 24 hours, DO NOT: -Drive a car -Advertising copywriter -Drink alcoholic beverages -Take any medication unless instructed by your physician -Make any legal decisions or sign important papers.  Meals: Start with liquid foods such as gelatin or soup. Progress to regular foods as tolerated. Avoid greasy, spicy, heavy foods. If nausea and/or vomiting occur, drink only clear liquids until the nausea and/or vomiting subsides. Call your physician if vomiting continues.  Special Instructions/Symptoms: Your throat may feel dry or sore from the anesthesia or the breathing tube placed in your throat during surgery. If this causes discomfort, gargle with warm salt water. The discomfort should disappear within 24 hours.  If you had a scopolamine patch placed behind your ear for the management of post- operative nausea and/or vomiting:  1. The medication in the patch is effective for 72 hours, after which it should be removed.  Wrap patch in a tissue and discard in the trash. Wash hands thoroughly with soap and water. 2. You may remove the patch earlier than 72 hours if you experience unpleasant side effects which may include dry mouth, dizziness or visual disturbances. 3. Avoid touching the patch. Wash your hands with soap and water after contact with the patch.

## 2021-09-05 NOTE — H&P (Signed)
Wayne Hunter is an 26 y.o. male.   °Chief Complaint: bilateral thigh wound °HPI: The patient is a 26 yrs old male here for further care of his thighs.  He was at work when he sustained a crush injury.  He has been undergoing debridement with improvement.  ° °Past Medical History:  °Diagnosis Date  ° Crush injury, thigh, right, initial encounter 06/04/2021  ° DVT (deep venous thrombosis) ([facility])   ° left leg  ° Rhabdomyolysis 06/04/2021  ° ° °Past Surgical History:  °Procedure Laterality Date  ° APPLICATION OF A-CELL OF EXTREMITY Bilateral 08/05/2021  ° Procedure: APPLICATION OF A-CELL AND MYRIAD MATRIX TO BILATERAL THIGH WOUNDS;  Surgeon: Reggie Bise S, DO;  [facility];  Service: Plastics;  Laterality: Bilateral;  ° APPLICATION OF WOUND VAC Bilateral 07/18/2021  ° Procedure: APPLICATION OF WOUND VAC;  Surgeon: Kyndle Schlender S, DO;  [facility];  Service: Plastics;  Laterality: Bilateral;  ° APPLICATION OF WOUND VAC Bilateral 07/22/2021  ° Procedure: WOUND VAC CHANGE;  Surgeon: Paola Flynt S, DO;  [facility];  Service: Plastics;  Laterality: Bilateral;  ° APPLICATION OF WOUND VAC Bilateral 07/29/2021  ° Procedure: WOUND VAC CHANGE;  Surgeon: Sevon Rotert S, DO;  [facility];  Service: Plastics;  Laterality: Bilateral;  ° APPLICATION OF WOUND VAC Bilateral 08/05/2021  ° Procedure: WOUND VAC CHANGE;  Surgeon: Dalores Weger S, DO;  [facility];  Service: Plastics;  Laterality: Bilateral;  ° APPLICATION OF WOUND VAC Bilateral 08/12/2021  ° Procedure: BILATERAL WOUND VAC CHANGE;  Surgeon: Raneen Jaffer S, DO;  [facility];  Service: Plastics;  Laterality: Bilateral;  ° APPLICATION OF WOUND VAC Bilateral 08/19/2021  ° Procedure: WOUND VAC CHANGE;  Surgeon: Margreat Widener S, DO;  [facility];  Service: Plastics;  Laterality: Bilateral;  ° APPLICATION OF WOUND VAC Bilateral 08/29/2021  ° Procedure: WOUND VAC Change;  Surgeon: Malajah Oceguera S, DO;  Location:  [facility];  Service: Plastics;  Laterality: Bilateral;  ° I & D EXTREMITY Bilateral 06/02/2021  ° Procedure: IRRIGATION AND DEBRIDEMENT RIGHT AND LEFT THIGH;  Surgeon: Handy, Michael, MD;  [facility];  Service: Orthopedics;  Laterality: Bilateral;  Irrigation and debridement B thighs  ° INCISION AND DRAINAGE OF WOUND Bilateral 07/16/2021  ° Procedure: IRRIGATION AND DEBRIDEMENT WOUND THIGHS;  Surgeon: Handy, Michael, MD;  [facility];  Service: Orthopedics;  Laterality: Bilateral;  ° INCISION AND DRAINAGE OF WOUND Bilateral 07/18/2021  ° Procedure: IRRIGATION AND DEBRIDEMENT WOUND;  Surgeon: Argil Mahl S, DO;  [facility];  Service: Plastics;  Laterality: Bilateral;  ° INCISION AND DRAINAGE OF WOUND Bilateral 07/22/2021  ° Procedure: IRRIGATION AND DEBRIDEMENT WOUND;  Surgeon: Jacquelin Krajewski S, DO;  [facility];  Service: Plastics;  Laterality: Bilateral;  ° INCISION AND DRAINAGE OF WOUND Bilateral 07/29/2021  ° Procedure: IRRIGATION AND DEBRIDEMENT WOUND;  Surgeon: Shemia Bevel S, DO;  [facility];  Service: Plastics;  Laterality: Bilateral;  ° INCISION AND DRAINAGE OF WOUND Bilateral 08/05/2021  ° Procedure: IRRIGATION AND DEBRIDEMENT BILATERAL THIGH WOUND;  Surgeon: Kwynn Schlotter S, DO;  [facility];  Service: Plastics;  Laterality: Bilateral;  45 MIN  ° INCISION AND DRAINAGE OF WOUND Bilateral 08/12/2021  ° Procedure: IRRIGATION AND DEBRIDEMENT BILATERAL THIGH WOUNDS;  Surgeon: Yoshiaki Kreuser S, DO;  [facility];  Service: Plastics;  Laterality: Bilateral;  ° INCISION AND DRAINAGE OF WOUND Bilateral 08/19/2021  ° Procedure: IRRIGATION AND DEBRIDEMENT BILATERAL   THIGH  WOUND;  Surgeon: Adilyn Humes S, DO;  [facility];  Service: Plastics;  Laterality: Bilateral;  1 hour  ° INCISION AND DRAINAGE OF WOUND Bilateral 08/29/2021  ° Procedure: IRRIGATION AND DEBRIDEMENT BILATERAL THIGH WOUND;  Surgeon: Deitra Craine S, DO;  Location: [facility];  Service: Plastics;  Laterality: Bilateral;  1 hour  ° ° °History reviewed. No pertinent family history. °Social History:  reports that he has never smoked. He has never used smokeless tobacco. He reports that he does not currently use alcohol. He reports that he does not currently use drugs. ° °Allergies:  °Allergies  °Allergen Reactions  ° Bee Venom Swelling  ° ° °Medications Prior to Admission  °Medication Sig Dispense Refill  ° acetaminophen (TYLENOL) 325 MG tablet Take 1-2 tablets (325-650 mg total) by mouth every 6 (six) hours as needed for mild pain (pain score 1-3 or temp > 100.5).    ° apixaban (ELIQUIS) 5 MG TABS tablet Take 1 tablet (5 mg total) by mouth every 12 (twelve) hours. 60 tablet 1  ° cyclobenzaprine (FLEXERIL) 5 MG tablet Take 1-2 tablets (5-10 mg total) by mouth 3 (three) times daily as needed for muscle spasms. 30 tablet 0  ° ibuprofen (ADVIL) 200 MG tablet Take 600 mg by mouth every 8 (eight) hours as needed (BACK PAIN.).    ° oxyCODONE-acetaminophen (PERCOCET/ROXICET) 5-325 MG tablet Take 1-2 tablets by mouth every 12 (twelve) hours as needed for severe pain (Patient taking differently: Take 1-2 tablets by mouth every 12 (twelve) hours as needed for moderate pain or severe pain.) 40 tablet 0  ° PROTEIN PO Take by mouth daily. Protein shake    ° propranolol (INDERAL) 10 MG tablet Take 1 tablet (10 mg total) by mouth 2 (two) times daily. 30 tablet 0  ° ° °No results found for this or any previous visit (from the past 48 hour(s)). °No results found. ° °Review of Systems  °Constitutional:  Positive for activity change.  °HENT: Negative.    °Eyes: Negative.   °Respiratory: Negative.    °Cardiovascular:  Positive for leg swelling.  °Gastrointestinal: Negative.   °Endocrine: Negative.   °Genitourinary: Negative.   °Musculoskeletal:  Positive for gait problem.  °Skin:  Positive for color change and wound.  °Psychiatric/Behavioral: Negative.    ° °Blood pressure (!) 132/96, pulse 86,  temperature 97.8 °F (36.6 °C), temperature source Oral, resp. rate 16, height 5' 6" (1.676 m), weight 89.5 kg, SpO2 100 %. °Physical Exam °Constitutional:   °   Appearance: Normal appearance.  °Cardiovascular:  °   Rate and Rhythm: Normal rate.  °   Pulses: Normal pulses.  °Pulmonary:  °   Effort: Pulmonary effort is normal.  °Abdominal:  °   Palpations: Abdomen is soft.  °Skin: °   Findings: Bruising and lesion present.  °Neurological:  °   Mental Status: He is alert and oriented to person, place, and time.  °Psychiatric:     °   Mood and Affect: Mood normal.     °   Behavior: Behavior normal.  °  ° °Assessment/Plan °Bilateral thigh wounds.  Plan for vac change and placement of myriad and acell. ° °Mindy Gali S Shelby Anderle, DO °09/05/2021, 7:01 AM ° ° ° °

## 2021-09-05 NOTE — Anesthesia Postprocedure Evaluation (Signed)
Anesthesia Post Note  Patient: Wayne Hunter  Procedure(s) Performed: IRRIGATION AND DEBRIDEMENT BILATERAL THIGH WOUNDS (Bilateral: Thigh) WOUND VAC CHANGE (Bilateral: Thigh) APPLICATION OF SKIN SUBSTITUTE- MYRIAD or ACELL (Bilateral: Thigh)     Patient location during evaluation: PACU Anesthesia Type: General Level of consciousness: awake and alert Pain management: pain level controlled Vital Signs Assessment: post-procedure vital signs reviewed and stable Respiratory status: spontaneous breathing, nonlabored ventilation and respiratory function stable Cardiovascular status: blood pressure returned to baseline and stable Postop Assessment: no apparent nausea or vomiting Anesthetic complications: no   No notable events documented.  Last Vitals:  Vitals:   09/05/21 0945 09/05/21 1018  BP: 117/82 125/75  Pulse: 89 90  Resp: 19 18  Temp:  36.6 C  SpO2: 100% 98%    Last Pain:  Vitals:   09/05/21 1006  TempSrc:   PainSc: 0-No pain                 Lynda Rainwater

## 2021-09-05 NOTE — Anesthesia Preprocedure Evaluation (Signed)
Anesthesia Evaluation  Patient identified by MRN, date of birth, ID band Patient awake    Reviewed: Allergy & Precautions, NPO status , Patient's Chart, lab work & pertinent test results  Airway Mallampati: I  TM Distance: >3 FB Neck ROM: Full    Dental no notable dental hx. (+) Teeth Intact, Dental Advisory Given   Pulmonary neg pulmonary ROS,    Pulmonary exam normal breath sounds clear to auscultation       Cardiovascular + DVT (eliquis)  Normal cardiovascular exam Rhythm:Regular Rate:Tachycardia     Neuro/Psych  Neuromuscular disease negative psych ROS   GI/Hepatic negative GI ROS, Neg liver ROS,   Endo/Other  negative endocrine ROS  Renal/GU negative Renal ROS  negative genitourinary   Musculoskeletal Bilateral open thigh wounds due to crush injury, S/P multiple I&D's Rhabdomyolysisi   Abdominal (+) + obese,   Peds  Hematology  (+) Blood dyscrasia, anemia ,   Anesthesia Other Findings Run over by a lawn equipment tractor at the end of November- b/l LE crush injuries  Reproductive/Obstetrics negative OB ROS                             Anesthesia Physical  Anesthesia Plan  ASA: 2  Anesthesia Plan: General   Post-op Pain Management:    Induction: Intravenous  PONV Risk Score and Plan: 2 and Treatment may vary due to age or medical condition, Ondansetron and Midazolam  Airway Management Planned: LMA  Additional Equipment: None  Intra-op Plan:   Post-operative Plan: Extubation in OR  Informed Consent: I have reviewed the patients History and Physical, chart, labs and discussed the procedure including the risks, benefits and alternatives for the proposed anesthesia with the patient or authorized representative who has indicated his/her understanding and acceptance.     Dental advisory given  Plan Discussed with: CRNA, Anesthesiologist and Surgeon  Anesthesia Plan  Comments:         Anesthesia Quick Evaluation

## 2021-09-05 NOTE — Anesthesia Procedure Notes (Signed)
Procedure Name: LMA Insertion Date/Time: 09/05/2021 8:32 AM Performed by: Ronnette Hila, CRNA Pre-anesthesia Checklist: Patient identified, Emergency Drugs available, Suction available and Patient being monitored Patient Re-evaluated:Patient Re-evaluated prior to induction Oxygen Delivery Method: Circle system utilized Preoxygenation: Pre-oxygenation with 100% oxygen Induction Type: IV induction Ventilation: Mask ventilation without difficulty LMA: LMA inserted LMA Size: 5.0 Number of attempts: 1 Airway Equipment and Method: Bite block Placement Confirmation: positive ETCO2 Tube secured with: Tape Dental Injury: Teeth and Oropharynx as per pre-operative assessment

## 2021-09-06 ENCOUNTER — Encounter (HOSPITAL_BASED_OUTPATIENT_CLINIC_OR_DEPARTMENT_OTHER): Payer: Self-pay | Admitting: Plastic Surgery

## 2021-09-10 ENCOUNTER — Telehealth: Payer: Self-pay | Admitting: *Deleted

## 2021-09-10 NOTE — Telephone Encounter (Signed)
Received on (08/31/21) Letter of Medical Necessity for Excessive Supplies via of fax from [facility].  Requesting to be filled out and returned.  Given to provider to complete and sign.  Letter of Medical Necessity completed,signed, and faxed back to [facility].   Confirmation received and copy scanned into the chart.//AB/CMA

## 2021-09-11 ENCOUNTER — Other Ambulatory Visit: Payer: Self-pay

## 2021-09-11 ENCOUNTER — Encounter (HOSPITAL_COMMUNITY): Payer: Self-pay | Admitting: Plastic Surgery

## 2021-09-11 NOTE — Progress Notes (Signed)
DUE TO COVID-19 ONLY ONE VISITOR IS ALLOWED TO COME WITH YOU AND STAY IN THE WAITING ROOM ONLY DURING PRE OP AND PROCEDURE DAY OF SURGERY.  ? ?PCP - None ?Cardiologist - n/a ? ?Chest x-ray - 05/31/21 (1V) ?EKG - 07/25/21 ?Stress Test - n/a ?ECHO - n/a ?Cardiac Cath - n/a ? ?ICD Pacemaker/Loop - n/a ? ?Sleep Study -  n/a ?CPAP - none ? ?Blood Thinner Instructions:  Last dose of Eliquis was on 09/10/21.  Spoke with Dr Jairo Ben and [facility] for Dr Ulice Bold informing them that patient's last dose of Eliquis  was on Tuesday, 09/10/21.  Also called Dr Kittie Plater PA [facility] but no answer, [facility] for [place].  ? ?STOP now taking any Aspirin (unless otherwise instructed by your surgeon), Aleve, Naproxen, Ibuprofen, Motrin, Advil, Goody's, BC's, all herbal medications, fish oil, and all vitamins.  ? ?Coronavirus Screening ?Covid test n/a Ambulatory Surgery  ?Do you have any of the following symptoms:  ?Cough yes/no: No ?Fever (>100.69F)  yes/no: No ?Runny nose yes/no: No ?Sore throat yes/no: No ?Difficulty breathing/shortness of breath  yes/no: No ? ?Have you traveled in the last 14 days and where? yes/no: No ? ?Patient verbalized understanding of instructions that were given via phone. ?

## 2021-09-12 ENCOUNTER — Encounter (HOSPITAL_COMMUNITY)
Admission: RE | Disposition: A | Discharge status: Home / Self Care | Payer: Self-pay | Source: Home / Self Care | Attending: Plastic Surgery

## 2021-09-12 ENCOUNTER — Ambulatory Visit (HOSPITAL_COMMUNITY)
Admission: RE | Admit: 2021-09-12 | Discharge: 2021-09-12 | Disposition: A | Discharge status: Home / Self Care | Payer: PRIVATE HEALTH INSURANCE | Attending: Plastic Surgery | Admitting: Plastic Surgery

## 2021-09-12 ENCOUNTER — Ambulatory Visit (HOSPITAL_BASED_OUTPATIENT_CLINIC_OR_DEPARTMENT_OTHER): Payer: PRIVATE HEALTH INSURANCE | Admitting: Certified Registered Nurse Anesthetist

## 2021-09-12 ENCOUNTER — Ambulatory Visit (HOSPITAL_COMMUNITY): Payer: PRIVATE HEALTH INSURANCE | Admitting: Certified Registered Nurse Anesthetist

## 2021-09-12 ENCOUNTER — Encounter (HOSPITAL_COMMUNITY): Payer: Self-pay | Admitting: Plastic Surgery

## 2021-09-12 ENCOUNTER — Other Ambulatory Visit: Payer: Self-pay

## 2021-09-12 DIAGNOSIS — S71101D Unspecified open wound, right thigh, subsequent encounter: Secondary | ICD-10-CM | POA: Diagnosis not present

## 2021-09-12 DIAGNOSIS — S71102D Unspecified open wound, left thigh, subsequent encounter: Secondary | ICD-10-CM | POA: Diagnosis not present

## 2021-09-12 DIAGNOSIS — S71102A Unspecified open wound, left thigh, initial encounter: Secondary | ICD-10-CM | POA: Diagnosis not present

## 2021-09-12 DIAGNOSIS — S71101A Unspecified open wound, right thigh, initial encounter: Secondary | ICD-10-CM | POA: Insufficient documentation

## 2021-09-12 DIAGNOSIS — X58XXXA Exposure to other specified factors, initial encounter: Secondary | ICD-10-CM | POA: Diagnosis not present

## 2021-09-12 HISTORY — PX: APPLICATION OF WOUND VAC: SHX5189

## 2021-09-12 HISTORY — PX: INCISION AND DRAINAGE OF WOUND: SHX1803

## 2021-09-12 LAB — POCT I-STAT, CHEM 8
BUN: 12 mg/dL (ref 6–20)
Calcium, Ion: 1.21 mmol/L (ref 1.15–1.40)
Chloride: 101 mmol/L (ref 98–111)
Creatinine, Ser: 0.7 mg/dL (ref 0.61–1.24)
Glucose, Bld: 93 mg/dL (ref 70–99)
HCT: 40 % (ref 39.0–52.0)
Hemoglobin: 13.6 g/dL (ref 13.0–17.0)
Potassium: 4.1 mmol/L (ref 3.5–5.1)
Sodium: 139 mmol/L (ref 135–145)
TCO2: 30 mmol/L (ref 22–32)

## 2021-09-12 SURGERY — IRRIGATION AND DEBRIDEMENT WOUND
Anesthesia: General | Site: Thigh | Laterality: Bilateral

## 2021-09-12 MED ORDER — ONDANSETRON HCL 4 MG/2ML IJ SOLN
INTRAMUSCULAR | Status: DC | PRN
  Administered 2021-09-12: 4 mg via INTRAVENOUS

## 2021-09-12 MED ORDER — SODIUM CHLORIDE 0.9% FLUSH: 3.0000 mL | Freq: Two times a day (BID) | INTRAVENOUS | Status: DC

## 2021-09-12 MED ORDER — OXYCODONE HCL 5 MG PO TABS: 5.0000 mg | ORAL_TABLET | Freq: Once | ORAL | Status: DC | PRN

## 2021-09-12 MED ORDER — MORPHINE SULFATE (PF) 2 MG/ML IV SOLN: 2.0000 mg | INTRAVENOUS | Status: DC | PRN

## 2021-09-12 MED ORDER — MIDAZOLAM HCL 2 MG/2ML IJ SOLN
INTRAMUSCULAR | Status: AC
  Filled 2021-09-12: qty 2

## 2021-09-12 MED ORDER — FENTANYL CITRATE (PF) 250 MCG/5ML IJ SOLN
INTRAMUSCULAR | Status: DC | PRN
  Administered 2021-09-12 (×2): 50 ug via INTRAVENOUS

## 2021-09-12 MED ORDER — ONDANSETRON HCL 4 MG/2ML IJ SOLN: 4.0000 mg | Freq: Once | INTRAMUSCULAR | Status: DC | PRN

## 2021-09-12 MED ORDER — ACETAMINOPHEN 650 MG RE SUPP: 650.0000 mg | RECTAL | Status: DC | PRN

## 2021-09-12 MED ORDER — PROPOFOL 10 MG/ML IV BOLUS
INTRAVENOUS | Status: AC
  Filled 2021-09-12: qty 20

## 2021-09-12 MED ORDER — PROPOFOL 10 MG/ML IV BOLUS
INTRAVENOUS | Status: DC | PRN
  Administered 2021-09-12: 200 mg via INTRAVENOUS

## 2021-09-12 MED ORDER — LACTATED RINGERS IV SOLN: INTRAVENOUS | Status: DC

## 2021-09-12 MED ORDER — LIDOCAINE 2% (20 MG/ML) 5 ML SYRINGE
INTRAMUSCULAR | Status: DC | PRN
  Administered 2021-09-12: 50 mg via INTRAVENOUS

## 2021-09-12 MED ORDER — KETOROLAC TROMETHAMINE 30 MG/ML IJ SOLN: 30.0000 mg | Freq: Once | INTRAMUSCULAR | Status: DC | PRN

## 2021-09-12 MED ORDER — ACETAMINOPHEN 325 MG PO TABS: 650.0000 mg | ORAL_TABLET | ORAL | Status: DC | PRN

## 2021-09-12 MED ORDER — DEXAMETHASONE SODIUM PHOSPHATE 10 MG/ML IJ SOLN
INTRAMUSCULAR | Status: AC
  Filled 2021-09-12: qty 1

## 2021-09-12 MED ORDER — 0.9 % SODIUM CHLORIDE (POUR BTL) OPTIME
TOPICAL | Status: DC | PRN
  Administered 2021-09-12: 1000 mL

## 2021-09-12 MED ORDER — FENTANYL CITRATE (PF) 250 MCG/5ML IJ SOLN
INTRAMUSCULAR | Status: AC
  Filled 2021-09-12: qty 5

## 2021-09-12 MED ORDER — SODIUM CHLORIDE 0.9 % IV SOLN: 250.0000 mL | INTRAVENOUS | Status: DC | PRN

## 2021-09-12 MED ORDER — ORAL CARE MOUTH RINSE: 15.0000 mL | Freq: Once | OROMUCOSAL | Status: AC

## 2021-09-12 MED ORDER — DEXAMETHASONE SODIUM PHOSPHATE 10 MG/ML IJ SOLN
INTRAMUSCULAR | Status: DC | PRN
Start: 2021-09-12 — End: 2021-09-12
  Administered 2021-09-12: 10 mg via INTRAVENOUS

## 2021-09-12 MED ORDER — LIDOCAINE 2% (20 MG/ML) 5 ML SYRINGE
INTRAMUSCULAR | Status: AC
  Filled 2021-09-12: qty 5

## 2021-09-12 MED ORDER — ONDANSETRON HCL 4 MG/2ML IJ SOLN
INTRAMUSCULAR | Status: AC
  Filled 2021-09-12: qty 2

## 2021-09-12 MED ORDER — CEFAZOLIN SODIUM-DEXTROSE 2-4 GM/100ML-% IV SOLN
2.0000 g | INTRAVENOUS | Status: AC
  Administered 2021-09-12: 2 g via INTRAVENOUS
  Filled 2021-09-12: qty 100

## 2021-09-12 MED ORDER — MIDAZOLAM HCL 2 MG/2ML IJ SOLN
INTRAMUSCULAR | Status: DC | PRN
  Administered 2021-09-12: 2 mg via INTRAVENOUS

## 2021-09-12 MED ORDER — OXYCODONE HCL 5 MG PO TABS: 5.0000 mg | ORAL_TABLET | ORAL | Status: DC | PRN

## 2021-09-12 MED ORDER — OXYCODONE HCL 5 MG/5ML PO SOLN: 5.0000 mg | Freq: Once | ORAL | Status: DC | PRN

## 2021-09-12 MED ORDER — CHLORHEXIDINE GLUCONATE 0.12 % MT SOLN
15.0000 mL | Freq: Once | OROMUCOSAL | Status: AC
  Administered 2021-09-12: 15 mL via OROMUCOSAL
  Filled 2021-09-12: qty 15

## 2021-09-12 MED ORDER — HYDROMORPHONE HCL 1 MG/ML IJ SOLN: 0.2500 mg | INTRAMUSCULAR | Status: DC | PRN

## 2021-09-12 MED ORDER — SODIUM CHLORIDE 0.9% FLUSH: 3.0000 mL | INTRAVENOUS | Status: DC | PRN

## 2021-09-12 MED ORDER — CHLORHEXIDINE GLUCONATE CLOTH 2 % EX PADS: 6.0000 | MEDICATED_PAD | Freq: Once | CUTANEOUS | Status: DC

## 2021-09-12 SURGICAL SUPPLY — 63 items
APPLICATOR COTTON TIP 6 STRL (MISCELLANEOUS) IMPLANT
APPLICATOR COTTON TIP 6IN STRL (MISCELLANEOUS) IMPLANT
BAG COUNTER SPONGE SURGICOUNT (BAG) ×2 IMPLANT
BAG DECANTER FOR FLEXI CONT (MISCELLANEOUS) IMPLANT
BENZOIN TINCTURE PRP APPL 2/3 (GAUZE/BANDAGES/DRESSINGS) ×1 IMPLANT
CANISTER SUCT 3000ML PPV (MISCELLANEOUS) ×2 IMPLANT
CNTNR URN SCR LID CUP LEK RST (MISCELLANEOUS) IMPLANT
CONT SPEC 4OZ STRL OR WHT (MISCELLANEOUS) ×1
COVER SURGICAL LIGHT HANDLE (MISCELLANEOUS) ×2 IMPLANT
DRAIN CHANNEL 19F RND (DRAIN) IMPLANT
DRAIN JP 10F RND SILICONE (MISCELLANEOUS) IMPLANT
DRAPE DERMATAC (DRAPES) ×2 IMPLANT
DRAPE HALF SHEET 40X57 (DRAPES) IMPLANT
DRAPE IMP U-DRAPE 54X76 (DRAPES) ×1 IMPLANT
DRAPE INCISE IOBAN 66X45 STRL (DRAPES) ×2 IMPLANT
DRAPE LAPAROSCOPIC ABDOMINAL (DRAPES) IMPLANT
DRAPE LAPAROTOMY 100X72 PEDS (DRAPES) ×1 IMPLANT
DRAPE ORTHO SPLIT 77X108 STRL (DRAPES) ×2
DRAPE SURG ORHT 6 SPLT 77X108 (DRAPES) IMPLANT
DRSG ADAPTIC 3X8 NADH LF (GAUZE/BANDAGES/DRESSINGS) IMPLANT
DRSG PAD ABDOMINAL 8X10 ST (GAUZE/BANDAGES/DRESSINGS) IMPLANT
DRSG VAC ATS LRG SENSATRAC (GAUZE/BANDAGES/DRESSINGS) ×1 IMPLANT
DRSG VAC ATS MED SENSATRAC (GAUZE/BANDAGES/DRESSINGS) IMPLANT
DRSG VAC ATS SM SENSATRAC (GAUZE/BANDAGES/DRESSINGS) ×1 IMPLANT
ELECT CAUTERY BLADE 6.4 (BLADE) ×1 IMPLANT
ELECT REM PT RETURN 9FT ADLT (ELECTROSURGICAL) ×2
ELECTRODE REM PT RTRN 9FT ADLT (ELECTROSURGICAL) ×1 IMPLANT
GAUZE SPONGE 4X4 12PLY STRL (GAUZE/BANDAGES/DRESSINGS) IMPLANT
GLOVE SURG ENC MOIS LTX SZ6.5 (GLOVE) ×2 IMPLANT
GLOVE SURG ENC TEXT LTX SZ6.5 (GLOVE) ×2 IMPLANT
GOWN STRL REUS W/ TWL LRG LVL3 (GOWN DISPOSABLE) ×3 IMPLANT
GOWN STRL REUS W/TWL LRG LVL3 (GOWN DISPOSABLE) ×3
GRAFT MYRIAD 3 LAYER 7X10 (Graft) ×1 IMPLANT
KIT BASIN OR (CUSTOM PROCEDURE TRAY) ×2 IMPLANT
KIT TURNOVER KIT B (KITS) ×2 IMPLANT
MATRIX WOUND 3-LAYER 7X10 (Tissue) ×1 IMPLANT
MICROMATRIX 1000MG (Tissue) ×2 IMPLANT
NDL HYPO 25GX1X1/2 BEV (NEEDLE) ×1 IMPLANT
NEEDLE HYPO 25GX1X1/2 BEV (NEEDLE) IMPLANT
NS IRRIG 1000ML POUR BTL (IV SOLUTION) ×2 IMPLANT
PACK GENERAL/GYN (CUSTOM PROCEDURE TRAY) ×2 IMPLANT
PACK UNIVERSAL I (CUSTOM PROCEDURE TRAY) ×2 IMPLANT
PAD ARMBOARD 7.5X6 YLW CONV (MISCELLANEOUS) ×4 IMPLANT
PENCIL BUTTON HOLSTER BLD 10FT (ELECTRODE) ×1 IMPLANT
POWDER MYRIAD MORCELLS 1000MG (Miscellaneous) ×1 IMPLANT
SOLUTION PARTIC MCRMTRX 1000MG (Tissue) IMPLANT
SPONGE T-LAP 18X18 ~~LOC~~+RFID (SPONGE) ×1 IMPLANT
STAPLER VISISTAT 35W (STAPLE) ×3 IMPLANT
SURGILUBE 2OZ TUBE FLIPTOP (MISCELLANEOUS) ×1 IMPLANT
SUT MNCRL AB 3-0 PS2 27 (SUTURE) IMPLANT
SUT MNCRL AB 4-0 PS2 18 (SUTURE) IMPLANT
SUT MON AB 2-0 CT1 36 (SUTURE) IMPLANT
SUT MON AB 5-0 PS2 18 (SUTURE) IMPLANT
SUT VIC AB 5-0 PS2 18 (SUTURE) ×2 IMPLANT
SUT VICRYL 3 0 (SUTURE) IMPLANT
SWAB COLLECTION DEVICE MRSA (MISCELLANEOUS) IMPLANT
SWAB CULTURE ESWAB REG 1ML (MISCELLANEOUS) IMPLANT
SYR BULB IRRIG 60ML STRL (SYRINGE) ×1 IMPLANT
SYR CONTROL 10ML LL (SYRINGE) ×1 IMPLANT
TOWEL GREEN STERILE (TOWEL DISPOSABLE) ×2 IMPLANT
TUBING CONNECTING 10 (TUBING) ×1 IMPLANT
UNDERPAD 30X36 HEAVY ABSORB (UNDERPADS AND DIAPERS) ×2 IMPLANT
YANKAUER SUCT BULB TIP NO VENT (SUCTIONS) ×1 IMPLANT

## 2021-09-12 NOTE — Anesthesia Preprocedure Evaluation (Addendum)
Anesthesia Evaluation  ?Patient identified by MRN, date of birth, ID band ?Patient awake ? ? ? ?Reviewed: ?Allergy & Precautions, H&P , NPO status , Patient's Chart, lab work & pertinent test results ? ?Airway ?Mallampati: II ? ? ?Neck ROM: full ? ? ? Dental ?  ?Pulmonary ?neg pulmonary ROS,  ?  ?breath sounds clear to auscultation ? ? ? ? ? ? Cardiovascular ?negative cardio ROS ? ? ?Rhythm:regular Rate:Normal ? ? ?  ?Neuro/Psych ? Neuromuscular disease   ? GI/Hepatic ?  ?Endo/Other  ? ? Renal/GU ?  ? ?  ?Musculoskeletal ? ? Abdominal ?  ?Peds ? Hematology ?  ?Anesthesia Other Findings ? ? Reproductive/Obstetrics ? ?  ? ? ? ? ? ? ? ? ? ? ? ? ? ?  ?  ? ? ? ? ? ? ? ? ?Anesthesia Physical ?Anesthesia Plan ? ?ASA: 2 ? ?Anesthesia Plan: General  ? ?Post-op Pain Management:   ? ?Induction: Intravenous ? ?PONV Risk Score and Plan: 2 and Ondansetron, Dexamethasone, Midazolam and Treatment may vary due to age or medical condition ? ?Airway Management Planned: LMA ? ?Additional Equipment:  ? ?Intra-op Plan:  ? ?Post-operative Plan: Extubation in OR ? ?Informed Consent: I have reviewed the patients History and Physical, chart, labs and discussed the procedure including the risks, benefits and alternatives for the proposed anesthesia with the patient or authorized representative who has indicated his/her understanding and acceptance.  ? ? ? ?Dental advisory given ? ?Plan Discussed with: CRNA, Anesthesiologist and Surgeon ? ?Anesthesia Plan Comments:   ? ? ? ? ? ? ?Anesthesia Quick Evaluation ? ?

## 2021-09-12 NOTE — Discharge Instructions (Signed)
Keep VAC in place at 125 mmHg pressure continuous ?

## 2021-09-12 NOTE — Anesthesia Procedure Notes (Signed)
Procedure Name: LMA Insertion ?Date/Time: 09/12/2021 3:39 PM ?Performed by: Lance Coon, CRNA ?Pre-anesthesia Checklist: Patient identified, Emergency Drugs available, Suction available, Patient being monitored and Timeout performed ?Patient Re-evaluated:Patient Re-evaluated prior to induction ?Oxygen Delivery Method: Circle system utilized ?Preoxygenation: Pre-oxygenation with 100% oxygen ?Induction Type: IV induction ?LMA: LMA inserted ?LMA Size: 4.0 ?Number of attempts: 1 ?Placement Confirmation: positive ETCO2 and breath sounds checked- equal and bilateral ?Tube secured with: Tape ?Dental Injury: Teeth and Oropharynx as per pre-operative assessment  ? ? ? ? ?

## 2021-09-12 NOTE — Transfer of Care (Signed)
Immediate Anesthesia Transfer of Care Note ? ?Patient: Wayne Hunter ? ?Procedure(s) Performed: IRRIGATION AND DEBRIDEMENT WOUND (Bilateral: Thigh) ?WOUND VAC CHANGE (Bilateral: Thigh) ?APPLICATION OF SKIN SUBSTITUTE- MYRIAD/ACELL (Bilateral: Thigh) ? ?Patient Location: PACU ? ?Anesthesia Type:General ? ?Level of Consciousness: drowsy and patient cooperative ? ?Airway & Oxygen Therapy: Patient Spontanous Breathing ? ?Post-op Assessment: Report given to RN and Post -op Vital signs reviewed and stable ? ?Post vital signs: Reviewed and stable ? ?Last Vitals:  ?Vitals Value Taken Time  ?BP 128/83 09/12/21 1618  ?Temp    ?Pulse 117 09/12/21 1619  ?Resp 11 09/12/21 1619  ?SpO2 100 % 09/12/21 1619  ?Vitals shown include unvalidated device data. ? ?Last Pain:  ?Vitals:  ? 09/12/21 1315  ?TempSrc: Oral  ?   ? ?  ? ?Complications: No notable events documented. ?

## 2021-09-12 NOTE — Interval H&P Note (Signed)
History and Physical Interval Note: ? ?09/12/2021 ?3:19 PM ? ?Wayne Hunter  has presented today for surgery, with the diagnosis of Bilateral thigh wounds.  The various methods of treatment have been discussed with the patient and family. After consideration of risks, benefits and other options for treatment, the patient has consented to  Procedure(s): ?IRRIGATION AND DEBRIDEMENT WOUND (Bilateral) ?WOUND VAC CHANGE (Bilateral) ?APPLICATION OF SKIN SUBSTITUTE- MYRIAD/ACELL (Bilateral) as a surgical intervention.  The patient's history has been reviewed, patient examined, no change in status, stable for surgery.  I have reviewed the patient's chart and labs.  Questions were answered to the patient's satisfaction.   ? ? ?Wayne Hunter ? ? ?

## 2021-09-12 NOTE — Op Note (Signed)
DATE OF OPERATION: 09/12/2021 ? ?LOCATION: [facility] ? ?PREOPERATIVE DIAGNOSIS: Bilateral thigh wounds ? ?POSTOPERATIVE DIAGNOSIS: Same ? ?PROCEDURE:  ?Preparation of right thigh wound 6 x 11 cm for placement of Acell 7 x 10 cm sheet and 1 gm Acell powder ?Preparation of left thigh wound 4 x 10 cm for placement of Myriad 7 x 10 cm sheet and 1 gm powder ?VAC placement to right thigh wound ?VAC placement to left thigh wound ? ?SURGEON: Julie-Anne Torain Sanger Lynniah Janoski, DO ? ?ASSISTANT: Roetta Sessions, PA ? ?EBL: 1 cc ? ?CONDITION: Stable ? ?COMPLICATIONS: None ? ?INDICATION: The patient, Wayne Hunter, is a 26 y.o. male born on 04-Feb-1996, is here for treatment of bilateral thigh wounds.  ? ?PROCEDURE DETAILS:  ?The patient was seen prior to surgery and marked.  The IV antibiotics were given. The patient was taken to the operating room and given a general anesthetic. A standard time out was performed and all information was confirmed by those in the room. SCDs were placed.  The thighs were prepped and draped.   ? ?Right thigh: The 6 x 11 cm wound was irrigated with saline.  All of the Acell powder and sheet was applied.  The sheet was secured with the 5-0 Vicryl.  The Sorbact was placed and secured with the 5-0 Vicryl.  The [place] were applied and there was an excellent seal. ? ?Left thigh: The 4 x 10 cm wound was irrigated with saline.  All of the Myriad sheet and powder were applied and secured with the 5-0 Vicryl.  The Sorbact was placed and secured with the 5-0 Vicryl.  The [facility] were applied and secured with an excellent seal. The patient was allowed to wake up and taken to recovery room in stable condition at the end of the case. The family was notified at the end of the case.  ? ?The advanced practice practitioner (APP) assisted throughout the case.  The APP was essential in retraction and counter traction when needed to make the case progress smoothly.  This retraction and assistance made  it possible to see the tissue plans for the procedure.  The assistance was needed for blood control, tissue re-approximation and assisted with closure of the incision site. ? ?

## 2021-09-13 ENCOUNTER — Ambulatory Visit: Payer: Medicaid Other | Admitting: Plastic Surgery

## 2021-09-13 ENCOUNTER — Encounter (HOSPITAL_COMMUNITY): Payer: Self-pay | Admitting: Plastic Surgery

## 2021-09-13 NOTE — Anesthesia Postprocedure Evaluation (Signed)
Anesthesia Post Note ? ?Patient: Wayne Hunter ? ?Procedure(s) Performed: IRRIGATION AND DEBRIDEMENT WOUND (Bilateral: Thigh) ?WOUND VAC CHANGE (Bilateral: Thigh) ?APPLICATION OF SKIN SUBSTITUTE- MYRIAD/ACELL (Bilateral: Thigh) ? ?  ? ?Patient location during evaluation: PACU ?Anesthesia Type: General ?Level of consciousness: awake and alert ?Pain management: pain level controlled ?Vital Signs Assessment: post-procedure vital signs reviewed and stable ?Respiratory status: spontaneous breathing, nonlabored ventilation, respiratory function stable and patient connected to nasal cannula oxygen ?Cardiovascular status: blood pressure returned to baseline and stable ?Postop Assessment: no apparent nausea or vomiting ?Anesthetic complications: no ? ? ?No notable events documented. ? ?Last Vitals:  ?Vitals:  ? 09/12/21 1650 09/12/21 1705  ?BP: 127/69 130/71  ?Pulse: 92 89  ?Resp: 18 14  ?Temp:    ?SpO2: 99% 99%  ?  ?Last Pain:  ?Vitals:  ? 09/12/21 1705  ?TempSrc:   ?PainSc: 0-No pain  ? ? ?  ?  ?  ?  ?  ?  ? ?[place] ? ? ? ? ?

## 2021-09-19 ENCOUNTER — Ambulatory Visit (INDEPENDENT_AMBULATORY_CARE_PROVIDER_SITE_OTHER): Payer: PRIVATE HEALTH INSURANCE | Admitting: Surgical

## 2021-09-19 ENCOUNTER — Other Ambulatory Visit: Payer: Self-pay | Admitting: Surgical

## 2021-09-19 ENCOUNTER — Other Ambulatory Visit: Payer: Self-pay

## 2021-09-19 ENCOUNTER — Other Ambulatory Visit (HOSPITAL_COMMUNITY): Payer: Self-pay

## 2021-09-19 DIAGNOSIS — S71109D Unspecified open wound, unspecified thigh, subsequent encounter: Secondary | ICD-10-CM

## 2021-09-19 MED ORDER — OXYCODONE-ACETAMINOPHEN 5-325 MG PO TABS
1.0000 | ORAL_TABLET | Freq: Four times a day (QID) | ORAL | 0 refills | Status: DC | PRN
  Filled 2021-09-19: qty 5, 2d supply, fill #0

## 2021-09-19 NOTE — H&P (View-Only) (Signed)
? ?  Referring Provider ?No referring provider defined for this encounter.  ? ?CC:  ?Chief Complaint  ?Patient presents with  ? Follow-up  ?   ? ?Wayne Hunter is an 25 y.o. male.  ?HPI: 25-year-old male here for evaluation of bilateral hip wounds, wound VAC change.  He most recently underwent irrigation and debridement of bilateral hip wounds with application of wound matrix to bilateral hip wounds and application of wound VAC with Dr. Dillingham on 09/12/2021.  He is 1 week postop.  He reports he is doing well, he is not having any infectious symptoms.  He reports pain is improving with each passing week. ? ?He he did take a Percocet prior to today's visit as he has a lot of pain with wound VAC changes.  ? ? ?Review of Systems ?General: No fevers or chills ? ?Physical Exam ?Vitals with BMI 09/12/2021 09/12/2021 09/12/2021  ?Height - - -  ?Weight - - -  ?BMI - - -  ?Systolic 130 127 136  ?Diastolic 71 69 69  ?Pulse 89 92 92  ?  ?General:  No acute distress,  Alert and oriented, Non-Toxic, Normal speech and affect ?Skin: Bilateral hip wounds, left hip wound is 9 x 3 cm and approximately 0.3 cm deep.  Right hip wound is 11 x 6 cm and approximately 2 to 3 cm in depth.  There is good base of granulation tissue noted, new epithelialization noted at the wound edges of the left wound.  No foul odors.  No purulent drainage.  No tenderness with palpation of the periwound area ? ?Assessment/Plan ?We are planning to do a VAC holiday on the left side, recommend Adaptic, K-Y jelly, 4 x 4 gauze, ABD, Medipore tape, Ace wrap dressing changes to the left thigh wound daily. ? ?We change the wound VAC on the right side, patient tolerated this well. ? ?We will schedule for debridement and application of wound matrix on 09/26/2021 with Dr. Dillingham.  All of his questions were answered to his content. ? ?Pictures were obtained of the patient and placed in the chart with the patient's or guardian's permission. ? ?A total of 35 minutes was  spent on today's encounter, greater than 50% of this time was spent in face-to-face contact with the patient.  ? ?Seena Face J Wilburt Messina ?09/19/2021, 1:51 PM  ? ? ?    ?

## 2021-09-19 NOTE — Progress Notes (Signed)
? ?  Referring Provider ?No referring provider defined for this encounter.  ? ?CC:  ?Chief Complaint  ?Patient presents with  ? Follow-up  ?   ? ?Wayne Hunter is an 26 y.o. male.  ?HPI: 26 year old male here for evaluation of bilateral hip wounds, wound VAC change.  He most recently underwent irrigation and debridement of bilateral hip wounds with application of wound matrix to bilateral hip wounds and application of wound VAC with Dr. Marla Roe on 09/12/2021.  He is 1 week postop.  He reports he is doing well, he is not having any infectious symptoms.  He reports pain is improving with each passing week. ? ?He he did take a Percocet prior to today's visit as he has a lot of pain with wound VAC changes.  ? ? ?Review of Systems ?General: No fevers or chills ? ?Physical Exam ?Vitals with BMI 09/12/2021 09/12/2021 09/12/2021  ?Height - - -  ?Weight - - -  ?BMI - - -  ?Systolic [id number]  ?Diastolic 71 69 69  ?Pulse 89 92 92  ?  ?General:  No acute distress,  Alert and oriented, Non-Toxic, Normal speech and affect ?Skin: Bilateral hip wounds, left hip wound is 9 x 3 cm and approximately 0.3 cm deep.  Right hip wound is 11 x 6 cm and approximately 2 to 3 cm in depth.  There is good base of granulation tissue noted, new epithelialization noted at the wound edges of the left wound.  No foul odors.  No purulent drainage.  No tenderness with palpation of the periwound area ? ?Assessment/Plan ?We are planning to do a VAC holiday on the left side, recommend Adaptic, K-Y jelly, 4 x 4 gauze, ABD, Medipore tape, Ace wrap dressing changes to the left thigh wound daily. ? ?We change the wound VAC on the right side, patient tolerated this well. ? ?We will schedule for debridement and application of wound matrix on 09/26/2021 with Dr. Marla Roe.  All of his questions were answered to his content. ? ?Pictures were obtained of the patient and placed in the chart with the patient's or guardian's permission. ? ?A total of 35 minutes was  spent on today's encounter, greater than 50% of this time was spent in face-to-face contact with the patient.  ? ?Carola Rhine Rickayla Wieland ?09/19/2021, 1:51 PM  ? ? ?    ?

## 2021-09-20 ENCOUNTER — Telehealth: Payer: Self-pay

## 2021-09-20 NOTE — Telephone Encounter (Signed)
Gwen from [facility] called, they need wound measurements in order to supply the patient. They need this today as the order will run out this weekend. Gwen said you can call with measurements or fax it to her [phone] Att: Gwen.  ?

## 2021-09-23 NOTE — Telephone Encounter (Signed)
Spoke to Bud with [employer] and gave measurements that were taken with most recent visit (3/9) along with PA name and Assessment/Plan. ?

## 2021-09-24 ENCOUNTER — Other Ambulatory Visit: Payer: Self-pay

## 2021-09-24 ENCOUNTER — Encounter (HOSPITAL_COMMUNITY): Payer: Self-pay | Admitting: Plastic Surgery

## 2021-09-24 NOTE — Progress Notes (Signed)
Spoke with pt's wife, Wayne Hunter. I have spoken with her several times for previous surgeries. She states Wayne Hunter' medical history has not changed. He is on Eliquis and they have been instructed to hold as of this evening. Tonight's dose will be his last dose prior to surgery.  ? ? ?

## 2021-09-25 ENCOUNTER — Telehealth: Payer: Self-pay | Admitting: *Deleted

## 2021-09-25 NOTE — Telephone Encounter (Signed)
Received on (09/18/21) via of fax 50M VAC Wound Progress Tracking from [facility].  Requesting monthly wound assessments for re-authorization of VAC Therapy.  Given to provider to complete,sign and return.   ? ?The 50M VAC Wound Progress Tracking completed,signed, and faxed back to [facility].  Confirmation received and copy scanned into the chart.//AB/CMA ?

## 2021-09-26 ENCOUNTER — Telehealth: Payer: Self-pay | Admitting: Plastic Surgery

## 2021-09-26 ENCOUNTER — Other Ambulatory Visit: Payer: Self-pay

## 2021-09-26 ENCOUNTER — Ambulatory Visit (HOSPITAL_COMMUNITY): Payer: PRIVATE HEALTH INSURANCE | Admitting: Anesthesiology

## 2021-09-26 ENCOUNTER — Other Ambulatory Visit (HOSPITAL_COMMUNITY): Payer: Self-pay

## 2021-09-26 ENCOUNTER — Encounter: Payer: Self-pay | Admitting: Vascular Surgery

## 2021-09-26 ENCOUNTER — Ambulatory Visit (INDEPENDENT_AMBULATORY_CARE_PROVIDER_SITE_OTHER): Payer: PRIVATE HEALTH INSURANCE | Admitting: Vascular Surgery

## 2021-09-26 ENCOUNTER — Ambulatory Visit (HOSPITAL_COMMUNITY)
Admission: RE | Admit: 2021-09-26 | Discharge: 2021-09-26 | Disposition: A | Discharge status: Home / Self Care | Payer: PRIVATE HEALTH INSURANCE | Attending: Plastic Surgery | Admitting: Plastic Surgery

## 2021-09-26 ENCOUNTER — Encounter: Payer: Medicaid Other | Admitting: Surgical

## 2021-09-26 ENCOUNTER — Ambulatory Visit (HOSPITAL_BASED_OUTPATIENT_CLINIC_OR_DEPARTMENT_OTHER): Payer: PRIVATE HEALTH INSURANCE | Admitting: Anesthesiology

## 2021-09-26 ENCOUNTER — Encounter (HOSPITAL_COMMUNITY): Payer: Self-pay | Admitting: Plastic Surgery

## 2021-09-26 ENCOUNTER — Ambulatory Visit (HOSPITAL_COMMUNITY)
Admission: RE | Admit: 2021-09-26 | Discharge: 2021-09-26 | Disposition: A | Discharge status: Home / Self Care | Payer: PRIVATE HEALTH INSURANCE | Source: Ambulatory Visit | Attending: Vascular Surgery | Admitting: Vascular Surgery

## 2021-09-26 ENCOUNTER — Encounter (HOSPITAL_COMMUNITY)
Admission: RE | Disposition: A | Discharge status: Home / Self Care | Payer: Self-pay | Source: Home / Self Care | Attending: Plastic Surgery

## 2021-09-26 VITALS — BP 118/70 | HR 82 | Temp 98.7°F | Resp 20 | Ht 66.0 in | Wt 203.0 lb

## 2021-09-26 DIAGNOSIS — X58XXXA Exposure to other specified factors, initial encounter: Secondary | ICD-10-CM | POA: Diagnosis not present

## 2021-09-26 DIAGNOSIS — S71102A Unspecified open wound, left thigh, initial encounter: Secondary | ICD-10-CM | POA: Diagnosis not present

## 2021-09-26 DIAGNOSIS — S71102D Unspecified open wound, left thigh, subsequent encounter: Secondary | ICD-10-CM | POA: Diagnosis not present

## 2021-09-26 DIAGNOSIS — I82412 Acute embolism and thrombosis of left femoral vein: Secondary | ICD-10-CM | POA: Diagnosis not present

## 2021-09-26 DIAGNOSIS — Z86718 Personal history of other venous thrombosis and embolism: Secondary | ICD-10-CM | POA: Diagnosis not present

## 2021-09-26 DIAGNOSIS — S71101A Unspecified open wound, right thigh, initial encounter: Secondary | ICD-10-CM

## 2021-09-26 DIAGNOSIS — S71101D Unspecified open wound, right thigh, subsequent encounter: Secondary | ICD-10-CM | POA: Diagnosis not present

## 2021-09-26 HISTORY — PX: INCISION AND DRAINAGE OF WOUND: SHX1803

## 2021-09-26 HISTORY — PX: APPLICATION OF WOUND VAC: SHX5189

## 2021-09-26 SURGERY — IRRIGATION AND DEBRIDEMENT WOUND
Anesthesia: General | Site: Thigh | Laterality: Bilateral

## 2021-09-26 MED ORDER — DEXAMETHASONE SODIUM PHOSPHATE 10 MG/ML IJ SOLN
INTRAMUSCULAR | Status: DC | PRN
  Administered 2021-09-26: 10 mg via INTRAVENOUS

## 2021-09-26 MED ORDER — ONDANSETRON HCL 4 MG/2ML IJ SOLN
INTRAMUSCULAR | Status: DC | PRN
  Administered 2021-09-26: 4 mg via INTRAVENOUS

## 2021-09-26 MED ORDER — 0.9 % SODIUM CHLORIDE (POUR BTL) OPTIME
TOPICAL | Status: DC | PRN
  Administered 2021-09-26: 1000 mL

## 2021-09-26 MED ORDER — CEFAZOLIN SODIUM-DEXTROSE 2-4 GM/100ML-% IV SOLN: 2.0000 g | INTRAVENOUS | Status: DC

## 2021-09-26 MED ORDER — ACETAMINOPHEN 10 MG/ML IV SOLN
INTRAVENOUS | Status: AC
  Filled 2021-09-26: qty 100

## 2021-09-26 MED ORDER — AMISULPRIDE (ANTIEMETIC) 5 MG/2ML IV SOLN: 10.0000 mg | Freq: Once | INTRAVENOUS | Status: DC | PRN

## 2021-09-26 MED ORDER — OXYCODONE-ACETAMINOPHEN 7.5-325 MG PO TABS
1.0000 | ORAL_TABLET | Freq: Three times a day (TID) | ORAL | 0 refills | Status: AC | PRN
  Filled 2021-09-26: qty 21, 7d supply, fill #0

## 2021-09-26 MED ORDER — ORAL CARE MOUTH RINSE: 15.0000 mL | Freq: Once | OROMUCOSAL | Status: AC

## 2021-09-26 MED ORDER — LIDOCAINE 2% (20 MG/ML) 5 ML SYRINGE
INTRAMUSCULAR | Status: DC | PRN
  Administered 2021-09-26: 80 mg via INTRAVENOUS

## 2021-09-26 MED ORDER — CHLORHEXIDINE GLUCONATE 0.12 % MT SOLN
OROMUCOSAL | Status: AC
  Administered 2021-09-26: 15 mL via OROMUCOSAL
  Filled 2021-09-26: qty 15

## 2021-09-26 MED ORDER — CEFAZOLIN SODIUM-DEXTROSE 2-4 GM/100ML-% IV SOLN
INTRAVENOUS | Status: AC
  Filled 2021-09-26: qty 100

## 2021-09-26 MED ORDER — DEXAMETHASONE SODIUM PHOSPHATE 10 MG/ML IJ SOLN
INTRAMUSCULAR | Status: AC
  Filled 2021-09-26: qty 1

## 2021-09-26 MED ORDER — MIDAZOLAM HCL 2 MG/2ML IJ SOLN
INTRAMUSCULAR | Status: AC
  Filled 2021-09-26: qty 2

## 2021-09-26 MED ORDER — FENTANYL CITRATE (PF) 250 MCG/5ML IJ SOLN
INTRAMUSCULAR | Status: DC | PRN
  Administered 2021-09-26 (×3): 50 ug via INTRAVENOUS

## 2021-09-26 MED ORDER — MIDAZOLAM HCL 2 MG/2ML IJ SOLN
INTRAMUSCULAR | Status: DC | PRN
  Administered 2021-09-26: 2 mg via INTRAVENOUS

## 2021-09-26 MED ORDER — CHLORHEXIDINE GLUCONATE CLOTH 2 % EX PADS: 6.0000 | MEDICATED_PAD | Freq: Once | CUTANEOUS | Status: DC

## 2021-09-26 MED ORDER — ACETAMINOPHEN 10 MG/ML IV SOLN
INTRAVENOUS | Status: DC | PRN
  Administered 2021-09-26: 1000 mg via INTRAVENOUS

## 2021-09-26 MED ORDER — CHLORHEXIDINE GLUCONATE 0.12 % MT SOLN: 15.0000 mL | Freq: Once | OROMUCOSAL | Status: AC

## 2021-09-26 MED ORDER — ONDANSETRON HCL 4 MG/2ML IJ SOLN
INTRAMUSCULAR | Status: AC
  Filled 2021-09-26: qty 2

## 2021-09-26 MED ORDER — OXYCODONE HCL 5 MG PO TABS: 5.0000 mg | ORAL_TABLET | Freq: Once | ORAL | Status: DC | PRN

## 2021-09-26 MED ORDER — PROPOFOL 10 MG/ML IV BOLUS
INTRAVENOUS | Status: AC
  Filled 2021-09-26: qty 20

## 2021-09-26 MED ORDER — APIXABAN 2.5 MG PO TABS: 5.0000 mg | ORAL_TABLET | Freq: Two times a day (BID) | ORAL | Status: AC

## 2021-09-26 MED ORDER — FENTANYL CITRATE (PF) 100 MCG/2ML IJ SOLN: 25.0000 ug | INTRAMUSCULAR | Status: DC | PRN

## 2021-09-26 MED ORDER — FENTANYL CITRATE (PF) 250 MCG/5ML IJ SOLN
INTRAMUSCULAR | Status: AC
  Filled 2021-09-26: qty 5

## 2021-09-26 MED ORDER — PROPOFOL 10 MG/ML IV BOLUS
INTRAVENOUS | Status: DC | PRN
  Administered 2021-09-26: 200 mg via INTRAVENOUS
  Administered 2021-09-26 (×2): 30 mg via INTRAVENOUS

## 2021-09-26 MED ORDER — ONDANSETRON HCL 4 MG/2ML IJ SOLN: 4.0000 mg | Freq: Once | INTRAMUSCULAR | Status: DC | PRN

## 2021-09-26 MED ORDER — OXYCODONE HCL 5 MG/5ML PO SOLN: 5.0000 mg | Freq: Once | ORAL | Status: DC | PRN

## 2021-09-26 MED ORDER — LACTATED RINGERS IV SOLN: INTRAVENOUS | Status: DC

## 2021-09-26 SURGICAL SUPPLY — 58 items
APPLICATOR COTTON TIP 6 STRL (MISCELLANEOUS) IMPLANT
APPLICATOR COTTON TIP 6IN STRL (MISCELLANEOUS)
BAG COUNTER SPONGE SURGICOUNT (BAG) ×1 IMPLANT
BAG DECANTER FOR FLEXI CONT (MISCELLANEOUS) IMPLANT
BENZOIN TINCTURE PRP APPL 2/3 (GAUZE/BANDAGES/DRESSINGS) ×2 IMPLANT
CANISTER SUCT 3000ML PPV (MISCELLANEOUS) ×2 IMPLANT
CNTNR URN SCR LID CUP LEK RST (MISCELLANEOUS) IMPLANT
CONT SPEC 4OZ STRL OR WHT (MISCELLANEOUS)
COVER SURGICAL LIGHT HANDLE (MISCELLANEOUS) ×2 IMPLANT
DRAIN CHANNEL 19F RND (DRAIN) IMPLANT
DRAIN JP 10F RND SILICONE (MISCELLANEOUS) IMPLANT
DRAPE DERMATAC (DRAPES) ×2 IMPLANT
DRAPE HALF SHEET 40X57 (DRAPES) ×1 IMPLANT
DRAPE IMP U-DRAPE 54X76 (DRAPES) ×2 IMPLANT
DRAPE INCISE IOBAN 66X45 STRL (DRAPES) IMPLANT
DRAPE LAPAROSCOPIC ABDOMINAL (DRAPES) IMPLANT
DRAPE LAPAROTOMY 100X72 PEDS (DRAPES) ×1 IMPLANT
DRSG ADAPTIC 3X8 NADH LF (GAUZE/BANDAGES/DRESSINGS) IMPLANT
DRSG CUTIMED SORBACT 7X9 (GAUZE/BANDAGES/DRESSINGS) ×2 IMPLANT
DRSG PAD ABDOMINAL 8X10 ST (GAUZE/BANDAGES/DRESSINGS) IMPLANT
DRSG VAC ATS LRG SENSATRAC (GAUZE/BANDAGES/DRESSINGS) IMPLANT
DRSG VAC ATS MED SENSATRAC (GAUZE/BANDAGES/DRESSINGS) IMPLANT
DRSG VAC ATS SM SENSATRAC (GAUZE/BANDAGES/DRESSINGS) ×2 IMPLANT
ELECT CAUTERY BLADE 6.4 (BLADE) IMPLANT
ELECT REM PT RETURN 9FT ADLT (ELECTROSURGICAL) ×2
ELECTRODE REM PT RTRN 9FT ADLT (ELECTROSURGICAL) ×1 IMPLANT
GAUZE SPONGE 4X4 12PLY STRL (GAUZE/BANDAGES/DRESSINGS) IMPLANT
GLOVE SURG ENC MOIS LTX SZ6.5 (GLOVE) ×2 IMPLANT
GLOVE SURG ENC TEXT LTX SZ6.5 (GLOVE) ×2 IMPLANT
GOWN STRL REUS W/ TWL LRG LVL3 (GOWN DISPOSABLE) ×3 IMPLANT
GOWN STRL REUS W/TWL LRG LVL3 (GOWN DISPOSABLE) ×3
KIT BASIN OR (CUSTOM PROCEDURE TRAY) ×2 IMPLANT
KIT TURNOVER KIT B (KITS) ×2 IMPLANT
MATRIX WOUND 3-LAYER 7X10 (Tissue) ×1 IMPLANT
MICROMATRIX 500MG (Tissue) ×2 IMPLANT
NDL HYPO 25GX1X1/2 BEV (NEEDLE) ×1 IMPLANT
NEEDLE HYPO 25GX1X1/2 BEV (NEEDLE) IMPLANT
NS IRRIG 1000ML POUR BTL (IV SOLUTION) ×2 IMPLANT
PACK GENERAL/GYN (CUSTOM PROCEDURE TRAY) ×2 IMPLANT
PACK UNIVERSAL I (CUSTOM PROCEDURE TRAY) ×2 IMPLANT
PAD ARMBOARD 7.5X6 YLW CONV (MISCELLANEOUS) ×4 IMPLANT
POWDER MYRIAD MORCELLS 500MG (Miscellaneous) ×1 IMPLANT
SOLUTION PARTIC MCRMTRX 500MG (Tissue) IMPLANT
STAPLER VISISTAT 35W (STAPLE) ×1 IMPLANT
SURGILUBE 2OZ TUBE FLIPTOP (MISCELLANEOUS) ×1 IMPLANT
SUT MNCRL AB 3-0 PS2 27 (SUTURE) IMPLANT
SUT MNCRL AB 4-0 PS2 18 (SUTURE) IMPLANT
SUT MON AB 2-0 CT1 36 (SUTURE) IMPLANT
SUT MON AB 5-0 PS2 18 (SUTURE) IMPLANT
SUT VIC AB 3-0 PS2 18 (SUTURE) ×1
SUT VIC AB 3-0 PS2 18XBRD (SUTURE) IMPLANT
SUT VIC AB 5-0 PS2 18 (SUTURE) ×1 IMPLANT
SUT VICRYL 3 0 (SUTURE) IMPLANT
SWAB COLLECTION DEVICE MRSA (MISCELLANEOUS) IMPLANT
SWAB CULTURE ESWAB REG 1ML (MISCELLANEOUS) IMPLANT
SYR CONTROL 10ML LL (SYRINGE) ×1 IMPLANT
TOWEL GREEN STERILE (TOWEL DISPOSABLE) ×2 IMPLANT
UNDERPAD 30X36 HEAVY ABSORB (UNDERPADS AND DIAPERS) ×2 IMPLANT
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
## 2021-09-26 NOTE — Anesthesia Preprocedure Evaluation (Addendum)
Anesthesia Evaluation  ?Patient identified by MRN, date of birth, ID band ?Patient awake ? ? ? ?Reviewed: ?Allergy & Precautions, NPO status , Patient's Chart, lab work & pertinent test results ? ?Airway ?Mallampati: II ? ?TM Distance: >3 FB ?Neck ROM: Full ? ? ? Dental ?no notable dental hx. ?(+) Teeth Intact, Dental Advisory Given ?  ?Pulmonary ?neg pulmonary ROS,  ?  ?Pulmonary exam normal ?breath sounds clear to auscultation ? ? ? ? ? ? Cardiovascular ?+ DVT (on eliquis, last dose 09/23/21)  ?Normal cardiovascular exam ?Rhythm:Regular Rate:Normal ? ? ?  ?Neuro/Psych ? Neuromuscular disease (h/o rhabdomyolysis) negative psych ROS  ? GI/Hepatic ?negative GI ROS, Neg liver ROS,   ?Endo/Other  ?negative endocrine ROS ? Renal/GU ?negative Renal ROS  ?negative genitourinary ?  ?Musculoskeletal ?negative musculoskeletal ROS ?(+)  ? Abdominal ?  ?Peds ?negative pediatric ROS ?(+)  Hematology ? ?(+) Blood dyscrasia, anemia ,   ?Anesthesia Other Findings ?H/o crush injury and DVT ? Reproductive/Obstetrics ?negative OB ROS ? ?  ? ? ? ? ? ? ? ? ? ? ? ? ? ?  ?  ? ? ? ? ? ? ? ?Anesthesia Physical ?Anesthesia Plan ? ?ASA: 2 ? ?Anesthesia Plan: General  ? ?Post-op Pain Management: Minimal or no pain anticipated  ? ?Induction: Intravenous ? ?PONV Risk Score and Plan: 2 and Treatment may vary due to age or medical condition ? ?Airway Management Planned: LMA ? ?Additional Equipment: None ? ?Intra-op Plan:  ? ?Post-operative Plan: Extubation in OR ? ?Informed Consent: I have reviewed the patients History and Physical, chart, labs and discussed the procedure including the risks, benefits and alternatives for the proposed anesthesia with the patient or authorized representative who has indicated his/her understanding and acceptance.  ? ? ? ?Dental advisory given ? ?Plan Discussed with: CRNA, Anesthesiologist and Surgeon ? ?Anesthesia Plan Comments:   ? ? ? ? ? ? ?Anesthesia Quick Evaluation ? ?

## 2021-09-26 NOTE — Telephone Encounter (Signed)
Pain med requested for post op ?

## 2021-09-26 NOTE — Transfer of Care (Signed)
Immediate Anesthesia Transfer of Care Note ? ?Patient: Wayne Hunter ? ?Procedure(s) Performed: IRRIGATION AND DEBRIDEMENT WOUND (Bilateral: Thigh) ?APPLICATION OF WOUND VAC (Bilateral: Thigh) ?APPLICATION OF SKIN SUBSTITUTE (Bilateral: Thigh) ? ?Patient Location: PACU ? ?Anesthesia Type:General ? ?Level of Consciousness: oriented, drowsy and patient cooperative ? ?Airway & Oxygen Therapy: Patient Spontanous Breathing and Patient connected to nasal cannula oxygen ? ?Post-op Assessment: Report given to RN and Post -op Vital signs reviewed and stable ? ?Post vital signs: Reviewed ? ?Last Vitals:  ?Vitals Value Taken Time  ?BP 122/73 09/26/21 1545  ?Temp    ?Pulse 92 09/26/21 1546  ?Resp 16 09/26/21 1546  ?SpO2 100 % 09/26/21 1546  ?Vitals shown include unvalidated device data. ? ?Last Pain:  ?Vitals:  ? 09/26/21 1150  ?TempSrc:   ?PainSc: 0-No pain  ?   ? ?  ? ?Complications: No notable events documented. ?

## 2021-09-26 NOTE — Op Note (Signed)
DATE OF OPERATION: 09/26/2021 ? ?LOCATION: [facility] ? ?PREOPERATIVE DIAGNOSIS: Bilateral thigh wounds ? ?POSTOPERATIVE DIAGNOSIS: Same ? ?PROCEDURE:  ?Preparation of left thigh wound 3 x 7 cm for placement of Myriad powder 1 gm and VAC placement  ?2.   Preparation of right thigh wound 4 x 10 cm for placement of Acell powder 500 mg and 7 x 10 cm sheet and VAC ? ?SURGEON: Idell Hissong Sanger Sofya Moustafa, DO ? ?ASSISTANT: Dr. Lennice Sites ? ?EBL: none ? ?CONDITION: Stable ? ?COMPLICATIONS: None ? ?INDICATION: The patient, Wayne Hunter, is a 26 y.o. male born on 06-25-1996, is here for treatment of bilateral thigh wounds.  ? ?PROCEDURE DETAILS:  ?The patient was seen prior to surgery and marked.  The IV antibiotics were given. The patient was taken to the operating room and given a general anesthetic. A standard time out was performed and all information was confirmed by those in the room. Both legs/thighs were prepped and draped with betadine and draped in the sterile fashion. ? ?Right thigh:  The right wound was irrigated with saline.  It was 4 x 10 cm.  All of the Acell powder and sheet was applied and sutured in place with the 5-0 Vicryl.  The sorbact was applied.  The VAC was placed and had an excellent seal. ? ?Left thigh:  The left thigh wound was irrigated with saline.  It was 3 x 7 cm.  All of the myriad powder was applied.  The sorbact was placed with the VAC.  There was an excellent seal.  The patient was allowed to wake up and taken to recovery room in stable condition at the end of the case. The family was notified at the end of the case.  ? ?Dr. Erin Hearing assisted throughout the case.  He was essential in retraction and counter traction when needed to make the case progress smoothly.  This retraction and assistance made it possible to see the tissue plans for the procedure.  The assistance was needed for blood control, tissue re-approximation and assisted with closure of the incision  site. ? ?

## 2021-09-26 NOTE — Interval H&P Note (Signed)
History and Physical Interval Note: ? ?09/26/2021 ?2:27 PM ? ?Wayne Hunter  has presented today for surgery, with the diagnosis of Bilateral Thigh Wounds.  The various methods of treatment have been discussed with the patient and family. After consideration of risks, benefits and other options for treatment, the patient has consented to  Procedure(s) with comments: ?IRRIGATION AND DEBRIDEMENT WOUND (Bilateral) - 45 minutes ?APPLICATION OF WOUND VAC (Bilateral) ?APPLICATION OF SKIN SUBSTITUTE (Bilateral) as a surgical intervention.  The patient's history has been reviewed, patient examined, no change in status, stable for surgery.  I have reviewed the patient's chart and labs.  Questions were answered to the patient's satisfaction.   ? ? ?Alena Bills Taahir Grisby ? ? ?

## 2021-09-26 NOTE — Discharge Instructions (Addendum)
Keep VAC connected at 125 mmHg pressure continuous. ?

## 2021-09-26 NOTE — Anesthesia Procedure Notes (Addendum)
Procedure Name: LMA Insertion ?Date/Time: 09/26/2021 2:38 PM ?Performed by: Lovie Chol, CRNA ?Pre-anesthesia Checklist: Patient identified, Emergency Drugs available, Suction available and Patient being monitored ?Patient Re-evaluated:Patient Re-evaluated prior to induction ?Oxygen Delivery Method: Circle System Utilized ?Preoxygenation: Pre-oxygenation with 100% oxygen ?Induction Type: IV induction ?Ventilation: Mask ventilation without difficulty ?LMA: LMA inserted ?LMA Size: 4.0 ?Number of attempts: 1 ?Airway Equipment and Method: Bite block ?Placement Confirmation: positive ETCO2 and breath sounds checked- equal and bilateral ?Tube secured with: Tape ?Dental Injury: Teeth and Oropharynx as per pre-operative assessment  ? ? ? ? ?

## 2021-09-27 ENCOUNTER — Encounter (HOSPITAL_COMMUNITY): Payer: Self-pay | Admitting: Plastic Surgery

## 2021-09-30 ENCOUNTER — Other Ambulatory Visit: Payer: Self-pay | Admitting: *Deleted

## 2021-09-30 DIAGNOSIS — I82412 Acute embolism and thrombosis of left femoral vein: Secondary | ICD-10-CM

## 2021-09-30 NOTE — Anesthesia Postprocedure Evaluation (Signed)
Anesthesia Post Note ? ?Patient: JATHEN SUDANO ? ?Procedure(s) Performed: IRRIGATION AND DEBRIDEMENT WOUND (Bilateral: Thigh) ?APPLICATION OF WOUND VAC (Bilateral: Thigh) ?APPLICATION OF SKIN SUBSTITUTE (Bilateral: Thigh) ? ?  ? ?Patient location during evaluation: PACU ?Anesthesia Type: General ?Level of consciousness: awake ?Pain management: pain level controlled ?Vital Signs Assessment: post-procedure vital signs reviewed and stable ?Respiratory status: spontaneous breathing and respiratory function stable ?Cardiovascular status: stable ?Postop Assessment: no apparent nausea or vomiting ?Anesthetic complications: no ? ? ?No notable events documented. ? ?Last Vitals:  ?Vitals:  ? 09/26/21 1600 09/26/21 1615  ?BP: 121/63 117/71  ?Pulse: 75 79  ?Resp: (!) 22 (!) 24  ?Temp:  36.4 ?C  ?SpO2: 99% 100%  ?  ?Last Pain:  ?Vitals:  ? 09/26/21 1615  ?TempSrc:   ?PainSc: 0-No pain  ? ? ?  ?  ?  ?  ?  ?  ? ?Mellody Dance ? ? ? ? ?

## 2021-10-04 ENCOUNTER — Ambulatory Visit (INDEPENDENT_AMBULATORY_CARE_PROVIDER_SITE_OTHER): Payer: PRIVATE HEALTH INSURANCE | Admitting: Surgical

## 2021-10-04 ENCOUNTER — Other Ambulatory Visit: Payer: Self-pay

## 2021-10-04 DIAGNOSIS — S71109D Unspecified open wound, unspecified thigh, subsequent encounter: Secondary | ICD-10-CM | POA: Diagnosis not present

## 2021-10-04 NOTE — Progress Notes (Signed)
? ?  Referring Provider ?No referring provider defined for this encounter.  ? ?CC: No chief complaint on file. ?   ? ?KSHAUN GARGAN is an 26 y.o. male.  ?HPI: 26 year old male here for evaluation of bilateral hip wounds and wound VAC change.  He most recently underwent irrigation and debridement of bilateral thigh wounds with application of wound matrix and bilateral wound VAC on 09/26/2021.  He is 8 days postop. ? ?He reports he is doing well.  He is here with his spouse.  He has not had any issues with the wound vacs.  He is overall feeling well. ? ? ?Review of Systems ?General: No fevers or chills ? ?Physical Exam ? ?  09/26/2021  ?  4:15 PM 09/26/2021  ?  4:00 PM 09/26/2021  ?  3:45 PM  ?Vitals with BMI  ?Systolic 123XX123 123XX123 123XX123  ?Diastolic 71 63 73  ?Pulse 79 75 99  ?  ?General:  No acute distress,  Alert and oriented, Non-Toxic, Normal speech and affect ?Bilateral lower extremity: Bilateral lower extremity wounds a good base of granulation tissue noted.  No erythema or cellulitic changes.  No foul odor is noted. ? ? ? ? ? ? ?Assessment/Plan ?We will do a wound VAC holiday for the next 10 days.  Recommend Adaptic, K-Y jelly, 4 x 4 gauze, ABD, Medipore tape dressing changes daily.  Can cover this with Ace wrap to help secure. ? ?There is no signs of infection.  We are planning for possible skin graft on 10/16/2021 with Dr. Marla Roe. ? ?We discussed skin graft today, we discussed the risks and benefits of skin grafting.   ?We discussed the risks such as poor take of the graft, need for additional procedures, pigmentation concerns and changes.  Patient is aware to bring wound VAC with him to surgery.  We discussed postoperative care. ? ?Pictures were obtained of the patient and placed in the chart with the patient's or guardian's permission. ? ?We will discuss holding Eliquis prior to surgery with VVS. ? ?Carola Rhine Faiga Stones ?10/04/2021, 10:38 AM  ? ? ?    ?

## 2021-10-04 NOTE — H&P (View-Only) (Signed)
? ?  Referring Provider ?No referring provider defined for this encounter.  ? ?CC: No chief complaint on file. ?   ? ?Wayne Hunter is an 25 y.o. male.  ?HPI: 25-year-old male here for evaluation of bilateral hip wounds and wound VAC change.  He most recently underwent irrigation and debridement of bilateral thigh wounds with application of wound matrix and bilateral wound VAC on 09/26/2021.  He is 8 days postop. ? ?He reports he is doing well.  He is here with his spouse.  He has not had any issues with the wound vacs.  He is overall feeling well. ? ? ?Review of Systems ?General: No fevers or chills ? ?Physical Exam ? ?  09/26/2021  ?  4:15 PM 09/26/2021  ?  4:00 PM 09/26/2021  ?  3:45 PM  ?Vitals with BMI  ?Systolic 117 121 122  ?Diastolic 71 63 73  ?Pulse 79 75 99  ?  ?General:  No acute distress,  Alert and oriented, Non-Toxic, Normal speech and affect ?Bilateral lower extremity: Bilateral lower extremity wounds a good base of granulation tissue noted.  No erythema or cellulitic changes.  No foul odor is noted. ? ? ? ? ? ? ?Assessment/Plan ?We will do a wound VAC holiday for the next 10 days.  Recommend Adaptic, K-Y jelly, 4 x 4 gauze, ABD, Medipore tape dressing changes daily.  Can cover this with Ace wrap to help secure. ? ?There is no signs of infection.  We are planning for possible skin graft on 10/16/2021 with Dr. Dillingham. ? ?We discussed skin graft today, we discussed the risks and benefits of skin grafting.   ?We discussed the risks such as poor take of the graft, need for additional procedures, pigmentation concerns and changes.  Patient is aware to bring wound VAC with him to surgery.  We discussed postoperative care. ? ?Pictures were obtained of the patient and placed in the chart with the patient's or guardian's permission. ? ?We will discuss holding Eliquis prior to surgery with VVS. ? ?Wayne Hunter ?10/04/2021, 10:38 AM  ? ? ?    ?

## 2021-10-11 ENCOUNTER — Other Ambulatory Visit (HOSPITAL_COMMUNITY): Payer: Self-pay

## 2021-10-11 ENCOUNTER — Other Ambulatory Visit: Payer: Self-pay | Admitting: Surgical

## 2021-10-11 MED ORDER — ENOXAPARIN SODIUM 40 MG/0.4ML IJ SOSY
40.0000 mg | PREFILLED_SYRINGE | INTRAMUSCULAR | 0 refills | Status: DC
  Filled 2021-10-11: qty 0.8, 2d supply, fill #0

## 2021-10-14 ENCOUNTER — Other Ambulatory Visit (HOSPITAL_COMMUNITY): Payer: Self-pay

## 2021-10-15 ENCOUNTER — Other Ambulatory Visit: Payer: Self-pay

## 2021-10-15 ENCOUNTER — Encounter (HOSPITAL_COMMUNITY): Payer: Self-pay | Admitting: Plastic Surgery

## 2021-10-16 ENCOUNTER — Encounter (HOSPITAL_COMMUNITY)
Admission: RE | Disposition: A | Discharge status: Home / Self Care | Payer: Self-pay | Source: Home / Self Care | Attending: Plastic Surgery

## 2021-10-16 ENCOUNTER — Other Ambulatory Visit (HOSPITAL_COMMUNITY): Payer: Self-pay

## 2021-10-16 ENCOUNTER — Encounter (HOSPITAL_COMMUNITY): Payer: Self-pay | Admitting: Plastic Surgery

## 2021-10-16 ENCOUNTER — Ambulatory Visit (HOSPITAL_COMMUNITY)
Admission: RE | Admit: 2021-10-16 | Discharge: 2021-10-16 | Disposition: A | Discharge status: Home / Self Care | Payer: PRIVATE HEALTH INSURANCE | Attending: Plastic Surgery | Admitting: Plastic Surgery

## 2021-10-16 ENCOUNTER — Other Ambulatory Visit: Payer: Self-pay

## 2021-10-16 ENCOUNTER — Ambulatory Visit (HOSPITAL_COMMUNITY): Payer: PRIVATE HEALTH INSURANCE | Admitting: Anesthesiology

## 2021-10-16 DIAGNOSIS — S71101A Unspecified open wound, right thigh, initial encounter: Secondary | ICD-10-CM | POA: Diagnosis not present

## 2021-10-16 DIAGNOSIS — Z86718 Personal history of other venous thrombosis and embolism: Secondary | ICD-10-CM | POA: Diagnosis not present

## 2021-10-16 DIAGNOSIS — S71102A Unspecified open wound, left thigh, initial encounter: Secondary | ICD-10-CM | POA: Insufficient documentation

## 2021-10-16 DIAGNOSIS — X58XXXA Exposure to other specified factors, initial encounter: Secondary | ICD-10-CM | POA: Diagnosis not present

## 2021-10-16 DIAGNOSIS — Z538 Procedure and treatment not carried out for other reasons: Secondary | ICD-10-CM | POA: Insufficient documentation

## 2021-10-16 LAB — CBC
HCT: 43.7 % (ref 39.0–52.0)
Hemoglobin: 13.2 g/dL (ref 13.0–17.0)
MCH: 24.2 pg — ABNORMAL LOW (ref 26.0–34.0)
MCHC: 30.2 g/dL (ref 30.0–36.0)
MCV: 80.2 fL (ref 80.0–100.0)
Platelets: 325 10*3/uL (ref 150–400)
RBC: 5.45 MIL/uL (ref 4.22–5.81)
RDW: 16.1 % — ABNORMAL HIGH (ref 11.5–15.5)
WBC: 5.3 10*3/uL (ref 4.0–10.5)
nRBC: 0 % (ref 0.0–0.2)

## 2021-10-16 SURGERY — DEBRIDEMENT, WOUND, WITH CLOSURE
Anesthesia: General | Site: Thigh | Laterality: Bilateral

## 2021-10-16 MED ORDER — LIDOCAINE 2% (20 MG/ML) 5 ML SYRINGE
INTRAMUSCULAR | Status: AC
  Filled 2021-10-16: qty 5

## 2021-10-16 MED ORDER — CHLORHEXIDINE GLUCONATE 0.12 % MT SOLN
15.0000 mL | Freq: Once | OROMUCOSAL | Status: AC
  Administered 2021-10-16: 15 mL via OROMUCOSAL
  Filled 2021-10-16: qty 15

## 2021-10-16 MED ORDER — CHLORHEXIDINE GLUCONATE CLOTH 2 % EX PADS: 6.0000 | MEDICATED_PAD | Freq: Once | CUTANEOUS | Status: DC

## 2021-10-16 MED ORDER — ORAL CARE MOUTH RINSE: 15.0000 mL | Freq: Once | OROMUCOSAL | Status: AC

## 2021-10-16 MED ORDER — PROPOFOL 10 MG/ML IV BOLUS
INTRAVENOUS | Status: AC
  Filled 2021-10-16: qty 20

## 2021-10-16 MED ORDER — MIDAZOLAM HCL 2 MG/2ML IJ SOLN
INTRAMUSCULAR | Status: AC
  Filled 2021-10-16: qty 2

## 2021-10-16 MED ORDER — FENTANYL CITRATE (PF) 250 MCG/5ML IJ SOLN
INTRAMUSCULAR | Status: AC
  Filled 2021-10-16: qty 5

## 2021-10-16 MED ORDER — LACTATED RINGERS IV SOLN: INTRAVENOUS | Status: DC

## 2021-10-16 MED ORDER — CEFAZOLIN SODIUM-DEXTROSE 2-4 GM/100ML-% IV SOLN
2.0000 g | INTRAVENOUS | Status: DC
  Filled 2021-10-16: qty 100

## 2021-10-16 MED ORDER — DEXAMETHASONE SODIUM PHOSPHATE 10 MG/ML IJ SOLN
INTRAMUSCULAR | Status: AC
  Filled 2021-10-16: qty 1

## 2021-10-16 NOTE — Interval H&P Note (Signed)
History and Physical Interval Note: ? ?10/16/2021 ?11:24 AM ? ?Wayne Hunter  has presented today for surgery, with the diagnosis of Bilateral Thigh Wounds.  The various methods of treatment have been discussed with the patient and family. After consideration of risks, benefits and other options for treatment, the patient has consented to  Procedure(s): ?DEBRIDEMENT AND CLOSURE WOUND (Bilateral) ?SKIN GRAFT SPLIT THICKNESS (Bilateral) as a surgical intervention.  The patient's history has been reviewed, patient examined, no change in status, stable for surgery.  I have reviewed the patient's chart and labs.  Questions were answered to the patient's satisfaction.   ? ? ?Wayne Hunter ? ? ?

## 2021-10-16 NOTE — Progress Notes (Signed)
No surgery needed today per Dr. Marla Roe. Dressings changed at bedside by Dr. Marla Roe. Patient will be discharged home with wife. All belongings returned.  ?

## 2021-10-16 NOTE — Anesthesia Preprocedure Evaluation (Signed)
Anesthesia Evaluation  ?Patient identified by MRN, date of birth, ID band ?Patient awake ? ? ? ?Reviewed: ?Allergy & Precautions, H&P , NPO status , Patient's Chart, lab work & pertinent test results ? ?Airway ?Mallampati: II ? ? ?Neck ROM: full ? ? ? Dental ?  ?Pulmonary ?neg pulmonary ROS,  ?  ?breath sounds clear to auscultation ? ? ? ? ? ? Cardiovascular ?+ DVT  ? ?Rhythm:regular Rate:Normal ? ? ?  ?Neuro/Psych ?  ? GI/Hepatic ?  ?Endo/Other  ? ? Renal/GU ?  ? ?  ?Musculoskeletal ? ? Abdominal ?  ?Peds ? Hematology ?  ?Anesthesia Other Findings ? ? Reproductive/Obstetrics ? ?  ? ? ? ? ? ? ? ? ? ? ? ? ? ?  ?  ? ? ? ? ? ? ? ? ?Anesthesia Physical ?Anesthesia Plan ? ?ASA: 2 ? ?Anesthesia Plan: General  ? ?Post-op Pain Management:   ? ?Induction: Intravenous ? ?PONV Risk Score and Plan: 2 and Ondansetron, Dexamethasone, Midazolam and Treatment may vary due to age or medical condition ? ?Airway Management Planned: Oral ETT ? ?Additional Equipment:  ? ?Intra-op Plan:  ? ?Post-operative Plan: Extubation in OR ? ?Informed Consent: I have reviewed the patients History and Physical, chart, labs and discussed the procedure including the risks, benefits and alternatives for the proposed anesthesia with the patient or authorized representative who has indicated his/her understanding and acceptance.  ? ? ? ?Dental advisory given ? ?Plan Discussed with: CRNA, Anesthesiologist and Surgeon ? ?Anesthesia Plan Comments:   ? ? ? ? ? ? ?Anesthesia Quick Evaluation ? ?

## 2021-10-17 ENCOUNTER — Telehealth: Payer: Self-pay

## 2021-10-17 NOTE — Telephone Encounter (Signed)
Faxed wound care order on 3/9 and 3/14 to [facility] along with demographics, license, workers [employer], most recent ov note; received confirmation fax on 3/14 and 4/4 from [facility]. Documents forwarded to batch scanning dept.  ?

## 2021-10-25 ENCOUNTER — Ambulatory Visit (INDEPENDENT_AMBULATORY_CARE_PROVIDER_SITE_OTHER): Payer: PRIVATE HEALTH INSURANCE | Admitting: Surgical

## 2021-10-25 ENCOUNTER — Encounter: Payer: Self-pay | Admitting: Surgical

## 2021-10-25 DIAGNOSIS — S7712XA Crushing injury of left thigh, initial encounter: Secondary | ICD-10-CM | POA: Diagnosis not present

## 2021-10-25 DIAGNOSIS — S7711XA Crushing injury of right thigh, initial encounter: Secondary | ICD-10-CM | POA: Diagnosis not present

## 2021-10-25 NOTE — Progress Notes (Signed)
? ?  Referring Provider ?No referring provider defined for this encounter.  ? ?CC: No chief complaint on file. ?   ? ?Wayne Hunter is an 26 y.o. male.  ?HPI: 26 year old male with bilateral thigh wounds after trauma.  He was scheduled for surgery on 10/16/2021, however this was canceled.  He is here with his wife and case Production designer, theatre/television/film. ? ?He is doing well.  He has continued with Adaptic, K-Y jelly dressing changes daily to the bilateral thigh wounds.  Pain is improving. ? ?He is still walking with his walker. ? ?Review of Systems ?General: No fevers or chills ? ?Physical Exam ? ?  10/16/2021  ?  9:19 AM 10/16/2021  ?  9:12 AM 09/26/2021  ?  4:15 PM  ?Vitals with BMI  ?Height 5\' 6"     ?Weight 189 lbs 10 oz    ?BMI 30.62    ?Systolic  121 117  ?Diastolic  70 71  ?Pulse  70 79  ?  ?General:  No acute distress,  Alert and oriented, Non-Toxic, Normal speech and affect.  Ambulating with walker ?Right thigh: Right thigh wound is 9 x 1.5 x 0.2 cm.  There is no surrounding erythema.  Good base of granulation tissue.  New epithelialization noted.  No foul odor is noted.  No tenderness to palpation.  No purulent drainage noted. ? ?Left eye: Left thigh wound has completely epithelialized.  There is a thin layer of epithelium over the central portion.  No surrounding erythema.  No tenderness to palpation. ? ?Assessment/Plan ?26 year old male status post bilateral thigh crush injury.  Left thigh wound has completely epithelialized.  Recommend applying Vaseline to this area and covering with Mepilex border for the next 1 week to protect the new epithelium until it has more tensile strength. ? ?Recommend continuing with K-Y jelly dressing change to the right thigh wound daily. ? ?Referral to PT placed for evaluation of baseline strength and improvement in range of motion and strengthening/ conditioning. ? ?Recommend following up in 3 weeks for reevaluation with Dr. 30 and discuss further surgical planning for improving shape and  contour. ? ?Pictures were obtained of the patient and placed in the chart with the patient's or guardian's permission. ? ?Time based coding: 30 minutes total was spent on care for patient. Greater than 50% was spent on counseling and cordination of care.  ? ? ?Ulice Bold Abby Tucholski ?10/25/2021, 8:20 AM  ? ? ?    ?

## 2021-11-01 ENCOUNTER — Other Ambulatory Visit (HOSPITAL_COMMUNITY): Payer: Self-pay

## 2021-11-04 ENCOUNTER — Telehealth: Payer: Self-pay | Admitting: *Deleted

## 2021-11-04 ENCOUNTER — Other Ambulatory Visit (HOSPITAL_COMMUNITY): Payer: Self-pay

## 2021-11-04 MED ORDER — APIXABAN 5 MG PO TABS
5.0000 mg | ORAL_TABLET | Freq: Two times a day (BID) | ORAL | 5 refills | Status: AC
  Filled 2021-11-04: qty 60, 30d supply, fill #0

## 2021-11-04 NOTE — Telephone Encounter (Signed)
Patient called for refill on Eliquis.  On office visit March 23,2023 prescription did not go through. Resent prescription to pharmacy. ?

## 2021-11-08 ENCOUNTER — Other Ambulatory Visit (HOSPITAL_COMMUNITY): Payer: Self-pay

## 2021-11-12 ENCOUNTER — Encounter: Payer: Self-pay | Admitting: Plastic Surgery

## 2021-11-12 ENCOUNTER — Ambulatory Visit (INDEPENDENT_AMBULATORY_CARE_PROVIDER_SITE_OTHER): Payer: PRIVATE HEALTH INSURANCE | Admitting: Plastic Surgery

## 2021-11-12 DIAGNOSIS — S7712XA Crushing injury of left thigh, initial encounter: Secondary | ICD-10-CM

## 2021-11-12 DIAGNOSIS — S7711XA Crushing injury of right thigh, initial encounter: Secondary | ICD-10-CM

## 2021-11-12 NOTE — Progress Notes (Signed)
? ?  Subjective:  ? ? Patient ID: PASTOR SGRO, male    DOB: May 21, 1996, 26 y.o.   MRN: [id number] ? ?The patient is a 26 year old male here for follow-up after multiple operations to his bilateral thighs.  He had a crush injury to the upper thighs and was originally treated by orthopedics.  We got involved in January.  He had multiple procedures for debridement and placement of skin substitutes for acceleration of healing.  Today he is completely healed and doing really well.  He has a divot in the skin on both sides but the right side is worse.  This may need to be addressed because I am concerned that it will not be easy to keep the area clean.  He still has some firmness from normal healing.  I like to give him a little more time to see if this will resolve before we try to intervene.  He is still walking with crutches.  His job is in Aeronautical engineer. ? ? ? ? ?Review of Systems  ?Constitutional:  Positive for activity change. Negative for appetite change.  ?Eyes: Negative.   ?Respiratory: Negative.  Negative for chest tightness and shortness of breath.   ?Cardiovascular:  Positive for leg swelling.  ?Gastrointestinal: Negative.   ?Endocrine: Negative.   ?Genitourinary: Negative.   ?Musculoskeletal:  Positive for gait problem.  ?Skin:  Positive for wound.  ? ?   ?Objective:  ? Physical Exam ?Vitals reviewed.  ?Constitutional:   ?   Appearance: Normal appearance.  ?HENT:  ?   Head: Normocephalic and atraumatic.  ?Cardiovascular:  ?   Rate and Rhythm: Normal rate.  ?   Pulses: Normal pulses.  ?Pulmonary:  ?   Effort: Pulmonary effort is normal.  ?Musculoskeletal:     ?   General: Swelling, tenderness, deformity and signs of injury present.  ?Skin: ?   General: Skin is warm.  ?   Coloration: Skin is not pale.  ?   Findings: Lesion present. No erythema.  ?Neurological:  ?   Mental Status: He is alert and oriented to person, place, and time.  ?Psychiatric:     ?   Mood and Affect: Mood normal.     ?   Behavior: Behavior  normal.     ?   Thought Content: Thought content normal.  ? ? ?   ?Assessment & Plan:  ? ?  ICD-10-CM   ?1. Crush injury, thigh, left, initial encounter  S77.12XA   ?  ?2. Crush injury, thigh, right, initial encounter  S77.11XA   ?  ? ?Pictures were obtained of the patient and placed in the chart with the patient's or guardian's permission. ? ?Recommend 1 more month off work due to the nature of his job.  Vaseline to the thighs with dry dressings.  Follow-up in 1 month for reevaluation and possibly intervention in the OR.  Patient is in agreement, Child psychotherapist spoken to. ?

## 2021-12-03 ENCOUNTER — Telehealth: Payer: Self-pay | Admitting: *Deleted

## 2021-12-03 NOTE — Telephone Encounter (Signed)
Received on (11/27/21) via of fax Plan of Care from [facility] PT requesting signature and return.  Given to provider to sign.    Plan of Care signed and faxed back to [facility].  Confirmation received and copy scanned into the chart.//AB/CMA

## 2021-12-03 NOTE — Telephone Encounter (Signed)
Physical therapy plan of care received from [facility] PT, signed by Dr. Ulice Bold, and faxed to [phone]. Fax confirmation received and original document sent for batch scanning.

## 2021-12-04 ENCOUNTER — Telehealth: Payer: Self-pay

## 2021-12-04 ENCOUNTER — Other Ambulatory Visit (HOSPITAL_COMMUNITY): Payer: Self-pay

## 2021-12-04 NOTE — Telephone Encounter (Signed)
Patient called wanting refill of Eliquis sent to [facility] pharmacy.  I advised patient that he already has 4-5 refills and Pharmacist said he never picked it up.  Pt verbalized understanding.

## 2021-12-19 ENCOUNTER — Telehealth: Payer: Self-pay | Admitting: *Deleted

## 2021-12-19 NOTE — Telephone Encounter (Signed)
Received on (12/16/2021) via of fax Plan of Care from [facility] PT requesting signature and day and return.  Given to provider to sign and return.     Plan of Care signed and faxed back to [facility].  Confirmation received and copy scanned into the chart.//AB/CMA

## 2021-12-20 ENCOUNTER — Encounter: Payer: Self-pay | Admitting: Plastic Surgery

## 2021-12-20 ENCOUNTER — Ambulatory Visit (INDEPENDENT_AMBULATORY_CARE_PROVIDER_SITE_OTHER): Payer: PRIVATE HEALTH INSURANCE | Admitting: Student

## 2021-12-20 DIAGNOSIS — S7712XD Crushing injury of left thigh, subsequent encounter: Secondary | ICD-10-CM | POA: Diagnosis not present

## 2021-12-20 DIAGNOSIS — S7711XD Crushing injury of right thigh, subsequent encounter: Secondary | ICD-10-CM

## 2021-12-20 NOTE — Progress Notes (Signed)
   Referring Provider No referring provider defined for this encounter.   CC:  Chief Complaint  Patient presents with   Follow-up      Wayne Hunter is an 26 y.o. male.  HPI:  Patient is a 26 year old male with past history of bilateral traumatic thigh wounds due to crush injury.  Patient underwent debridement of the wounds and wound VAC placement to the left thigh wound with Dr. Ulice Bold on 07/18/2021.  Patient returned to the OR with Dr. Ulice Bold on 07/22/2021 for placement of wound matrix and wound vacs bilaterally.  Patient underwent several subsequent surgeries for wound matrix placement and wound VAC placements.  Patient was last seen in the clinic on 11/12/2021.  At this visit, he was doing well and his wounds were completely healed.  He was found to still have a divot in the skin on both sides.  Plan for him to follow-up in 1 month to reevaluate the area.  Today, patient reports he is doing well.  He is accompanied by his significant other.  He denies any new concerns or complaints.  Patient reports that he is able to keep the now-healed wound sites clean and that they do not really bother him.  He reports that he has been going to physical therapy and has been slowly regaining his strength.  He reports he does not feel he really needs his crutch anymore.  He states he feels a lot stronger since the trauma but he does not feel like he is back at his baseline prior to the trauma.  Review of Systems General: No changes interim health  Skin: Denies any drainage or erythema from wound sites   Physical Exam    10/16/2021    9:19 AM 10/16/2021    9:12 AM 09/26/2021    4:15 PM  Vitals with BMI  Height 5\' 6"     Weight 189 lbs 10 oz    BMI 30.62    Systolic  121 117  Diastolic  70 71  Pulse  70 79    General:  No acute distress,  Alert and oriented, Non-Toxic, Normal speech and affect Skin: The wounds to the bilateral thighs appear to be healed.  There is no drainage or erythema.   There is a divot in the skin on both thighs.  Patient was able to ambulate in the exam room without crutch.    Assessment/Plan  Crush injury, thigh, left, subsequent encounter (ICD10: S77.12XD) Crush injury, thigh, right, subsequent encounter (ICD10: S77.11XD)   Discussed with patient and patient's case manager, , that patient may return to work on light duty.  Patient to continue physical therapy.  Patient may drive.  Discussed with patient that no dressings are needed over the now-healed wound sites and that he may leave them open to air.  Discussed with patient that revision of the healed-wound areas may need surgical revision in approximately 3 months.  Discussed with patient that he can follow-up with [facility] in 2 months and we can reevaluate him at that time.  Told patient to call if he has any questions or concerns.  Pictures were obtained and placed in the patient's chart with the patient's permission.  I discussed the objective findings and plan with Dr. [place].  Dr. Ulice Bold in agreement with the plan.   Ulice Bold 12/20/2021, 11:06 AM

## 2021-12-31 ENCOUNTER — Telehealth: Payer: Self-pay | Admitting: *Deleted

## 2021-12-31 NOTE — Telephone Encounter (Signed)
Received call from [facility] requesting order/referral for 12 visits for work conditioning for pt. Also received fax with office notes for pt. Message and office notes forwarded to Caroline More, PA-C.

## 2022-01-03 ENCOUNTER — Telehealth: Payer: Self-pay | Admitting: *Deleted

## 2022-01-06 ENCOUNTER — Telehealth: Payer: Self-pay | Admitting: *Deleted

## 2022-01-06 NOTE — Telephone Encounter (Signed)
Written Rx faxed to [facility] for PT visits per prior phone conversation with fax confirmation received

## 2022-01-07 ENCOUNTER — Telehealth: Payer: Self-pay | Admitting: *Deleted

## 2022-01-13 ENCOUNTER — Telehealth: Payer: Self-pay | Admitting: *Deleted

## 2022-01-13 NOTE — Telephone Encounter (Signed)
Received on (01/13/22) via of fax Plan of Care from [facility].  Requesting signature and fax back.  Given to provider to sign and return.     Plan of Care signed and faxed back to [facility].  Confirmation received and copy scanned into the chart.//AB/CMA

## 2022-01-20 ENCOUNTER — Telehealth: Payer: Self-pay | Admitting: *Deleted

## 2022-01-20 NOTE — Telephone Encounter (Signed)
Received on (01/20/2022) via of fax Plan of Care from [facility] requesting signature and return.  Given to provider to sign and return.  Plan of Care signed and faxed back to [facility].  Confirmation received and copy scanned into the chart.//AB/CMA

## 2022-01-30 ENCOUNTER — Telehealth: Payer: Self-pay | Admitting: *Deleted

## 2022-01-30 NOTE — Telephone Encounter (Signed)
Received on (11/05/2021) via of fax Plan of Care from [facility] PT requesting signature and return.  Given to provider to sign.  Plan of Care signed and faxed back to [facility].  Confirmation received and copy scanned into the chart.//AB/CMA

## 2022-02-17 ENCOUNTER — Telehealth: Payer: Self-pay | Admitting: *Deleted

## 2022-02-17 NOTE — Telephone Encounter (Signed)
Received on (02/13/2022) via of fax Plan of Care from [facility] PT requesting signature and return.  Given to provider to sign.    Plan of Care signed and faxed back to [facility].  Confirmation received and copy scanned into the chart.//AB/CMA

## 2022-02-21 ENCOUNTER — Ambulatory Visit: Payer: Medicaid Other | Admitting: Plastic Surgery

## 2022-03-21 ENCOUNTER — Encounter: Payer: Self-pay | Admitting: Plastic Surgery

## 2022-03-21 ENCOUNTER — Ambulatory Visit (INDEPENDENT_AMBULATORY_CARE_PROVIDER_SITE_OTHER): Payer: PRIVATE HEALTH INSURANCE | Admitting: Plastic Surgery

## 2022-03-21 DIAGNOSIS — S7712XD Crushing injury of left thigh, subsequent encounter: Secondary | ICD-10-CM | POA: Diagnosis not present

## 2022-03-21 DIAGNOSIS — S7711XD Crushing injury of right thigh, subsequent encounter: Secondary | ICD-10-CM

## 2022-03-21 DIAGNOSIS — S7712XA Crushing injury of left thigh, initial encounter: Secondary | ICD-10-CM

## 2022-03-21 DIAGNOSIS — S7711XA Crushing injury of right thigh, initial encounter: Secondary | ICD-10-CM
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
## 2022-04-03 ENCOUNTER — Ambulatory Visit: Payer: Medicaid Other | Admitting: Vascular Surgery

## 2022-04-03 ENCOUNTER — Encounter (HOSPITAL_COMMUNITY): Payer: Medicaid Other

## 2022-04-10 ENCOUNTER — Ambulatory Visit (HOSPITAL_COMMUNITY)
Admission: RE | Admit: 2022-04-10 | Discharge: 2022-04-10 | Disposition: A | Discharge status: Home / Self Care | Payer: Medicaid Other | Source: Ambulatory Visit | Attending: Vascular Surgery | Admitting: Vascular Surgery

## 2022-04-10 ENCOUNTER — Ambulatory Visit (INDEPENDENT_AMBULATORY_CARE_PROVIDER_SITE_OTHER): Payer: Medicaid Other | Admitting: Vascular Surgery

## 2022-04-10 ENCOUNTER — Encounter: Payer: Self-pay | Admitting: Vascular Surgery

## 2022-04-10 VITALS — BP 116/79 | HR 103 | Temp 98.5°F | Resp 20 | Ht 66.0 in | Wt 248.0 lb

## 2022-04-10 DIAGNOSIS — I82412 Acute embolism and thrombosis of left femoral vein: Secondary | ICD-10-CM | POA: Insufficient documentation
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
## 2022-04-23 ENCOUNTER — Telehealth: Payer: Self-pay | Admitting: Plastic Surgery

## 2022-04-23 NOTE — Telephone Encounter (Signed)
Faxed requested office note of 03/21/22 to CCM/ Case Management.

## 2022-05-29 ENCOUNTER — Encounter: Payer: Self-pay | Admitting: Surgical
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
## 2022-09-19 ENCOUNTER — Ambulatory Visit: Payer: Medicaid Other | Admitting: Plastic Surgery
